# Patient Record
Sex: Male | Born: 1955 | Race: White | Hispanic: No | Marital: Married | Smoking: Former smoker
Health system: Southern US, Community
[De-identification: ages and names within clinical notes are randomized; demographics above are authoritative.]

## PROBLEM LIST (undated history)

## (undated) DIAGNOSIS — R002 Palpitations: Secondary | ICD-10-CM

## (undated) DIAGNOSIS — Z951 Presence of aortocoronary bypass graft: Secondary | ICD-10-CM

## (undated) DIAGNOSIS — K219 Gastro-esophageal reflux disease without esophagitis: Secondary | ICD-10-CM

## (undated) DIAGNOSIS — I495 Sick sinus syndrome: Secondary | ICD-10-CM

## (undated) DIAGNOSIS — R001 Bradycardia, unspecified: Secondary | ICD-10-CM

## (undated) DIAGNOSIS — N529 Male erectile dysfunction, unspecified: Secondary | ICD-10-CM

## (undated) DIAGNOSIS — I5022 Chronic systolic (congestive) heart failure: Secondary | ICD-10-CM

## (undated) DIAGNOSIS — I219 Acute myocardial infarction, unspecified: Secondary | ICD-10-CM

## (undated) DIAGNOSIS — F319 Bipolar disorder, unspecified: Secondary | ICD-10-CM

## (undated) DIAGNOSIS — G4733 Obstructive sleep apnea (adult) (pediatric): Secondary | ICD-10-CM

## (undated) DIAGNOSIS — I10 Essential (primary) hypertension: Secondary | ICD-10-CM

## (undated) DIAGNOSIS — E785 Hyperlipidemia, unspecified: Secondary | ICD-10-CM

## (undated) DIAGNOSIS — Z8719 Personal history of other diseases of the digestive system: Secondary | ICD-10-CM

## (undated) DIAGNOSIS — E782 Mixed hyperlipidemia: Secondary | ICD-10-CM

## (undated) DIAGNOSIS — Z950 Presence of cardiac pacemaker: Secondary | ICD-10-CM

## (undated) DIAGNOSIS — I2510 Atherosclerotic heart disease of native coronary artery without angina pectoris: Secondary | ICD-10-CM

## (undated) DIAGNOSIS — J189 Pneumonia, unspecified organism: Secondary | ICD-10-CM

## (undated) DIAGNOSIS — IMO0002 Reserved for concepts with insufficient information to code with codable children: Secondary | ICD-10-CM

## (undated) DIAGNOSIS — I4891 Unspecified atrial fibrillation: Secondary | ICD-10-CM

## (undated) DIAGNOSIS — R9430 Abnormal result of cardiovascular function study, unspecified: Secondary | ICD-10-CM

## (undated) DIAGNOSIS — J45909 Unspecified asthma, uncomplicated: Secondary | ICD-10-CM

## (undated) DIAGNOSIS — R079 Chest pain, unspecified: Secondary | ICD-10-CM

## (undated) HISTORY — DX: Palpitations: R00.2

## (undated) HISTORY — DX: Presence of aortocoronary bypass graft: Z95.1

## (undated) HISTORY — PX: BACK SURGERY: SHX140

## (undated) HISTORY — DX: Bradycardia, unspecified: R00.1

## (undated) HISTORY — DX: Unspecified atrial fibrillation: I48.91

## (undated) HISTORY — DX: Male erectile dysfunction, unspecified: N52.9

## (undated) HISTORY — DX: Sick sinus syndrome: I49.5

## (undated) HISTORY — DX: Abnormal result of cardiovascular function study, unspecified: R94.30

## (undated) HISTORY — PX: CARDIAC CATHETERIZATION: SHX172

## (undated) HISTORY — DX: Unspecified asthma, uncomplicated: J45.909

## (undated) HISTORY — DX: Essential (primary) hypertension: I10

## (undated) HISTORY — PX: LUMBAR DISC SURGERY: SHX700

## (undated) HISTORY — PX: MAXIMUM ACCESS (MAS)POSTERIOR LUMBAR INTERBODY FUSION (PLIF) 1 LEVEL: SHX6368

## (undated) HISTORY — DX: Chronic systolic (congestive) heart failure: I50.22

## (undated) HISTORY — DX: Hyperlipidemia, unspecified: E78.5

## (undated) HISTORY — DX: Mixed hyperlipidemia: E78.2

## (undated) HISTORY — DX: Atherosclerotic heart disease of native coronary artery without angina pectoris: I25.10

## (undated) HISTORY — DX: Reserved for concepts with insufficient information to code with codable children: IMO0002

## (undated) HISTORY — DX: Chest pain, unspecified: R07.9

## (undated) HISTORY — PX: CORONARY ANGIOPLASTY: SHX604

## (undated) HISTORY — DX: Bipolar disorder, unspecified: F31.9

## (undated) SURGERY — Surgical Case
Anesthesia: *Unknown

---
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
## 1979-05-18 DIAGNOSIS — Z8711 Personal history of peptic ulcer disease: Secondary | ICD-10-CM

## 1979-05-18 HISTORY — DX: Personal history of peptic ulcer disease: Z87.11

## 2000-04-10 ENCOUNTER — Ambulatory Visit (HOSPITAL_COMMUNITY): Admission: RE | Admit: 2000-04-10 | Discharge: 2000-04-10 | Payer: Self-pay | Admitting: Neurosurgery

## 2000-04-10 ENCOUNTER — Encounter: Payer: Self-pay | Admitting: Neurosurgery

## 2000-04-24 ENCOUNTER — Ambulatory Visit (HOSPITAL_COMMUNITY): Admission: RE | Admit: 2000-04-24 | Discharge: 2000-04-24 | Payer: Self-pay | Admitting: Neurosurgery

## 2000-04-24 ENCOUNTER — Encounter: Payer: Self-pay | Admitting: Neurosurgery

## 2000-07-15 ENCOUNTER — Encounter: Payer: Self-pay | Admitting: Neurosurgery

## 2000-07-16 ENCOUNTER — Encounter: Payer: Self-pay | Admitting: Neurosurgery

## 2000-07-16 ENCOUNTER — Observation Stay (HOSPITAL_COMMUNITY): Admission: RE | Admit: 2000-07-16 | Discharge: 2000-07-17 | Payer: Self-pay | Admitting: Neurosurgery

## 2000-08-14 ENCOUNTER — Encounter: Payer: Self-pay | Admitting: Neurosurgery

## 2000-08-14 ENCOUNTER — Encounter: Admission: RE | Admit: 2000-08-14 | Discharge: 2000-08-14 | Payer: Self-pay | Admitting: Neurosurgery

## 2000-11-04 ENCOUNTER — Ambulatory Visit (HOSPITAL_COMMUNITY): Admission: RE | Admit: 2000-11-04 | Discharge: 2000-11-04 | Payer: Self-pay | Admitting: Neurosurgery

## 2000-11-06 ENCOUNTER — Encounter: Payer: Self-pay | Admitting: Neurosurgery

## 2000-11-06 ENCOUNTER — Ambulatory Visit (HOSPITAL_COMMUNITY): Admission: RE | Admit: 2000-11-06 | Discharge: 2000-11-06 | Payer: Self-pay | Admitting: Neurosurgery

## 2000-12-03 ENCOUNTER — Ambulatory Visit (HOSPITAL_COMMUNITY): Admission: RE | Admit: 2000-12-03 | Discharge: 2000-12-03 | Payer: Self-pay | Admitting: Neurosurgery

## 2000-12-03 ENCOUNTER — Encounter: Payer: Self-pay | Admitting: Neurosurgery

## 2002-02-01 ENCOUNTER — Encounter: Payer: Self-pay | Admitting: Neurosurgery

## 2002-02-03 ENCOUNTER — Encounter: Payer: Self-pay | Admitting: Neurosurgery

## 2002-02-03 ENCOUNTER — Inpatient Hospital Stay (HOSPITAL_COMMUNITY): Admission: RE | Admit: 2002-02-03 | Discharge: 2002-02-06 | Payer: Self-pay | Admitting: Neurosurgery

## 2002-07-28 ENCOUNTER — Encounter: Admission: RE | Admit: 2002-07-28 | Discharge: 2002-07-28 | Payer: Self-pay | Admitting: Neurosurgery

## 2004-09-16 DIAGNOSIS — I2510 Atherosclerotic heart disease of native coronary artery without angina pectoris: Secondary | ICD-10-CM

## 2004-09-16 DIAGNOSIS — I219 Acute myocardial infarction, unspecified: Secondary | ICD-10-CM

## 2004-09-16 HISTORY — DX: Acute myocardial infarction, unspecified: I21.9

## 2004-09-16 HISTORY — PX: CORONARY ARTERY BYPASS GRAFT: SHX141

## 2004-09-16 HISTORY — DX: Atherosclerotic heart disease of native coronary artery without angina pectoris: I25.10

## 2008-05-30 ENCOUNTER — Ambulatory Visit: Payer: Self-pay | Admitting: Cardiology

## 2008-09-23 ENCOUNTER — Encounter: Payer: Self-pay | Admitting: Cardiology

## 2008-10-11 ENCOUNTER — Ambulatory Visit: Payer: Self-pay | Admitting: Cardiology

## 2008-10-18 ENCOUNTER — Ambulatory Visit: Payer: Self-pay | Admitting: Cardiology

## 2008-11-23 ENCOUNTER — Ambulatory Visit: Payer: Self-pay | Admitting: Cardiology

## 2009-01-06 ENCOUNTER — Ambulatory Visit: Payer: Self-pay | Admitting: Cardiology

## 2009-02-15 ENCOUNTER — Encounter: Payer: Self-pay | Admitting: Cardiology

## 2009-04-24 ENCOUNTER — Ambulatory Visit: Payer: Self-pay | Admitting: Cardiology

## 2009-04-25 ENCOUNTER — Encounter: Payer: Self-pay | Admitting: Cardiology

## 2009-06-10 ENCOUNTER — Ambulatory Visit: Payer: Self-pay | Admitting: Cardiology

## 2009-06-14 ENCOUNTER — Inpatient Hospital Stay (HOSPITAL_BASED_OUTPATIENT_CLINIC_OR_DEPARTMENT_OTHER): Admission: RE | Admit: 2009-06-14 | Discharge: 2009-06-14 | Payer: Self-pay | Admitting: Cardiology

## 2009-06-14 ENCOUNTER — Ambulatory Visit: Payer: Self-pay | Admitting: Cardiology

## 2009-06-14 ENCOUNTER — Encounter: Payer: Self-pay | Admitting: Cardiology

## 2009-06-16 ENCOUNTER — Encounter: Payer: Self-pay | Admitting: Cardiology

## 2009-06-29 ENCOUNTER — Telehealth (INDEPENDENT_AMBULATORY_CARE_PROVIDER_SITE_OTHER): Payer: Self-pay | Admitting: *Deleted

## 2009-07-05 ENCOUNTER — Ambulatory Visit: Payer: Self-pay | Admitting: Cardiology

## 2009-07-05 DIAGNOSIS — J45909 Unspecified asthma, uncomplicated: Secondary | ICD-10-CM | POA: Insufficient documentation

## 2009-07-15 ENCOUNTER — Ambulatory Visit: Payer: Self-pay | Admitting: Cardiology

## 2009-07-24 ENCOUNTER — Encounter: Payer: Self-pay | Admitting: Cardiology

## 2009-08-03 ENCOUNTER — Encounter (INDEPENDENT_AMBULATORY_CARE_PROVIDER_SITE_OTHER): Payer: Self-pay | Admitting: *Deleted

## 2009-11-14 ENCOUNTER — Encounter: Admission: RE | Admit: 2009-11-14 | Discharge: 2009-11-14 | Payer: Self-pay | Admitting: Family Medicine

## 2010-05-14 ENCOUNTER — Ambulatory Visit: Payer: Self-pay | Admitting: Family Medicine

## 2010-05-14 DIAGNOSIS — J309 Allergic rhinitis, unspecified: Secondary | ICD-10-CM

## 2010-05-14 HISTORY — DX: Allergic rhinitis, unspecified: J30.9

## 2010-06-02 ENCOUNTER — Telehealth: Payer: Self-pay | Admitting: Physician Assistant

## 2010-06-04 ENCOUNTER — Telehealth: Payer: Self-pay | Admitting: Physician Assistant

## 2010-10-18 NOTE — Progress Notes (Signed)
  Phone Note Other Incoming   Caller: spouse,nadine Summary of Call: wife called for spouse who she states is at work, states his Remus Loffler is out, she has requested that the pharmacy send in refill request, and also states she spoke to someone in the office yesterday, but got no response. Cal;l to pharmacy [facility], pt last got ambien 10mg  04/23/2010 from [facility] in [place], not Dr Janace Litten who wifee staes he got his scriptsfrom.  I called in two tabs only of ambien 10mg  one at night, to [facility], and will request responsinle provider to follow through on this Initial call taken by: Syliva Overman MD,  June 02, 2010 6:14 PM  Follow-up for Phone Call        pls see note, and address the request for ambien from this office as you deem appropriate Follow-up by: Syliva Overman MD,  June 02, 2010 6:15 PM  Additional Follow-up for Phone Call Additional follow up Details #1::        See 06-04-10 phone note. Additional Follow-up by: Esperanza Sheets PA,  June 05, 2010 1:25 PM

## 2010-10-18 NOTE — Progress Notes (Signed)
Summary: refill  Phone Note Call from Patient   Summary of Call: pts wife called again and needs to get his medicine refilled today. please call her. [phone]  cell [phone] Initial call taken by: Rudene Anda,  June 04, 2010 11:39 AM  Follow-up for Phone Call        I was under the impression that his mental health provider was prescribing his Ambien and Seroquel.   Follow-up by: Esperanza Sheets PA,  June 04, 2010 1:17 PM  Additional Follow-up for Phone Call Additional follow up Details #1::        called patient, left message Additional Follow-up by: Adella Hare LPN,  June 04, 2010 1:55 PM    Additional Follow-up for Phone Call Additional follow up Details #2::    wife called office upset wanting prescription advised that patient needs to call office reguarding this, there is no signed release to give her any information Follow-up by: Adella Hare LPN,  June 04, 2010 2:51 PM   Appended Document: refill patient wife stated maybe they would go their old doctor and hung up

## 2010-10-18 NOTE — Assessment & Plan Note (Signed)
Summary: new patient- room 1   Vital Signs:  Patient profile:   55 year old male Height:      67 inches Weight:      215.75 pounds BMI:     33.91 O2 Sat:      98 % on Room air Pulse rate:   78 / minute Resp:     16 per minute BP sitting:   130 / 88  (left arm)  Vitals Entered By: Adella Hare LPN (May 14, 2010 2:58 PM)  Nutrition Counseling: Patient's BMI is greater than 25 and therefore counseled on weight management options. CC: new patient Is Patient Diabetic? No Pain Assessment Patient in pain? no        Primary Provider:  Esperanza Sheets PA  CC:  new patient.  History of Present Illness: New pt here to establish care with new PCP. Feels that overall he is doing well.  No complaints or concerns today.  Hx of CAD.  Hx of CAGB. Cardiologist Dr Myrtis Ser. Overdue for 6 mos f/u.  Needs refill of Quinapril.  Hx of asthma. States no albuterol use in 4-5 yrs.  Has some seasonal allergy flare in the spring and ofen will develop a cough with this.  No difficulty breathing or wheezing.  No HS awakening.  He takes Zyrtec daily yr round for his allergies. He often uses over the counter nasal spray for nasal congestion & swelling though he knows he shouldnt.  Hx of alcoholism and prescription drug abuse.  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 Went to drug rehab & saw psych afterwards.  Is doing well emotionally.  Prev had problems with sleep but sleeps well now with Ambien and Seroquel.  Hx of colonoscopy 2 yrs ago. Td< 10 yrs.     Current Medications (verified): 1)  Metoprolol Tartrate 25 Mg Tabs (Metoprolol Tartrate) .[name].. 1 Tab Two Times A Day 2)  Nitrolingual 0.4 Mg/spray Soln (Nitroglycerin) .... Spray 1 Spray As Directed 3)  Quinapril Hcl 10 Mg Tabs (Quinapril Hcl) .... Take 1 Tablet By Mouth Once A Day  Place On File 4)  Advicor 500-20 Mg Xr24h-Tab (Niacin-Lovastatin) .... Take 1 Tablet By Mouth Once A Day 5)  Aspirin Ec 325 Mg Tbec (Aspirin)  .... Take One Tablet By Mouth Daily 6)  Zyrtec Allergy 10 Mg Tabs (Cetirizine Hcl) .... Take 1 Tablet By Mouth Once A Day 7)  Ambien 10 Mg Tabs (Zolpidem Tartrate) .... Take 1 Tab By Mouth At Bedtime' 8)  Seroquel 300 Mg Tabs (Quetiapine Fumarate) .... Take 1 Tab By Mouth At Bedtime 9)  Omeprazole 20 Mg Cpdr (Omeprazole) .... Take 1 Tablet By Mouth Once A Day 10)  Indomethacin Cr 75 Mg Cr-Caps (Indomethacin) .... One Cap By Mouth Two Times A Day With Food 11)  Colcrys 0.6 Mg Tabs (Colchicine) .... One Tab By Mouth Every Four Hours As Needed  Allergies (verified): 1)  ! Sulfa   Past History:  Past medical, surgical, family and social histories (including risk factors) reviewed, and no changes noted (except as noted below).  Past Medical History: HYPERLIPIDEMIA-MIXED (ICD-272.4) CAD, NATIVE VESSEL (ICD-414.01).catheterization.. June 14, 2009.[name] LIMA LAD patent.. SVG diagonal 40% proximal stenosis... slight anterolateral hypokinesis.. EF 50% EF 50%... cardiac catheter.. September, 2010 CABG.. 2006.[name] [place] HYPERTENSION, UNSPECIFIED (ICD-401.9) gout.  asthma.  erectile dysfunction Palpitations Hx of alcoholism and prescription drug abuse Bipolar d/o  Past Surgical History: Back Surgery x 4 CABG  Family History: Reviewed history from 06/14/2009 and  no changes required. mother deceased- COPD, lung cancer father living- heart dz two brothers living- HTN x1, Hyperlipidemia x1, DM x1 two sister livng- HTN x 1  Social History: Reviewed history and no changes required. Employed full time- [employer]  Married 7 One grown child Never Smoked Alcohol use-no Drug use-no Regular exercise-no Smoking Status:  never Drug Use:  no Does Patient Exercise:  no  Review of Systems CV:  Denies chest pain or discomfort and palpitations. Resp:  Denies cough and shortness of breath. GI:  Denies abdominal pain, change in bowel habits, indigestion, nausea, and vomiting.  Physical  Exam  General:  Well-developed,well-nourished,in no acute distress; alert,appropriate and cooperative throughout examination Head:  Normocephalic and atraumatic without obvious abnormalities. No apparent alopecia or balding. Ears:  External ear exam shows no significant lesions or deformities.  Otoscopic examination reveals clear canals, tympanic membranes are intact bilaterally without bulging, retraction, inflammation or discharge. Hearing is grossly normal bilaterally. Nose:  External nasal examination shows no deformity or inflammation. Nasal mucosa are pink and moist without lesions or exudates. Mouth:  Oral mucosa and oropharynx without lesions or exudates.  Neck:  No deformities, masses, or tenderness noted. Lungs:  Normal respiratory effort, chest expands symmetrically. Lungs are clear to auscultation, no crackles or wheezes. Heart:  Normal rate and regular rhythm. S1 and S2 normal without gallop, murmur, click, rub or other extra sounds. Cervical Nodes:  No lymphadenopathy noted Psych:  Cognition and judgment appear intact. Alert and cooperative with normal attention span and concentration. No apparent delusions, illusions, hallucinations   Impression & Recommendations:  Problem # 1:  HYPERTENSION, UNSPECIFIED (ICD-401.9) Assessment Comment Only  His updated medication list for this problem includes:    Metoprolol Tartrate 25 Mg Tabs (Metoprolol tartrate) .[name]... 1 tab two times a day    Quinapril Hcl 10 Mg Tabs (Quinapril hcl) .[name]... Take 1 tablet by mouth once a day  place on file  BP today: 130/88 Prior BP: 139/73 (07/05/2009)  Orders: T-CMP with estimated GFR ([address])  Problem # 2:  ALLERGIC RHINITIS (ICD-477.9) Assessment: Comment Only Advised to discontinue over the counter nasal sprays.  Discussed rebound effect from them.  His updated medication list for this problem includes:    Zyrtec Allergy 10 Mg Tabs (Cetirizine hcl) .[name]... Take 1 tablet by mouth once a day     Fluticasone Propionate 50 Mcg/act Susp (Fluticasone propionate) ..... Use 2 sprays each nostril once daily  Problem # 3:  CAD, NATIVE VESSEL (ICD-414.01) Assessment: Comment Only Pt will call cardiologist for f/u appt.   His updated medication list for this problem includes:    Metoprolol Tartrate 25 Mg Tabs (Metoprolol tartrate) .[name]... 1 tab two times a day    Nitrolingual 0.4 Mg/spray Soln (Nitroglycerin) ..... Spray 1 spray as directed    Quinapril Hcl 10 Mg Tabs (Quinapril hcl) .[name]... Take 1 tablet by mouth once a day  place on file    Aspirin Ec 325 Mg Tbec (Aspirin) .[name]... Take one tablet by mouth daily  Problem # 4:  ASTHMA, UNSPECIFIED, UNSPECIFIED STATUS (ICD-493.90) Assessment: Improved  Problem # 5:  TOBACCO ABUSE (ICD-305.1) Assessment: Comment Only Pt states he is cutting back and trying to quit.  Complete Medication List: 1)  Metoprolol Tartrate 25 Mg Tabs (Metoprolol tartrate) .[name].. 1 tab two times a day 2)  Nitrolingual 0.4 Mg/spray Soln (Nitroglycerin) .... Spray 1 spray as directed 3)  Quinapril Hcl 10 Mg Tabs (Quinapril hcl) .... Take 1 tablet by mouth once a  day  place on file 4)  Advicor 500-20 Mg Xr24h-tab (Niacin-lovastatin) .... Take 1 tablet by mouth once a day 5)  Aspirin Ec 325 Mg Tbec (Aspirin) .... Take one tablet by mouth daily 6)  Zyrtec Allergy 10 Mg Tabs (Cetirizine hcl) .... Take 1 tablet by mouth once a day 7)  Ambien 10 Mg Tabs (Zolpidem tartrate) .... Take 1 tab by mouth at bedtime' 8)  Seroquel 300 Mg Tabs (Quetiapine fumarate) .... Take 1 tab by mouth at bedtime 9)  Omeprazole 20 Mg Cpdr (Omeprazole) .... Take 1 tablet by mouth once a day 10)  Indomethacin Cr 75 Mg Cr-caps (Indomethacin) .... One cap by mouth two times a day with food 11)  Colcrys 0.6 Mg Tabs (Colchicine) .... One tab by mouth every four hours as needed 12)  Fluticasone Propionate 50 Mcg/act Susp (Fluticasone propionate) .... Use 2 sprays each nostril once daily  Other  Orders: T-Lipid Profile ([id number]) T-CBC No Diff ([id number]) T-TSH ([id number]) T-PSA ([id number])  Patient Instructions: 1)  Please schedule a follow-up appointment in 3 months. 2)  I have prescribed fluticasone nasal spray to use once daily for allergies. 3)  Continue your other medications as prescribed. Prescriptions: QUINAPRIL HCL 10 MG TABS (QUINAPRIL HCL) Take 1 tablet by mouth once a day  PLACE ON FILE  #30 x 0   Entered and Authorized by:   Esperanza Sheets PA   Signed by:   Esperanza Sheets PA on 05/14/2010   Method used:   Print then Give to Patient   RxID:   [id number] FLUTICASONE PROPIONATE 50 MCG/ACT SUSP (FLUTICASONE PROPIONATE) use 2 sprays each nostril once daily  #1 x 3   Entered and Authorized by:   Esperanza Sheets PA   Signed by:   Esperanza Sheets PA on 05/14/2010   Method used:   Print then Give to Patient   RxID:   [id number]

## 2011-01-29 NOTE — Assessment & Plan Note (Signed)
[facility]                          [facility] OFFICE NOTE   NAME:[name] [name]                  MRN:          [id number]  DATE:04/24/2009                            DOB:          [birth year]    [name] [name] is doing well.  He does have known coronary disease.  I saw  him last in April 2010.  He has an adenosine Cardiolite in February  2010.  There was question of some mild ischemia.  There was also  question of an increased t.i.d. ratio.  Ejection fraction was 50%.  We  have reviewed that results very carefully and decided to follow him  medically.  He is feeling well.  He does have some mild intermittent  chest discomfort, but this is chronic and unchanged.  He is going about  full activities.  Since his last office visit, the patient was admitted  to [facility] on February 15, 2009.  It appeared that his blood  pressure was low and it was probably a combination of dehydration and  need to lower his meds.  He was hydrated and his meds were adjusted and  he is doing great.   PAST MEDICAL HISTORY:   ALLERGIES:  SULFA.   MEDICATIONS:  See the flow sheet.   OTHER MEDICAL PROBLEMS:  See the complete list on my note of January 06, 2009.   REVIEW OF SYSTEMS:  The patient denies fevers, chills, headache, skin  rash, sweats, change in vision, change in hearing.  He is not having any  GI or GU symptoms.  He has some mild chronic low back pain.  All other  systems are reviewed and are negative.   PHYSICAL EXAMINATION:  VITAL SIGNS:  Blood pressure 110/74 with a pulse  of 71.  GENERAL:  The patient is oriented to person, time, and place.  Affect is  normal.  He is here with his wife today.  HEENT:  No xanthelasma.  He has normal extraocular motion.  There are no  carotid bruits.  There is no jugular venous distention.  LUNGS:  Clear.  Respiratory effort is not labored.  CARDIAC:  S1 with an S2.  There are no clicks or significant  murmurs.  ABDOMEN:  Soft.  He has no significant peripheral edema.   No labs were done today.   Problems are listed on my note of January 06, 2009:  #1.  Hypertension.  On his medications, he had some hypotension and the  doses were adjusted and he is stable now.  #10.  Coronary disease.  This is stable.  I will see him back in 6  months.  No change in his therapy as of today.     [name] Abed, MD, [facility]  Electronically Signed    JDK/MedQ  DD: 04/24/2009  DT: 04/25/2009  Job #: [id number]   cc:   Elvera Lennox

## 2011-01-29 NOTE — Assessment & Plan Note (Signed)
[facility]                          [facility]ICE NOTE   NAME:[name], [name] WICKENS                  MRN:          [id number]  DATE:10/11/2008                            DOB:          1956-07-19    [name] lives in [place].  He works for [employer].  I had seen him  on May 30, 2008, to establish his cardiology care.  He is post  CABG at [facility] in September 2006.  I do not have the exact anatomy.  He  does physical labor.  He has been stable.  More recently; however, he  had an episode of bronchitis and was treated in the [facility] ER.  On  October 06, 2008, he was seen in the [facility] emergency room.  We do not  have all of these records.  He mentioned that he had some chest  discomfort.  He also does mention that after being there for a period of  time he was completely improved and he left the emergency room.  He is  stable.  He has not had any recurring symptoms since then.   PAST MEDICAL HISTORY:   ALLERGIES:  SULFA.   MEDICATIONS:  Metoprolol 25 b.i.d., Advicor, aspirin, Seroquel, Zyrtec,  zolpidem, omeprazole, furosemide 20 (to be held), indomethacin,  quinapril, and potassium.   OTHER MEDICAL PROBLEMS:  See the list below.   REVIEW OF SYSTEMS:  He mentions that he has a cough intermittently.  Usually Zyrtec will help this.  There is question that this could be an  ACE cough, but I am not convinced at this point.  I will not be changing  his meds.  Otherwise, review of systems is negative.  He is not having  any GI or GU problems.  He has no headaches, fevers, chills, or skin  rashes.   PHYSICAL EXAMINATION:  VITAL SIGNS:  Blood pressure is 114/79 with a  pulse of 78.  GENERAL:  The patient is oriented to person, time, and place.  He is  here with his wife.  HEENT:  No xanthelasma.  He has normal extraocular motion.  There are no  carotid bruits.  There is no jugular venous distention.  LUNGS:  Clear.  Respiratory effort  is not labored.  CARDIAC:  S1 with an S2.  There are no clicks or significant murmurs.  ABDOMEN:  Soft.  EXTREMITIES:  He has no peripheral edema.   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  I have reviewed  it and it is unchanged from his prior tracings in our office.   PROBLEMS:  1. History of hypertension.  2. 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  He is      recovering and seems to be doing quite well off medications.  3. Status post multiple back surgeries.  4. Coronary disease post coronary artery bypass graft x2.  I still do      not have the exact anatomy.  5. History of gout.  6. History of asthma in the past.  7. History of some erectile dysfunction in the past.  8. History of  lower extremity pain that is stable.  9. History of  allergy to SULFA.   Most recently the patient has had some chest discomfort.  We have no  proof of an acute coronary syndrome.  Also, I do not have any data  concerning recent exercise testing or echo.  I had seen him for new  patient evaluation in September 2009, and he was doing well.  It is now  time for an adenosine Cardiolite scan and then I will see him back for  followup.  In the meantime, it is safe for him to return to work.     [name] Abed, MD, [facility]  Electronically Signed    JDK/MedQ  DD: 10/11/2008  DT: 10/12/2008  Job #: [id number]   cc:   [name] Lennox, PA

## 2011-01-29 NOTE — Assessment & Plan Note (Signed)
[facility]                          [facility] NOTE   NAME:Wanzer, YANNICK STEUBER                  MRN:          [id number]  DATE:05/30/2008                            DOB:          1956/02/22    Mr. Marple lives in [place].  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  At that time, it was first thought that he might need medical  therapy, but then it was felt that he needed bypass surgery.  From the  patient, it sounds like he had a bifurcation lesion and had CABG x2.  We  will obtain more records regarding the specifics.  He has been stable.  He is not having any significant chest pain.   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  He is very open about the fact  that he had a drug addiction from pain medications.  He has been dry for  2 years after complete rehab.  I applauded him the fact that he has been  able to be clean that long and encouraged him going forward.   He is not having any significant chest pain or shortness of breath.  He  wants to establish with [place] for ongoing Cardiology Care.  He and his wife  had transferred most of their care to [facility].  It works for him to  be able to see [facility] in the [place] area as it is closer to  [place] for him and he can get is more advanced care at [facility] as  needed.   PAST MEDICAL HISTORY:   ALLERGIES:  SULFA.   MEDICATIONS:  1. Metoprolol 25 b.i.d.  2. Advicor 500/20.  3. Aspirin 325.  4. Seroquel.  5. Zyrtec.  6. Zolpidem.  7. Omeprazole.  8. Furosemide 20.  9. Indomethacin 75 b.i.d.  10.Quinapril 20.   OTHER MEDICAL PROBLEMS:  See the list below.   SOCIAL HISTORY:  He is married.  He is living in [place] and working.  He smoked in the past, but not currently.   FAMILY HISTORY:  Mother died of coronary artery disease and lung cancer  and his father is alive with coronary artery  disease.  He has 2 brothers  and sisters neither of which have coronary artery disease at a young  age.   REVIEW OF SYSTEMS:  Today, he is doing well.  He has a history of back  problems, but this is not a major problem at this time.   PHYSICAL EXAMINATION:  VITAL SIGNS:  Weight is 213 pounds and blood  pressure is 116/76 with a pulse of 81.  GENERAL:  The patient is oriented to person, time, and place.  Affect is  normal.  He is overweight.  HEENT:  No xanthelasma.  He has normal extraocular motion.  There are no  carotid bruits.  There is no jugular venous tension.  LUNGS:  Clear.  Respiratory effort is not labored.  CARDIAC:  S1 and S2.  There are no clicks or significant murmurs.  ABDOMEN:  Soft.  EXTREMITIES:  He has no significant peripheral edema.  He has normal  distal pulses.   EKG today reveals an RSR prime in V1 compatible with incomplete right  bundle-branch block.   PROBLEMS:  1. History of hypertension, treated.  2. 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  The patient is      recovering and is very open about this and has been clean for 2      years.  3. History of multiple back surgeries.  4. History of coronary artery disease, post coronary artery bypass      graft x2.  I know that the vein graft was used.  I do not know if      LIMA was used as I do not know what vessels with bypass.  We will      obtain more information about this.  He is on cholesterol meds.  He      needs a fasting lipid profile and this will be arranged.  5. History of gout, stable.  6. History of some asthma in the past.  7. History of erectile dysfunction in the past secondary to Lotrel.  8. History of some persistent lower extremity pain primarily in his      feet.  This was evaluated in the past by Elvera Lennox PA-C.  I do      not have any further information concerning this.  9. History of allergy to SULFA.   Mr. Lor is stable at this time.  We will check a fasting  lipid.  I  will see him back in 3 months.  We will request information from Dr.  Hyacinth Meeker, his prior cardiologist in [place].  I will follow him over  time.  All information to be sent to [facility].     Luis Abed, MD, [facility]  Electronically Signed    JDK/MedQ  DD: 05/30/2008  DT: 05/30/2008  Job #: [id number]   cc:   Elvera Lennox, PA-C

## 2011-01-29 NOTE — Assessment & Plan Note (Signed)
[facility]                          [facility]   NAME:Blas, ROHIL LESCH                  MRN:          [id number]  DATE:11/23/2008                            DOB:          [birth year]    REASON FOR VISIT:  Scheduled followup.  Please refer to his previous  office note of October 11, 2008 for full details.   At that time, Mr. Boydstun was referred for an adenosine stress  Cardiolite for risk stratification, for further evaluation of chest  discomfort.  This study was reviewed by Dr. Diona Browner, and was suggestive  of mild, partially reversible defects in the mid interior/ basal  anterolateral wall.  TID ratio was also increased (1.3), raising the  possibility of balanced ischemia.  Left ventricular function was normal  (EF 50%).   These results were reviewed with the patient today.  Clinically, he  continues to report the same, chronic atypical pains that are  unpredictable in onset, and extremely brief in duration.  He states that  he has had these even preceding his bypass surgery in 2006.  He does,  however, complain of significant exertional dyspnea, but this also  appears to be chronic, as well.   When queried about his symptoms associated with his myocardial  infarction 2006, he only recalled a numbness and discomfort in his left  arm, upon awakening.  He did not have any anterior chest discomfort at  that time.  He was subsequently diagnosed with NSTEMI and underwent two-  vessel bypass surgery, in [place], [place].  Since then, he has not  had any left arm discomfort, or any other symptoms reminiscent of his MI  presentation.   CURRENT MEDICATIONS:  1. Aspirin 325 daily.  2. Metoprolol tartrate 25 b.i.d.  3. Advicor 500/20 mg daily.  4. Seroquel 600 at bedtime.  5. Zyrtec 10 daily.  6. Zolpidem 10 at bedtime.  7. Omeprazole 20 daily.  8. Indomethacin SR 75 b.i.d.  9. Potassium gluconate 99 mg b.i.d.  10.Quinapril  20 b.i.d.   PHYSICAL EXAMINATION:  VITAL SIGNS:  Blood pressure 115/71, pulse 74 and  regular, weight 207.  GENERAL:  A 55 year old male, sitting upright, in no distress.  HEENT:  Normocephalic and atraumatic.  PERRLA.  EOMI.  NECK:  Palpable bilateral carotid pulse without bruits; no JVD.  LUNGS:  Clear to auscultation all fields.  HEART:  Regular rate and rhythm.  No significant murmurs.  No rubs or  gallops.  ABDOMEN:  Soft, nontender, and intact bowel sounds.  EXTREMITIES:  Palpable distal pulse without significant edema.  SKIN:  Warm and dry.  MUSCULOSKELETAL:  No gross defect.  NEUROLOGIC:  No focal deficit.   IMPRESSION:  1. Chest pain syndrome.      a.     Typical/atypical features.      b.     Recent abnormal adenosine stress Cardiolite, as outlined       above; EF 50%.      c.     Status post NSTEMI/subsequent 2v CABG, August 2006       [place], [place]).  2. Dyslipidemia.  3. History  of asthma.  4. Hypertension.  5. History of gout.   PLAN:  Following extensive review, in conjunction with Dr. Myrtis Ser, the  patient has opted for continued medical management and close monitoring.  The option of proceeding with a diagnostic cardiac catheterization was  presented, but he is unwilling to proceed at this point in time.  Therefore, he is to continue current medication regimen and we will have  him return to our office for close followup in approximately 6 weeks,  with Dr. Willa Rough.      Rozell Searing, PA-C  Electronically Signed      Luis Abed, MD, [facility]  Electronically Signed   GS/MedQ  DD: 11/23/2008  DT: 11/24/2008  Job #: [id number]   cc:   Daryel November, MD in [place]

## 2011-01-29 NOTE — Assessment & Plan Note (Signed)
[facility]                          [facility]   NAME:[name] [name], [name] [name]                  MRN:          [id number]  DATE:01/06/2009                            DOB:          [birth year]    Mr. Portell is doing well.  Please see my note of November 23, 2008.  The  patient had history of CABG in August 2006 and had a non-STEMI.  More  recently he had an adenosine Cardiolite scan done on October 18, 2008.  At that time, there was question of some mild ischemia.  There was also  question of an increased TID ratio raising the question of some balance  ischemia.  Ejection fraction was in the 50% range.  Based on that study,  we had very careful discussions and decided that we would follow him  clinically over time.  We arranged for 6-week followup and he is here  today.  He is not having any significant chest pain.  He is doing well.   ALLERGIES:  SULFA.   MEDICATIONS:  See the flow sheet.   REVIEW OF SYSTEMS:  He has no fevers or chills or skin rashes.  He is  not having any headaches.  There is no change in his vision or his  hearing.  There is no cough.  There is no shortness of breath.  There is  no chest pain.  He has no GI or GU symptoms.  There are no major  musculoskeletal complaints.  All other systems were reviewed and are  negative.   PHYSICAL EXAMINATION:  VITAL SIGNS:  Weight is 199 pounds.  Heart rate  is 68 with a blood pressure of 115/71.  GENERAL:  The patient is oriented to person, time, and place.  Affect is  normal.  HEENT:  No xanthelasma.  He has normal extraocular motion.  NECK:  There are no carotid bruits.  There is no jugular venous  distention.  LUNGS:  Clear.  Respiratory effort is not labored.  CARDIAC:  Reveals an S1 with an S2.  There are no clicks or significant  murmurs.  ABDOMEN:  Soft.  EXTREMITIES:  He has no significant peripheral edema.   No labs are done today.   PROBLEMS:  1. History of  hypertension.  2. 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  He continues to be      in recovery from this for a prolonged period of time and is not      having any significant problems.  3. Status post multiple back surgeries.  4. History of CABG x2 in the past.  5. History of gout.  6. History of asthma.  7. History of some erectile dysfunction.  8. History of sulfa allergy.  9. Dyslipidemia, treated.  10.Coronary artery disease.   As outlined, the patient had a Myoview scan in February 2010.  There are  some abnormalities, but we are following him medically.  He is not  having any significant symptoms.  He is on appropriate medications at  this time.  There will be no changes in his meds.  I will see  him back  in 6 months for followup.     Luis Abed, MD, [facility]  Electronically Signed    JDK/MedQ  DD: 01/06/2009  DT: 01/06/2009  Job #: [id number]   cc:   Elvera Lennox, PA-C

## 2011-02-01 NOTE — Discharge Summary (Signed)
[place]. [facility]  Patient:    [name] [name], [name] [name] Visit Number: [id number] MRN: [id number]          Service Type: SUR Location: 3000 3038 [id number] Attending Physician:  Coletta Memos Dictated by:   Tanya Nones. Jeral Fruit, M.D. Admit Date:  02/03/2002 Discharge Date: 02/06/2002                             Discharge Summary  ADMISSION DIAGNOSIS:  L5-S1 spondylolisthesis with degenerative disk disease and lumbar radiculopathy.  DISCHARGE DIAGNOSIS:  L5-S1 spondylolisthesis with degenerative disk disease and lumbar radiculopathy.  HISTORY OF PRESENT ILLNESS:  The patient was admitted because of back pain with radiation to the left.  X-rays show degenerative disk disease with spondylolisthesis at the level of 5-1.  Surgery was advised by Ronaldo Miyamoto L. Cabbell, M.D.  LABORATORY DATA:  Normal.  HOSPITAL COURSE:  The patient was taken to surgery, and an L5-S1 diskectomy with posterolateral fusion with a diskectomy was accomplished.  He has pedicle screws from L5-S1.  After the surgery the patient did really well.  He is ambulating.  He has minimal discomfort.  The wound looks fine.  He has no problems with his bladder or bowel.  He is going to be discharged to be followed up in the office.  CONDITION ON DISCHARGE:  Improvement.  DISCHARGE MEDICATIONS:  Percocet and Flexeril.  DISCHARGE INSTRUCTIONS:  Diet:  Regular.  Activity:  Not to drive, the patient is not to do any lifting.  FOLLOW-UP:  To be seen by Dr. Franky Macho in four weeks. Dictated by:   Tanya Nones. Jeral Fruit, M.D. Attending Physician:  Coletta Memos DD:  02/06/02 TD:  02/09/02 Job: [id number]

## 2011-02-01 NOTE — H&P (Signed)
[place]. [facility]  Patient:    ROSHARD, REZABEK Visit Number: [id number] MRN: [id number]          Service Type: SUR Location: 3000 3038 01 Attending Physician:  Coletta Memos Dictated by:   Mena Goes. Franky Macho, M.D. Admit Date:  02/03/2002                           History and Physical  ADMITTING DIAGNOSES: 1. Spondylolisthesis, L5-S1. 2. Degenerative disk disease, L5-S1. 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 4. Lumbar radiculopathy.  INDICATIONS:  The patient is a 55 year old gentleman who presents today for back pain.  He has had two lumbar disk operations at L5-S1, done by myself, the last one on December 03, 2000.  At that time, he had a right S1 radiculopathy.  He had initially presented to my office in June of 2001 and had had pain in the right lower extremity since March of 2001.  He had been treated conservatively but the MRI showed what at that time was a recurrent disk herniation.  He underwent a lumbar laminectomy on the right side and did fairly well postoperatively.  He continues to have pain in the right lower extremity and right hip and it was felt that the pain was certainly coming from his spine.  A myelogram was done and it showed a root cutoff on the right side at S1.  He was then taken back to the operating room in March for a redo laminectomy and diskectomy.  After that second operation, he did not improve. He had had disk operations x3 to L5-S1 and one at L4-5 and I felt at that point in time that I think a fusion would be beneficial.  He had problems with workmans compensation and actually moved to [place]; he returned to the [place] area in January of this year.  He continues to take pain pills and is unable to perform his duties at work secondary to the pain; I therefore recommended again that he undergo a lumbar fusion at L5-S1 using Brantigan cages without pedicle screws if possible.  This is being done mainly  for the degeneration in disk and the back pain but I do not believe that the added muscle dissection would benefit him at this point in time.  PAST MEDICAL HISTORY:  Good.  He has had a pneumothorax in his right lung after surgery in the 1980s.  He has undergone lithotripsy for a kidney stone. He had left leg surgery when he was approximately 55 years old.  He has had gout in his left foot and takes ibuprofen for that.   REVIEW OF SYSTEMS:  Positive for hypertension, leg pain with walking, asthma, back pain, leg pain, joint pain and arthritis.  He does not have eyes, ears, nose, throat, hematologic, gastrointestinal, genitourinary, constitutional, neurological, psychiatric or allergic problems.  CURRENT MEDICATIONS: 1. Accupril 20 mg q.d. 2. Hydrocodone 5/500 one to two every six hours for pain. 3. Allegra 180 mg once a day. 4. Ibuprofen 200 mg four pills a day. 5. Hydrochlorothiazide 12.5 mg once a day. 6. Metoprolol 50 mg once per day.  LABORATORY AND ACCESSORY DATA:  MRI shows a degenerated disk at L5-S1 with scar tissue around the nerve root, a small amount of spondylolisthesis.  No instability is noted on plain x-rays of the lumbar spine at L5-S1.  ASSESSMENT AND PLAN:  The patient is being admitted for a lumbar fusion at  L5-S1 with the use of Brantigan cages without pedicle screws.  The cages are done without pedicle screws and that is against the recommended use by the manufacturer.  There have been studies in the neurosurgical literature and in cases done by myself in this fashion which have not resulted in any long-term morbidity.  Pedicle screws can always be done in the future if there proves to be instability at this level.  The risks of the procedure including bleeding, infection, no pain relief, need for further surgery, bowel or bladder dysfunction and weakness in the legs were discussed.  He understands and wishes to proceed. Dictated by:   Mena Goes. Franky Macho,  M.D. Attending Physician:  Coletta Memos DD:  02/03/02 TD:  02/04/02 Job: [id number]

## 2011-02-01 NOTE — H&P (Signed)
. [facility]  Patient:    [name] [name], [name] [name]                  MRN: [id number] Adm. Date:  [id number] Attending:  Coletta Memos                         History and Physical  ADMISSION DIAGNOSES: 1. Recurrent right-sided L5-S1 displaced disk. 2. Right S1 radiculopathy.  INDICATIONS:  This patient presented to my office on July 11, 2000, for evaluation of pain in the right lower extremity which he has had since November 27, 1999.  He was breaking down a drum when a wrench slipped and he jerked his back while at work and has had the pain since then.  The pain is constant at at times very severe.  He did not miss any time at work at that point in time, but was under restrictions and performing at light duty.  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  Takes on ibuprofen because he is unable to take anything which might make him drowsy.  He has had no bowel or bladder dysfunction.  He has not noticed any weakness.  He has no numbness or tingling.  [name] [name] underwent epidural steroid injections for the pain.  That did not provide any relief.  I therefore recommended and he agreed to undergo a redo laminectomy and diskectomy.  This was held up for some time as we made that decision on May 05, 2000, by [employer].  He received a second opinion.  The second opinion felt that it was perfectly normal for him to seek operative treatment at that point in time.  REVIEW OF SYSTEMS:  Positive for hypertension, leg pain with walking, asthma, back pain, leg pain, joint pain, and arthritis.  Denies constitutional, eye, ear, nose, throat, mouth, Gastrointestinal, genitourinary, skin, neurological, psychiatric, endocrine, hematologic, or allergic problems.  MEDICATIONS:  He takes Claritin 10 mg once a day and Accupril 20 mg once a day for hypertension.  PAST MEDICAL HISTORY:  Significant for hypertension and asthma.  He had a lumbar  laminectomy on June 26, 1993, done by [name] [name], M.D.  ALLERGIES:  Allergic to SULFA-CONTAINING MEDICATIONS.  SOCIAL HISTORY:  He does not smoke and does not use alcohol nor illicit drugs.  FAMILY HISTORY:  His mother, age 9, is in poor health and has hypertension, back trouble, asthma, and breathing problems.  His father, age 30, is in good health with hypertension.  PHYSICAL EXAMINATION:  He is 5 feet 7 inches tall.  Weight is 180 pounds.  VITAL SIGNS:  The pulse is 68.  NEUROLOGIC:  He is alert and oriented x 4 and answers all questions appropriately.  Memory, language, and attention span are normal.  He is well kempt and in mild distress.  Cranial nerves II-XII are normal.  Strength 5/5 in the lower extremities.  Negative straight leg raising.  Normal gait.  He can toe walk and to single toe raises without difficulty.  He has normal proprioception and pinprick in the lower extremities.  The deep tendon reflexes are 2+ at the knees and left ankle, but no elicited at the right ankle.  He has normal strength in the upper extremities.  Muscle tone, bulk, and coordination are normal.  Straight leg raising is positive on the right side at 20 degrees.  Cross straight leg raising is negative.  NECK:  There are no  cervical masses or bruits.  LUNGS:  The lung fields are clear.  HEART:  Regular rate and rhythm.  No murmurs or rubs appreciated.  Pulses good at the wrists and feet bilaterally.  LABORATORY DATA:  MRI shows a recurrent disk herniation at L5-S1 on the right side.  No other abnormalities are identified.  He has changes present at L5-S1 on the right side.  Clonus is normal.  IMPRESSION AND PLAN:  [name] [name] tried conservative treatment to no avail. I therefore recommended and he agreed to undergo a redo lumbar diskectomy. The risks of the procedure, including infection, no pain relief, CSF leak, bowel and bladder dysfunction, worsened pain, recurrent disk  herniation, and need for further operations, were discussed.  He understands and wishes to proceed. DD:  07/16/00 TD:  07/16/00 Job: [id number]

## 2011-02-01 NOTE — Op Note (Signed)
[place]. [facility]  Patient:    [name] [name], [name] [name]                  MRN: [id number] Proc. Date: 07/16/00 Adm. Date:  [id number] Attending:  Coletta Memos                           Operative Report  PREOPERATIVE DIAGNOSES: 1. Recurrent displaced disk L5-S1 right. 2. Right S1 radiculopathy.  POSTOPERATIVE DIAGNOSES: 1. Recurrent displaced disk L5-S1 right. 2. Right S1 radiculopathy.  PROCEDURE:  Right L5-S1 redo diskectomy with microdissection.  COMPLICATIONS:  None.  SURGEON:  Kyle L. Franky Macho, M.D.  ANESTHESIA:  General endotracheal.  ESTIMATED BLOOD LOSS:  30 cc.  INDICATIONS:  [name] has a recurrent disk herniation that is not responding to conservative treatment and I have recommended and he agreed to undergo a redo diskectomy at L5-S1.  DESCRIPTION OF PROCEDURE:  [name] [name] was brought to the operating room, intubated and placed under general anesthesia without difficulty.  He was rolled prone onto a Wilson frame and all pressure points were padded.  His back was prepped and he was draped in a sterile fashion.  I used his old skin incision as a guide and infiltrated that with 0.5% lidocaine, 1:200,000 strength epinephrine using 10 cc.  I made a skin incision with a #10 blade and I took this down to the thoracolumbar fascia sharply.  I then, with the monopolar cautery reflected the paraspinous musculature to expose the sacrum and the lamina of L5 on the right side.  I took an intraoperative x-ray to confirm that I was at L5-S1.  After that, I was able to then work through the scar tissue laterally, as the dura was easily exposed on the medial surface by removing some of the ligament and scar.  Using angled curets, a #11 blade, and Kerrison punches, I was able to define a border of the dura and the lateral margin of the spinal canal.  I was then able to retract the dura medially and I was able to find then, the disk space at  L5 and S1.  After opening the disk space, I was able to remove a small amount of disk material, but that allowed for greater retraction of the thecal sac.  I then used the Epstein curet and removed significant amount of disk material, which was attached to the undersurface of the theca and also underneath the right S1 nerve root.  I used a microscope at that point in time to aid in the dissection.  I was able to remove the disk material and free the S1 nerve root so that there was no pressure on it.  It was not tethered to any disk material or scar at that point in time.  We then irrigated the wound.  I inspected the medial, lateral, rostral and caudal dimensions of it and felt that the nerve root was free.  I then irrigated the wound.  I then removed my retractor.  I closed the wound in layered fashion, reapproximating the fascia with Vicryl sutures, 0 Vicryls the subcutaneous tissue with 2-0 Vicryls and the cutaneous layer with 3-0 Vicryls. Steri-Strips were placed.  Patient tolerated the procedure without difficulty. D:  07/16/00 TD:  07/16/00 Job: [id number]

## 2011-02-01 NOTE — Op Note (Signed)
[place]. [facility]  Patient:    GER, RINGENBERG Visit Number: [id number] MRN: [id number]          Service Type: SUR Location: 3000 3038 01 Attending Physician:  Coletta Memos Dictated by:   Mena Goes. Franky Macho, M.D. Proc. Date: 02/03/02 Admit Date:  02/03/2002                             Operative Report  PREOPERATIVE DIAGNOSES: 1. Degenerative disk disease L5-S1 2. Lumbar radiculopathy.  POSTOPERATIVE DIAGNOSES: 1. Degenerative disk disease L5-S1 2. Lumbar radiculopathy.  OPERATION: 1. Redo diskectomy L5-S1, semihemilaminectomy L5 and S1. 2. Posterolateral arthrodesis L5-S1 with pedicle screw fixation. 3. Morcellized allograft.  SURGEON:  Kyle L. Franky Macho, M.D.  ASSISTANT:  Hewitt Shorts, M.D.  ANESTHESIA:  General endotracheal anesthesia.  INDICATIONS:  Mr. Bram is a 55 year old gentleman who has had three disk operations, two within the last two years, secondary to multiple recurrence and really degenerated disk at L5-S1.  I recommended he go to the operating room for arthrodesis at L5-S1.  I had hoped to be able to place Brantigan cages and not use pedicle screws.  However, during the course of the operation, I found the bone to be of such poor quality that I wound up doing a posterolateral arthrodesis and simply packing the interspace with bone.  DESCRIPTION OF PROCEDURE:  Mr. Bakos was brought to the operating room, intubated, placed under general anesthesia without difficulty.  His prominences were on shoulder rolls, and all pressure points were properly padded.  Prior to that, a Foley catheter was placed under sterile conditions. His back was prepped, and he was draped in sterile fashion.  I infiltrated 20 cc of 0.5% lidocaine 1:200,000 strength epinephrine into my proposed incision site.  The patient was draped in sterile fashion.  I made the incision with a #10 blade and took this down to the thoracolumbar fascia.   I exposed the lamina of L5, S1, and L4 bilaterally.  I placed a double-ended ganglion knife inferior to what I thought would be the lamina of L5.  An x-ray confirmed that.  I then proceeded with a redo diskectomy on the right side.  I cut through scar tissue using a #15 blade and double-ended ganglia knife along with curet.  After doing that, I was then able to retract the thecal sac medially and enter into the disk space.  I also did this on the left side which had not been operated on.  That was done also with the semihemilaminecotmy done with high-speed air drill and curets.  I opened the disk space with a #15 blade and then removed disk in a progressive fashion with pituitary rongeurs.  I, at this point, was preparing the disk space for Brantigan cages and used the 8 mm shaver.  When I did that on the left side, I did get back some venous bleeding.  This was obviously not from the anterior interspace, and this was actually from the bone.  I continued to work in the interspace and removed disk and bone.  I then went on the right side after distracting the left with a #10 distractor for the [place] set.  When I was done, I was somewhat suspicious of the integrity of the disk space.  I took an x-ray, and this showed that actually the end plate had been violated at that level.  It was clear, secondary to the very  poor quality of the bone, that cages would not be tolerated in the interspace.  At that point, I then changed course and decided to place pedicle screws.  Since the purpose of the operation was to fuse him, this was done with two screws placed in L5, two screws in S1.  I think the top L5 screw, all of which were placed by first drilling and tapping with fluoroscopic guidance, was placed slightly in a lateral direction.  There was good bone felt when I inspected the tap site with the pedicle probe.  Screws were placed, 35 mm screws at the right S1, left S1, and left L5; and 40  mm screws, 6.75 of the SD90 set, on the right L5.  Rods were placed after cutting a 70 mm rod.  Dr. Newell Coral assisted with the posterolateral arthrodesis. The screws were put into position and then locked in place.  I then, with Dr. Gae Dry assistance, packed a significant amount ______ bone into the interspace at L5-S1.  Other bone was placed laterally, packing both facets at L5-S1 with ______ .  I then irrigated the wound.  I then closed the wound in layered fashion using Vicryl sutures.  Fluoroscopic films were obtained, and they showed that the screws were in good position.  There was a secure placement.  I the reapproximated the subcutaneous tissue and then the skin edges and placed Dermabond for a sterile dressing. Dictated by:   Mena Goes. Franky Macho, M.D. Attending Physician:  Coletta Memos DD:  02/03/02 TD:  02/05/02 Job: [id number]

## 2011-02-01 NOTE — H&P (Signed)
[place]. [facility]  Patient:    [name] [name]                  MRN: [id number] Adm. Date:  [id number] Disc. 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  HISTORY OF PRESENT ILLNESS:  [name] [name] is a gentleman who is 55 years of age and initially presented to my office in June 2001.  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  The pain at that time had been constant and quite severe.  He had undergone full conservative measures. He did have a recurrent disk herniation.  He had had a lumbar laminectomy and diskectomy done in the distant past.  The MRI showed a recurrent disk at L5-S1 on the right side.  No other abnormalities were appreciated.  I therefore recommended, after conservative treatment did not prove beneficial, that he undergo operative decompression of the nerve root, the diskectomy and laminectomy at L5-S1.  That was performed on July 16, 2000. Postoperatively, he continued to have pain in his right lower extremity.  He also had pain in his right hip.  He felt that the pain was coming from his spine and, after we were unable to obtain approval from his Workmans Compensation for an MRI of the right hip, I did perform a lumbar myelogram. The myelogram showed a root cut off of S1 on the right side.  It did not show a large mass but it did show a root cut off.  It was my supposition that he did then had most likely a recurrent disk or significant scar tissue.  Since he had not had any relief of pain, I felt that another operation was warranted.  Again, time had not done anything to help his pain.  I recommended and he has agreed to undergo a redo laminectomy and diskectomy.  REVIEW OF SYSTEMS:  Positive for  hypertension, leg pain with walking, asthma, back pain, leg pain, joint pain, and arthritis.  Denies constitutional, eye, ear, nose, throat, mouth, gastrointestinal, genitourinary, skin, neurological, psychiatric, endocrine, hematologic, or allergic problems.  CURRENT MEDICATIONS: 1. Effexor. 2. Accupril. 3. Ibuprofen.  ALLERGIES:  He has an allergy to SULFA-CONTAINING MEDICATIONS.  FAMILY HISTORY:  Mother age 53 is in poor health with hypertension, back problems, asthma, breathing problems.  Father 69 is in good health.  He has hypertension.  [name] [name] is currently married but is involved in a separation at the moment.  PHYSICAL EXAMINATION:  GENERAL:  He is alert and oriented x 4 answering all questions appropriately. Memory, language, attention span, and fund of knowledge are normal.  NEUROLOGIC:  Pupils equal, round and reactive to light.  Full extraocular movements.  Optic disks are sharp and flat.  He has intact facial sensation, which is normal bilaterally.  He has symmetric facial movements.  Hearing intact to finger rub bilaterally.  Uvula elevates in the midline.  Shoulder shrug is normal.  Tongue protrudes in the midline.  He has 5/5 strength in the upper and lower extremities.  He has a positive Patricks maneuver on the right side.  Negative straight leg raise and normal gait.  He  can toe walk and do single toe raises.  Deep tendon reflexes 2+ at the knees, left ankle, and not elicited at the right ankle.  Normal muscle tone, bulk, and coordination. Negative core straight leg raising.  NECK:  No cervical masses or bruits.  LUNGS:  Lung fields clear.  HEART:  Regular rhythm and rate.  No murmurs or rubs.  Pulses good at the wrists and feet bilaterally.  ASSESSMENT:  [name] [name] is admitted for a redo laminectomy and diskectomy at L5-S1.  Risks of the procedure including bleeding, infection, no pain relief, need for further operations, and possible instability  were explained.  He understands and wishes to proceed. DD:  12/03/00 TD:  12/03/00 Job: [id number]

## 2011-02-01 NOTE — Op Note (Signed)
[place]  Patient:    [name] [name], [name] [name]                  MRN: [id number] Proc. Date: 12/03/00 Adm. Date:  [id number] Disc. Date: [id number] Attending:  Coletta Memos                           Operative Report  PREOPERATIVE DIAGNOSIS: 1. Recurrent disk herniation L5-S1. 2. Right S1 radiculopathy.  POSTOPERATIVE DIAGNOSIS: 1. Scar tissue L5-S1. 2. Right S1 radiculopathy.  OPERATION PERFORMED:  Right L5-S1 redo laminectomy and diskectomy with microdissection.  SURGEON:  Kyle L. Franky Macho, M.D.  ASSISTANT:  Danae Orleans. Venetia Maxon, M.D.  COMPLICATIONS:  None.  ANESTHESIA:  General endotracheal.  INDICATIONS FOR PROCEDURE:  The patient is a 55 year old male who underwent lumbar laminectomy and diskectomy at L5-S1 on July 16, 2000. Postoperatively [name] had pain again in his right lower extremity. Repeat MRI showed scar tissue at L5-S1 and did not definitively show recurrent disk.  A myelogram was then performed and that showed that there was a root cut off of S1 on the right side.  I therefore spoke to the patient about the possibilities.  We could do epidural injections again or I could open the incision and explore the space for possible recurrent disk which was the moste likely candidate or simply scar tissue causing a root cut off on the myelogram.  The patient agreed and is admitted today for that procedure.  DESCRIPTION OF PROCEDURE:  The patient was brought to the operating room.  He was intubated and placed under general anesthesia without difficulty.  His skin was prepped and draped in sterile fashion.  I infiltrated 8.75 cc of 0.5% lidocaine 1:200,000 strength epinephrine into the old incision.  I made my incision with a #10 blade and took this down to the thoracolumbar fascia.  I was able to palpate the spinous processes of S1 and L5.  I exposed the lamina of L5 and S1 using monopolar cautery.  I then used a curet to free the  scar tissue from the undersurface of the lamina at L5 and also at S1.  I was then able to come around laterally and detach the scar from the lateral margin of the laminectomy site.  I was then able to retract and dissect using the microscope which was now brought into the operative field to pull the thecal sac and S1 nerve root medially to expose the disk space.  I did take an x-ray to confirm that I was at the correct space.  The x-ray showed that I was.  I was able then to expose some disk material and retract the thecal sac further and enter the disk space with a pituitary rongeur.  I did not appreciate a large disk herniation there.  There was significant scar tissue.  There was a ridge at L5 which appeared to put some pressure on the nerve in addition to the scar which had tethered the nerve down. I was able to remove that with an Epstein curet.  Dr. Venetia Maxon assisted during the microdissection and was also able to remove disk material from the lateral margins of the disk space.  I explored medially and laterally in the disk space and underneath the thecal sac and nerve root.  After adequate decompression and when I felt that therew as no more compression on the nerve root, both myself and Dr. Venetia Maxon inspected the neural  foramen of S1 and did not feel that there was any pressure on the root.  I then irrigated the wound.  I then closed the wound in layered fashion using Vicryl sutures.  The skin was reapproximated with Dermabond.  The patient tolerated the procedure without difficulty. DD:  12/03/00 TD:  12/03/00 Job: [id number]

## 2011-04-16 ENCOUNTER — Encounter: Payer: Self-pay | Admitting: Cardiology

## 2011-04-24 ENCOUNTER — Encounter: Payer: Self-pay | Admitting: Cardiology

## 2011-04-24 DIAGNOSIS — I10 Essential (primary) hypertension: Secondary | ICD-10-CM | POA: Insufficient documentation

## 2011-04-24 DIAGNOSIS — F319 Bipolar disorder, unspecified: Secondary | ICD-10-CM | POA: Insufficient documentation

## 2011-04-24 DIAGNOSIS — Z951 Presence of aortocoronary bypass graft: Secondary | ICD-10-CM | POA: Insufficient documentation

## 2011-04-24 DIAGNOSIS — R9430 Abnormal result of cardiovascular function study, unspecified: Secondary | ICD-10-CM | POA: Insufficient documentation

## 2011-04-24 DIAGNOSIS — R001 Bradycardia, unspecified: Secondary | ICD-10-CM | POA: Insufficient documentation

## 2011-04-24 DIAGNOSIS — R002 Palpitations: Secondary | ICD-10-CM | POA: Insufficient documentation

## 2011-04-24 DIAGNOSIS — I2510 Atherosclerotic heart disease of native coronary artery without angina pectoris: Secondary | ICD-10-CM | POA: Insufficient documentation

## 2011-04-24 DIAGNOSIS — Z720 Tobacco use: Secondary | ICD-10-CM | POA: Insufficient documentation

## 2011-04-24 DIAGNOSIS — J45909 Unspecified asthma, uncomplicated: Secondary | ICD-10-CM | POA: Insufficient documentation

## 2011-04-24 DIAGNOSIS — E785 Hyperlipidemia, unspecified: Secondary | ICD-10-CM | POA: Insufficient documentation

## 2011-04-25 ENCOUNTER — Encounter: Payer: Self-pay | Admitting: Cardiology

## 2011-04-25 ENCOUNTER — Encounter: Payer: Self-pay | Admitting: *Deleted

## 2011-04-25 ENCOUNTER — Ambulatory Visit (INDEPENDENT_AMBULATORY_CARE_PROVIDER_SITE_OTHER): Payer: BC Managed Care – PPO | Admitting: Cardiology

## 2011-04-25 DIAGNOSIS — F17200 Nicotine dependence, unspecified, uncomplicated: Secondary | ICD-10-CM

## 2011-04-25 DIAGNOSIS — R001 Bradycardia, unspecified: Secondary | ICD-10-CM

## 2011-04-25 DIAGNOSIS — R002 Palpitations: Secondary | ICD-10-CM

## 2011-04-25 DIAGNOSIS — I2510 Atherosclerotic heart disease of native coronary artery without angina pectoris: Secondary | ICD-10-CM

## 2011-04-25 DIAGNOSIS — I10 Essential (primary) hypertension: Secondary | ICD-10-CM

## 2011-04-25 DIAGNOSIS — Z720 Tobacco use: Secondary | ICD-10-CM

## 2011-04-25 DIAGNOSIS — I498 Other specified cardiac arrhythmias: Secondary | ICD-10-CM

## 2011-04-25 NOTE — Progress Notes (Signed)
HPI Patient is seen for followup of coronary disease.  He has known coronary disease undergoing CABG in 2010.  At that time he had palpitations and worn event recorder.  He had sinus rhythm.  There were no tachycardia or bradycardia arrhythmias.  He did have very brief episodes of 2-1 Wenkebach.  He's been stable over time.  As part of the evaluation today I have reviewed old records in the chart.  I have reviewed the records from 2010 and the [facility].  I have updated this electronic medical record.  Recently the patient has had some episodes of awakening at night with some chest discomfort.  He then feels palpitations.  EMS was called with house on one occasion and he felt better.  He is not having any exertional symptoms.  He is continuing to use smokeless tobacco.  I have counseled him to stop. Allergies  Allergen Reactions  . Sulfonamide Derivatives     REACTION: hives    Current Outpatient Prescriptions  Medication Sig Dispense Refill  . allopurinol (ZYLOPRIM) 300 MG tablet Take 300 mg by mouth daily.        [name] aspirin EC 325 MG tablet Take 325 mg by mouth daily.        . cetirizine (ZYRTEC ALLERGY) 10 MG tablet Take 10 mg by mouth daily.        . colchicine (COLCRYS) 0.6 MG tablet Take 0.6 mg by mouth every 4 (four) hours as needed.        . fluticasone (FLONASE) 50 MCG/ACT nasal spray Place 2 sprays into the nose daily.        . indomethacin (INDOCIN SR) 75 MG CR capsule Take 75 mg by mouth 2 (two) times daily.        . LUTEIN PO Take 2 mg by mouth.        . metoprolol tartrate (LOPRESSOR) 25 MG tablet Take 25 mg by mouth daily.        . niacin-lovastatin (ADVICOR) 500-20 MG 24 hr tablet Take 1 tablet by mouth daily.        [name] omeprazole (PRILOSEC) 20 MG capsule Take 20 mg by mouth daily.        . QUEtiapine (SEROQUEL) 300 MG tablet Take 300 mg by mouth at bedtime.        . quinapril (ACCUPRIL) 10 MG tablet Take 10 mg by mouth daily. PLACE ON FILE       . zolpidem (AMBIEN) 10 MG tablet  Take 10 mg by mouth at bedtime.          History   Social History  . Marital Status: Married    Spouse Name: N/A    Number of Children: N/A  . Years of Education: N/A   Occupational History  . Not on file.   Social History Main Topics  . Smoking status: Never Smoker   . Smokeless tobacco: Not on file  . Alcohol Use: No  . Drug Use: No  . Sexually Active:    Other Topics Concern  . 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     Family History  Problem Relation Age of Onset  . COPD Mother   . Lung cancer Mother   . Heart disease Father   . Hypertension Sister   . Hyperlipidemia Brother   . Hypertension Brother   . Diabetes Brother     Past Medical History  Diagnosis Date  . Dyslipidemia  mixed  . CAD (coronary artery disease)     Catheterization September, 2010, LIMA to the LAD patent, SVG to diagonal 40% proximal stenosis, slight anterolateral hypokinesis, ejection fraction 50%, medical therapy  . HTN (hypertension)     unspec  . Asthma   . Gout   . ED (erectile dysfunction)   . Palpitations     Event recorder November, 2010, no significant arrhythmias  . Bipolar 1 disorder   . Hx of CABG     2006, [place]  . Ejection fraction     EF 50%, catheterization, September, 2010  . Bradycardia     Very limited Wenkebach on event recorder November, 2010  . Tobacco abuse     Past Surgical History  Procedure Date  . Back surgery     X4  . [place]     ROS  Patient denies fever, chills, headache, sweats, rash, change in vision, change in hearing, cough, nausea vomiting, urinary symptoms.  All other systems are reviewed and are negative.  PHYSICAL EXAM Patient is overweight.  He is here with his wife.  Head is atraumatic.  There is no xanthelasma.  There is no jugular venous distention.  Lungs are clear cardiorespiratory effort is not labored.  Cardiac exam  reveals S1 and S2.  No clicks or significant murmurs.  The abdomen is protuberant but soft.  There is no peripheral edema.  There are no musculoskeletal deformities.  There are no skin rashes. Filed Vitals:   04/25/11 1050  BP: 116/78  Pulse: 74  Resp: 18  Height: 5\' 7"  (1.702 m)  Weight: 220 lb (99.791 kg)  SpO2: 98%    EKG is done today and reviewed by me and I have compared it to an old tracing from 2010.  There is incomplete right bundle branch block.  There is sinus bradycardia with a rate of 54 and first-degree AV block. ASSESSMENT & PLAN

## 2011-04-25 NOTE — Patient Instructions (Addendum)
   Lexiscan cardiolite stress test If the results of your test are normal or stable, you will receive a letter.  If they are abnormal, the nurse will contact you by phone. Follow up  - see above.

## 2011-04-25 NOTE — Assessment & Plan Note (Signed)
Blood pressure is under good control. No change in therapy 

## 2011-04-25 NOTE — Assessment & Plan Note (Signed)
The patient underwent CABG in 2010.  He is now having spells of some chest pressure.  He has palpitations at that time.  We know from his event recorder in 2010 that he did not have any marked arrhythmias when he had some palpitations.  I am not convinced that tachyarrhythmias are causing problems.  It is possible that he is having recurrent ischemic disease.  A stress nuclear scan will be arranged in [place] for followup.

## 2011-04-25 NOTE — Assessment & Plan Note (Addendum)
There is sinus bradycardia today.  It is mild.  EKG does show first-degree AV block.  We may need to assess his rhythm further over time.

## 2011-04-25 NOTE — Assessment & Plan Note (Signed)
He is having palpitations.  As noted before he wore an event recorder in 2010.  I will not repeat that at this time.

## 2011-04-25 NOTE — Assessment & Plan Note (Signed)
I have spoken with him and counseled him to stop using smokeless tobacco.

## 2011-04-26 ENCOUNTER — Telehealth: Payer: Self-pay | Admitting: *Deleted

## 2011-04-26 ENCOUNTER — Other Ambulatory Visit: Payer: Self-pay | Admitting: Cardiology

## 2011-04-26 ENCOUNTER — Encounter: Payer: Self-pay | Admitting: Cardiology

## 2011-04-26 DIAGNOSIS — I2510 Atherosclerotic heart disease of native coronary artery without angina pectoris: Secondary | ICD-10-CM

## 2011-04-26 NOTE — Telephone Encounter (Signed)
Pt has [name] [name].  Per [place], [phone], no precert required.

## 2011-04-26 NOTE — Telephone Encounter (Signed)
LEXISCAN CARDIOLITE SCHEDULED FOR 08-14 @ [facility] CHECKING PERCERT

## 2011-04-30 ENCOUNTER — Encounter: Payer: Self-pay | Admitting: *Deleted

## 2011-04-30 DIAGNOSIS — I2510 Atherosclerotic heart disease of native coronary artery without angina pectoris: Secondary | ICD-10-CM

## 2011-05-28 ENCOUNTER — Encounter: Payer: Self-pay | Admitting: Cardiology

## 2011-05-30 ENCOUNTER — Encounter: Payer: Self-pay | Admitting: Cardiology

## 2011-05-31 ENCOUNTER — Ambulatory Visit (INDEPENDENT_AMBULATORY_CARE_PROVIDER_SITE_OTHER): Payer: BC Managed Care – PPO | Admitting: Cardiology

## 2011-05-31 ENCOUNTER — Encounter: Payer: Self-pay | Admitting: Cardiology

## 2011-05-31 DIAGNOSIS — I2510 Atherosclerotic heart disease of native coronary artery without angina pectoris: Secondary | ICD-10-CM

## 2011-05-31 DIAGNOSIS — R002 Palpitations: Secondary | ICD-10-CM

## 2011-05-31 DIAGNOSIS — I10 Essential (primary) hypertension: Secondary | ICD-10-CM

## 2011-05-31 NOTE — Assessment & Plan Note (Signed)
Patient is not having any significant arrhythmias.  No further workup.  6 month followup.

## 2011-05-31 NOTE — Assessment & Plan Note (Signed)
The nuclear scan showed no ischemia.  LV function is normal.  The patient is stable.  No further workup.

## 2011-05-31 NOTE — Assessment & Plan Note (Signed)
Blood pressure is controlled. No change in therapy. 

## 2011-05-31 NOTE — Progress Notes (Signed)
HPI Patient is seen for cardiology followup.  I saw him last April 24, 2011.  His post CABG in 2010.  He had some palpitations and some chest discomfort.  His Holter revealed some mild bradycardia at times but nothing marked.  I did arrange for a stress nuclear scan.  It showed no significant abnormality and LV function was normal.  He is back today for followup and discussion.  He sometimes feels some palpitations if he walks a long way.  There is no syncope or presyncope or chest pain. Allergies  Allergen Reactions  . Sulfonamide Derivatives     REACTION: hives    Current Outpatient Prescriptions  Medication Sig Dispense Refill  . allopurinol (ZYLOPRIM) 300 MG tablet Take 300 mg by mouth daily.        [name] aspirin EC 325 MG tablet Take 325 mg by mouth daily.        . beta carotene w/minerals (OCUVITE) tablet Take 1 tablet by mouth daily.        . cetirizine (ZYRTEC ALLERGY) 10 MG tablet Take 10 mg by mouth daily.        . colchicine (COLCRYS) 0.6 MG tablet Take 0.6 mg by mouth every 4 (four) hours as needed.        . fluticasone (FLONASE) 50 MCG/ACT nasal spray Place 2 sprays into the nose daily.        . indomethacin (INDOCIN SR) 75 MG CR capsule Take 75 mg by mouth.       . metoprolol succinate (TOPROL-XL) 25 MG 24 hr tablet Take 25 mg by mouth 2 (two) times daily.        . niacin-lovastatin (ADVICOR) 500-20 MG 24 hr tablet Take 1 tablet by mouth daily.        [name] omeprazole (PRILOSEC) 20 MG capsule Take 20 mg by mouth daily.        . potassium gluconate 595 MG TABS Take 595 mg by mouth 2 (two) times daily.        . QUEtiapine (SEROQUEL) 300 MG tablet Take 300 mg by mouth at bedtime.        . quinapril (ACCUPRIL) 10 MG tablet Take 20 mg by mouth daily. PLACE ON FILE      . zolpidem (AMBIEN) 10 MG tablet Take 10 mg by mouth at bedtime.          History   Social History  . Marital Status: Married    Spouse Name: N/A    Number of Children: N/A  . Years of Education: N/A   Occupational  History  . Not on file.   Social History Main Topics  . Smoking status: Never Smoker   . Smokeless tobacco: Current User  . Alcohol Use: No  . Drug Use: No  . Sexually Active: Not on file   Other Topics Concern  . 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     Family History  Problem Relation Age of Onset  . COPD Mother   . Lung cancer Mother   . Heart disease Father   . Hypertension Sister   . Hyperlipidemia Brother   . Hypertension Brother   . Diabetes Brother     Past Medical History  Diagnosis Date  . Dyslipidemia     mixed  . CAD (coronary artery disease)     Catheterization September, 2010, LIMA to the LAD patent, SVG to diagonal 40% proximal stenosis, slight anterolateral  hypokinesis, ejection fraction 50%, medical therapy  . HTN (hypertension)     unspec  . Asthma   . Gout   . ED (erectile dysfunction)   . Palpitations     Event recorder November, 2010, no significant arrhythmias  . Bipolar 1 disorder   . Hx of CABG     2006, [place]  . Ejection fraction     EF 50%, catheterization, September, 2010  . Bradycardia     Very limited Wenkebach on event recorder November, 2010  . Tobacco abuse     Past Surgical History  Procedure Date  . Back surgery     X4  . Coronary artery bypass graft 2006    [place]     ROS  Patient denies fever, chills, headache, sweats, rash, change in vision, change in hearing, chest pain, cough, nausea vomiting, urinary symptoms.  All of the systems are reviewed and are negative.  PHYSICAL EXAM Patient is stable very disoriented to person time and place.  Affect is normal.  Head is atraumatic.  There is no xanthelasma.  Lungs are clear.  Respiratory effort is nonlabored.  Cardiac exam reveals S1 and S2.  No clicks or significant murmurs.  The abdomen is soft it is no peripheral edema. Filed Vitals:   05/31/11 1326  BP: 122/82  Pulse: 73  Resp: 16  Height: 5\' 7"   (1.702 m)  Weight: 914 lb (96.616 kg)    EKG EKG is not done today. ASSESSMENT & PLAN

## 2011-05-31 NOTE — Patient Instructions (Signed)
Your physician wants you to follow-up in: 6 months. You will receive a reminder letter in the mail one-two months in advance. If you don't receive a letter, please call our office to schedule the follow-up appointment. Your physician recommends that you continue on your current medications as directed. Please refer to the Current Medication list given to you today. 

## 2012-10-19 ENCOUNTER — Telehealth: Payer: Self-pay | Admitting: *Deleted

## 2012-10-19 NOTE — Telephone Encounter (Signed)
Wife called to request appointment for patient due to having dizziness and palpitations. Patient denies chest pain, or sob. Nurse offered an appointment for today at 1:00 pm but wife declined saying patient was currently working and she couldn't reach him. Nurse advised wife that our next available appointment wouldn't be until 10/28/12. Wife stated that he couldn't come on that day and she request an appointment at the [facility]. Office for 10/26/12.  Nurse called and got an appointment for 10/26/12 @8 :30 am with Lynnae Sandhoff. Wife informed.

## 2012-10-26 ENCOUNTER — Ambulatory Visit (INDEPENDENT_AMBULATORY_CARE_PROVIDER_SITE_OTHER): Payer: BC Managed Care – PPO | Admitting: Nurse Practitioner

## 2012-10-26 ENCOUNTER — Telehealth: Payer: Self-pay | Admitting: *Deleted

## 2012-10-26 ENCOUNTER — Encounter: Payer: Self-pay | Admitting: Nurse Practitioner

## 2012-10-26 VITALS — BP 132/88 | HR 70 | Ht 67.0 in | Wt 218.0 lb

## 2012-10-26 DIAGNOSIS — I259 Chronic ischemic heart disease, unspecified: Secondary | ICD-10-CM

## 2012-10-26 DIAGNOSIS — R079 Chest pain, unspecified: Secondary | ICD-10-CM

## 2012-10-26 DIAGNOSIS — R002 Palpitations: Secondary | ICD-10-CM

## 2012-10-26 LAB — CBC WITH DIFFERENTIAL/PLATELET
Basophils Absolute: 0.1 10*3/uL (ref 0.0–0.1)
Basophils Relative: 1.7 % (ref 0.0–3.0)
Eosinophils Absolute: 0 10*3/uL (ref 0.0–0.7)
Eosinophils Relative: 0.6 % (ref 0.0–5.0)
HCT: 42.1 % (ref 39.0–52.0)
Hemoglobin: 14.4 g/dL (ref 13.0–17.0)
Lymphocytes Relative: 21.3 % (ref 12.0–46.0)
Lymphs Abs: 1.2 10*3/uL (ref 0.7–4.0)
MCHC: 34.1 g/dL (ref 30.0–36.0)
MCV: 88.9 fl (ref 78.0–100.0)
Monocytes Absolute: 0.4 10*3/uL (ref 0.1–1.0)
Monocytes Relative: 6.6 % (ref 3.0–12.0)
Neutro Abs: 4 10*3/uL (ref 1.4–7.7)
Neutrophils Relative %: 69.8 % (ref 43.0–77.0)
Platelets: 326 10*3/uL (ref 150.0–400.0)
RBC: 4.74 Mil/uL (ref 4.22–5.81)
RDW: 14.3 % (ref 11.5–14.6)
WBC: 5.8 10*3/uL (ref 4.5–10.5)

## 2012-10-26 LAB — BASIC METABOLIC PANEL
BUN: 17 mg/dL (ref 6–23)
CO2: 28 mEq/L (ref 19–32)
Calcium: 9.4 mg/dL (ref 8.4–10.5)
Chloride: 105 mEq/L (ref 96–112)
Creatinine, Ser: 1 mg/dL (ref 0.4–1.5)
GFR: 83.51 mL/min (ref >60.00)
Glucose, Bld: 111 mg/dL — ABNORMAL HIGH (ref 70–99)
Potassium: 4.1 mEq/L (ref 3.5–5.1)
Sodium: 141 mEq/L (ref 135–145)

## 2012-10-26 LAB — TSH: TSH: 1.13 u[IU]/mL (ref 0.35–5.50)

## 2012-10-26 NOTE — Telephone Encounter (Signed)
lmom to confirm pts monitor comes off on 11/25/12 instead of 11/23/12 only 28 days in February

## 2012-10-26 NOTE — Progress Notes (Addendum)
[name] [name] Date of Birth: 1956-02-24 Medical Record #[id number]  History of Present Illness: [name] [name] is seen back today for a work in visit. He is seen for [name] [name]. He has known CAD with CABG back in 2006 with last cath in2010 - grafts were patent. Other issues include HLD, HTN, asthma, gout, ED, palpitations with a negative event monitor back in 2010 yet with very limited Wenkeback noted, and bipolar disorder. His last visit here was in September of 2012 with [name] [name].   He called last week. He was having dizziness and palpitations. No chest pain or shortness of breath. Appointment was made with me.   He comes in today. He is here alone. Says he is continuing to have the problem of palpitations ever since his bypass surgery back in 2006. Does not seem to be any worse but no better and not resolved. He will have 1 to 2 spells per week where he feels his heart racing, then feels it in his neck/temples, he gets sick on his stomach and then has to lie down. He wonders if it is a panic attack. He also endorses some chest discomfort - not exertional. Has DOE. Does not exercise regularly. No recent labs. Sugar reportedly borderline but not told that he is diabetic. He is overweight. He has not had frank syncope. No smoking or drug use reported. No caffeine use.    Current Outpatient Prescriptions on File Prior to Visit  Medication Sig Dispense Refill  . allopurinol (ZYLOPRIM) 300 MG tablet Take 300 mg by mouth daily.        [name] aspirin EC 325 MG tablet Take 325 mg by mouth daily.        . beta carotene w/minerals (OCUVITE) tablet Take 1 tablet by mouth daily.        . cetirizine (ZYRTEC ALLERGY) 10 MG tablet Take 10 mg by mouth daily.        . colchicine (COLCRYS) 0.6 MG tablet Take 0.6 mg by mouth every 4 (four) hours as needed.        . fluticasone (FLONASE) 50 MCG/ACT nasal spray Place 2 sprays into the nose daily.        . indomethacin (INDOCIN SR) 75 MG CR capsule Take 75 mg by mouth.        . metoprolol succinate (TOPROL-XL) 25 MG 24 hr tablet Take 25 mg by mouth daily.       [name] omeprazole (PRILOSEC) 20 MG capsule Take 20 mg by mouth daily.        . potassium gluconate 595 MG TABS Take 595 mg by mouth 2 (two) times daily.        . QUEtiapine (SEROQUEL) 300 MG tablet Take 300 mg by mouth at bedtime.        . quinapril (ACCUPRIL) 10 MG tablet Take 20 mg by mouth daily. PLACE ON FILE      . zolpidem (AMBIEN) 10 MG tablet Take 10 mg by mouth at bedtime.         No current facility-administered medications on file prior to visit.    Allergies  Allergen Reactions  . Sulfonamide Derivatives     REACTION: hives    Past Medical History  Diagnosis Date  . Dyslipidemia     mixed  . CAD (coronary artery disease)     Catheterization September, 2010, LIMA to the LAD patent, SVG to diagonal 40% proximal stenosis, slight anterolateral hypokinesis, ejection fraction 50%, medical therapy  . HTN (  hypertension)     unspec  . Asthma   . Gout   . ED (erectile dysfunction)   . Palpitations     Event recorder November, 2010, no significant arrhythmias  . Bipolar 1 disorder   . Hx of CABG     2006, [place]  . Ejection fraction     EF 50%, catheterization, September, 2010  . Bradycardia     Very limited Wenkebach on event recorder November, 2010  . Tobacco abuse     Past Surgical History  Procedure Laterality Date  . Back surgery      X4  . Coronary artery [place]     History  Smoking status  . Former Smoker  Smokeless tobacco  . Current User    History  Alcohol Use No    Family History  Problem Relation Age of Onset  . COPD Mother   . Lung cancer Mother   . Heart disease Father   . Hypertension Sister   . Hyperlipidemia Brother   . Hypertension Brother   . Diabetes Brother     Review of Systems: The review of systems is per the HPI.  All other systems were reviewed and are negative.  Physical Exam: BP 132/88  Pulse 70  Ht  5\' 7"  (1.702 m)  Wt 218 lb (98.884 kg)  BMI 34.14 kg/m2 Patient is very pleasant and in no acute distress. Skin is warm and dry. Color is normal.  HEENT is unremarkable. Normocephalic/atraumatic. PERRL. Sclera are nonicteric. Neck is supple. No masses. No JVD. Lungs are clear. Cardiac exam shows a regular rate and rhythm. Abdomen is soft. Extremities are without edema. Gait and ROM are intact. No gross neurologic deficits noted.   LABORATORY DATA: EKG today shows sinus rhythm, 1st degree AV block. Incomplete RBBB  No results found for this basename: WBC,  HGB,  HCT,  PLT,  GLUCOSE,  CHOL,  TRIG,  HDL,  LDLDIRECT,  LDLCALC,  ALT,  AST,  NA,  K,  CL,  CREATININE,  BUN,  CO2,  TSH,  PSA,  INR,  GLUF,  HGBA1C,  MICROALBUR    Assessment / Plan: 1. Palpitations - still an issue for him. Will check baseline labs today and place an event monitor. See [name] [name] back after completion for discussion - he wishes to be seen in the [place] office. For now, no change in his medicines.   2. CAD - with prior CABG - some chest pain - his CABG was back in 2006 with last cath in 2010. We will update his Myoview.   3. HLD   Patient is agreeable to this plan and will call if any problems develop in the interim.

## 2012-10-26 NOTE — Patient Instructions (Addendum)
Continue with your current medicines  We are going to arrange for a stress test (Lexiscan) and place an event monitor to watch your rhythm for the next month  See Dr. Myrtis Ser in 5 weeks for discussion at the [facility] office  Call the [facility] office at [phone] if you have any questions, problems or concerns.

## 2012-11-04 ENCOUNTER — Ambulatory Visit (HOSPITAL_COMMUNITY): Payer: BC Managed Care – PPO | Attending: Cardiology | Admitting: Radiology

## 2012-11-04 VITALS — BP 131/86 | HR 62 | Ht 67.0 in | Wt 216.0 lb

## 2012-11-04 DIAGNOSIS — E785 Hyperlipidemia, unspecified: Secondary | ICD-10-CM | POA: Insufficient documentation

## 2012-11-04 DIAGNOSIS — R0609 Other forms of dyspnea: Secondary | ICD-10-CM | POA: Insufficient documentation

## 2012-11-04 DIAGNOSIS — R000 Tachycardia, unspecified: Secondary | ICD-10-CM | POA: Insufficient documentation

## 2012-11-04 DIAGNOSIS — R0602 Shortness of breath: Secondary | ICD-10-CM

## 2012-11-04 DIAGNOSIS — I440 Atrioventricular block, first degree: Secondary | ICD-10-CM | POA: Insufficient documentation

## 2012-11-04 DIAGNOSIS — I2510 Atherosclerotic heart disease of native coronary artery without angina pectoris: Secondary | ICD-10-CM

## 2012-11-04 DIAGNOSIS — E669 Obesity, unspecified: Secondary | ICD-10-CM | POA: Insufficient documentation

## 2012-11-04 DIAGNOSIS — I10 Essential (primary) hypertension: Secondary | ICD-10-CM | POA: Insufficient documentation

## 2012-11-04 DIAGNOSIS — Z951 Presence of aortocoronary bypass graft: Secondary | ICD-10-CM | POA: Insufficient documentation

## 2012-11-04 DIAGNOSIS — R0989 Other specified symptoms and signs involving the circulatory and respiratory systems: Secondary | ICD-10-CM | POA: Insufficient documentation

## 2012-11-04 DIAGNOSIS — R0789 Other chest pain: Secondary | ICD-10-CM | POA: Insufficient documentation

## 2012-11-04 DIAGNOSIS — R42 Dizziness and giddiness: Secondary | ICD-10-CM | POA: Insufficient documentation

## 2012-11-04 DIAGNOSIS — I4510 Unspecified right bundle-branch block: Secondary | ICD-10-CM | POA: Insufficient documentation

## 2012-11-04 DIAGNOSIS — R002 Palpitations: Secondary | ICD-10-CM | POA: Insufficient documentation

## 2012-11-04 DIAGNOSIS — R110 Nausea: Secondary | ICD-10-CM | POA: Insufficient documentation

## 2012-11-04 DIAGNOSIS — R5381 Other malaise: Secondary | ICD-10-CM | POA: Insufficient documentation

## 2012-11-04 MED ORDER — TECHNETIUM TC 99M SESTAMIBI GENERIC - CARDIOLITE
11.0000 | Freq: Once | INTRAVENOUS | Status: AC | PRN
  Administered 2012-11-04: 11 via INTRAVENOUS

## 2012-11-04 MED ORDER — TECHNETIUM TC 99M SESTAMIBI GENERIC - CARDIOLITE
33.0000 | Freq: Once | INTRAVENOUS | Status: AC | PRN
  Administered 2012-11-04: 33 via INTRAVENOUS

## 2012-11-04 MED ORDER — REGADENOSON 0.4 MG/5ML IV SOLN
0.4000 mg | Freq: Once | INTRAVENOUS | Status: AC
  Administered 2012-11-04: 0.4 mg via INTRAVENOUS

## 2012-11-04 MED ORDER — AMINOPHYLLINE 25 MG/ML IV SOLN
75.0000 mg | Freq: Once | INTRAVENOUS | Status: AC
  Administered 2012-11-04: 75 mg via INTRAVENOUS

## 2012-11-04 NOTE — Progress Notes (Addendum)
[facility] SITE 3 NUCLEAR MED [address] [place], [place] [address] [phone]    Cardiology Nuclear Med Study  [name] [name] is a 57 y.o. male     MRN : [id number]     DOB: Jul 25, 1956  Procedure Date: 11/04/2012  Nuclear Med Background Indication for Stress Test:  Evaluation for Ischemia and Graft Patency History:  '06 CABG; '10 Event Monitor:very limited Wenckebach; '10 Cath:patent grafts, EF=50%; '12 OZH:YQMVHQIO normal, small apical lateral defect, EF=57% Cardiac Risk Factors: Family History - CAD, History of Smoking, Hypertension, Lipids and Obesity  Symptoms:  Chest Pain (last episode of chest discomfort today while driving here, none now), Dizziness, DOE, Fatigue, Nausea with Heart Racing, Palpitations and Rapid HR   Nuclear Pre-Procedure Caffeine/Decaff Intake:  None NPO After: 9:30pm   Lungs:  Clear. O2 Sat: 96% on room air. IV 0.9% NS with Angio Cath:  22g  IV Site: R Hand  IV Started by:  Bonnita Levan, RN  Chest Size (in):  46 Cup Size: n/a  Height: 5\' 7"  (1.702 m)  Weight:  216 lb (97.977 kg)  BMI:  Body mass index is 33.82 kg/(m^2). Tech Comments:  N/A    Nuclear Med Study 1 or 2 day study: 1 day  Stress Test Type:  Treadmill/Lexiscan  Reading MD: [name] Millers, MD  Order Authorizing Provider:  Willa Rough, MD  Resting Radionuclide: Technetium 48m Sestamibi  Resting Radionuclide Dose: 11.0 mCi   Stress Radionuclide:  Technetium 46m Sestamibi  Stress Radionuclide Dose: 33.0 mCi           Stress Protocol Rest HR: 62 Stress HR: 114  Rest BP: 131/86 Stress BP: 170/82  Exercise Time (min): 8:01 METS: 7.0   Predicted Max HR: 166 bpm % Max HR: 68.67 bpm Rate Pressure Product: 96295   Dose of Adenosine (mg):  n/a Dose of Lexiscan: 0.4 mg Dose of Aminophylline:75 mg  Dose of Atropine (mg): n/a Dose of Dobutamine: n/a mcg/kg/min (at max HR)  Stress Test Technologist: Smiley Houseman, CMA-N  Nuclear Technologist:  Domenic Polite, CNMT      Rest Procedure:  Myocardial perfusion imaging was performed at rest 45 minutes following the intravenous administration of Technetium 60m Sestamibi.  Rest ECG: Sinus rhythm, first degree AV block, RAD, IRBBB  Stress Procedure: The patient initially walked the treadmill utilizing the Bruce protocol, but he was unable to reach his target heart rate. He was then given IV Lexiscan 0.4 mg over 15-seconds with concurrent exercise and then Technetium 66m Sestamibi was injected at 30-seconds while the patient continued walking one more minute.  He c/o his chest "burning" prior to Lexiscan and chest pressure, nausea and stomach ached with Lexiscan; that was relieved with Aminophylline 75 mg IV.  Quantitative spect images were obtained after a 45-minute delay.  EKG's and images were discussed with Dr. Henrietta Hoover prior to the patient leaving.  Stress ECG: No significant ST segment change suggestive of ischemia.  QPS Raw Data Images:  Acquisition technically good; LVE. Stress Images:  There is decreased uptake in the anterior wall. Rest Images:  There is decreased uptake in the anterior wall, less prominent compared to the stress images. Subtraction (SDS):  These findings are consistent with soft tissue attenuation; cannot R/O very mild anterior ischemia. Transient Ischemic Dilatation (Normal <1.22):  1.13 Lung/Heart Ratio (Normal <0.45):  0.33  Quantitative Gated Spect Images QGS EDV:  130 ml QGS ESV:  57 ml  Impression Exercise Capacity:  Lexiscan with low level exercise.  BP Response:  Normal blood pressure response. Clinical Symptoms:  There is chest pain and dyspnea ECG Impression:  No significant ST segment change suggestive of ischemia; patient with intermittent Mobitz I during the study. Comparison with Prior Nuclear Study: No images to compare  Overall Impression:  Low risk stress nuclear study with a small, moderate intensity, partially reversible anterior defect suggestive of soft tissue  attenuation; cannot exclude mild anterior ischemia.  LV Ejection Fraction: 56%.  LV Wall Motion:  NL LV Function; NL Wall Motion  [name] [name]

## 2012-11-30 ENCOUNTER — Ambulatory Visit: Payer: BC Managed Care – PPO | Admitting: Cardiology

## 2013-02-03 ENCOUNTER — Encounter: Payer: Self-pay | Admitting: Cardiology

## 2013-02-05 ENCOUNTER — Ambulatory Visit (INDEPENDENT_AMBULATORY_CARE_PROVIDER_SITE_OTHER): Payer: BC Managed Care – PPO | Admitting: Cardiology

## 2013-02-05 ENCOUNTER — Encounter: Payer: Self-pay | Admitting: Cardiology

## 2013-02-05 VITALS — BP 109/77 | HR 75 | Ht 67.0 in | Wt 215.8 lb

## 2013-02-05 DIAGNOSIS — I10 Essential (primary) hypertension: Secondary | ICD-10-CM

## 2013-02-05 DIAGNOSIS — I2510 Atherosclerotic heart disease of native coronary artery without angina pectoris: Secondary | ICD-10-CM

## 2013-02-05 DIAGNOSIS — I495 Sick sinus syndrome: Secondary | ICD-10-CM

## 2013-02-05 DIAGNOSIS — E785 Hyperlipidemia, unspecified: Secondary | ICD-10-CM

## 2013-02-05 MED ORDER — ATORVASTATIN CALCIUM 20 MG PO TABS: 20.0000 mg | ORAL_TABLET | Freq: Every evening | ORAL | Status: DC

## 2013-02-05 NOTE — Assessment & Plan Note (Signed)
The patient previously was on a medication that included niacin and lovastatin. I have recommended that he be changed to Lipitor.

## 2013-02-05 NOTE — Assessment & Plan Note (Signed)
Blood pressure is controlled. No change in therapy. 

## 2013-02-05 NOTE — Assessment & Plan Note (Signed)
The patient's coronary disease is stable. His nuclear scan in February, 2014 revealed no significant ischemia. No further workup.

## 2013-02-05 NOTE — Progress Notes (Signed)
HPI  The patient is seen today to followup his coronary disease and his episodes of weakness and palpitations. He was seen last in our office in February, 2014. Ultimately an event recorder was arranged. He also had a nuclear scan done in February, 2014. This was a low risk scan showing some decreased activity in the anterior wall most compatible with tissue attenuation. Ejection fraction was 56%.  As I speak with the patient today he tells me that he continues to have episodes of palpitations. Separate from this he has episodes of significant fatigue. He has not had true syncope.  The event recorder shows that the patient has episodes of supraventricular tachycardia. The exact type is not clear to me. I am not sure that this is atrial fibrillation. He is on a beta blocker. At the same time he has episodes of 2-1 block. 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  Allergies  Allergen Reactions  . Sulfonamide Derivatives     REACTION: hives    Current Outpatient Prescriptions  Medication Sig Dispense Refill  . albuterol (PROVENTIL) (2.5 MG/3ML) 0.083% nebulizer solution Take 2.5 mg by nebulization every 6 (six) hours as needed for wheezing.      [name] allopurinol (ZYLOPRIM) 300 MG tablet Take 300 mg by mouth daily.        [name] aspirin EC 325 MG tablet Take 325 mg by mouth daily.        . beta carotene w/minerals (OCUVITE) tablet Take 1 tablet by mouth daily.        . cetirizine (ZYRTEC ALLERGY) 10 MG tablet Take 10 mg by mouth daily.        . colchicine (COLCRYS) 0.6 MG tablet Take 0.6 mg by mouth every 4 (four) hours as needed.        . fluticasone (FLONASE) 50 MCG/ACT nasal spray Place 2 sprays into the nose daily.        . indomethacin (INDOCIN SR) 75 MG CR capsule Take 75 mg by mouth.       . metoprolol succinate (TOPROL-XL) 25 MG 24 hr tablet Take 25 mg by mouth 2 (two) times daily.       . nitroGLYCERIN (NITROLINGUAL) 0.4 MG/SPRAY spray Place 1 spray under  the tongue every 5 (five) minutes as needed for chest pain.      [name] omeprazole (PRILOSEC) 20 MG capsule Take 20 mg by mouth daily.        . potassium gluconate 595 MG TABS Take 595 mg by mouth 2 (two) times daily.        . QUEtiapine (SEROQUEL) 300 MG tablet Take 300 mg by mouth at bedtime.        . quinapril (ACCUPRIL) 10 MG tablet Take 20 mg by mouth daily. PLACE ON FILE      . zolpidem (AMBIEN) 10 MG tablet Take 10 mg by mouth at bedtime.        [name] atorvastatin (LIPITOR) 20 MG tablet Take 1 tablet (20 mg total) by mouth every evening.  30 tablet  6   No current facility-administered medications for this visit.    History   Social History  . Marital Status: Married    Spouse Name: N/A    Number of Children: N/A  . Years of Education: N/A   Occupational History  . Not on file.   Social History Main Topics  . Smoking status: Former Games developer  . Smokeless tobacco: Current User  . Alcohol Use: No  .  Drug Use: No  . Sexually Active: Yes   Other Topics Concern  . 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     Family History  Problem Relation Age of Onset  . COPD Mother   . Lung cancer Mother   . Heart disease Father   . Hypertension Sister   . Hyperlipidemia Brother   . Hypertension Brother   . Diabetes Brother     Past Medical History  Diagnosis Date  . Dyslipidemia     mixed  . CAD (coronary artery disease)     Catheterization September, 2010, LIMA to the LAD patent, SVG to diagonal 40% proximal stenosis, slight anterolateral hypokinesis, ejection fraction 50%, medical therapy  . HTN (hypertension)     unspec  . Asthma   . Gout   . ED (erectile dysfunction)   . Palpitations     Event recorder November, 2010, no significant arrhythmias  . Bipolar 1 disorder   . Hx of CABG     2006, [place]  . Ejection fraction     EF 50%, catheterization, September, 2010  . Bradycardia     Very limited Wenkebach on  event recorder November, 2010  . Tobacco abuse     Past Surgical History  Procedure Laterality Date  . Back surgery      X4  . Coronary artery bypass graft  [place]     Patient Active Problem List   Diagnosis Date Noted  . Dyslipidemia   . CAD (coronary artery disease)   . HTN (hypertension)   . Asthma   . Palpitations   . Bipolar 1 disorder   . Hx of CABG   . Ejection fraction   . Bradycardia   . Tobacco abuse   . ALLERGIC RHINITIS 05/14/2010  . ASTHMA, UNSPECIFIED, UNSPECIFIED STATUS 07/05/2009    ROS   Patient denies fever, chills, headache, sweats, rash, change in vision, change in hearing, chest pain, cough, nausea or vomiting, urinary symptoms. All other systems are reviewed and are negative.  PHYSICAL EXAM  Patient is oriented to person time and place. Affect is normal. He is here with his wife. There is no jugulovenous distention. Lungs are clear. Respiratory effort is nonlabored. Cardiac exam reveals S1 and S2. There no clicks or significant murmurs. The abdomen is soft. There is no peripheral edema.  Filed Vitals:   02/05/13 1410  BP: 109/77  Pulse: 75  Height: 5\' 7"  (1.702 m)  Weight: 215 lb 12.8 oz (97.886 kg)     ASSESSMENT & PLAN

## 2013-02-05 NOTE — Patient Instructions (Signed)
   Stop Advacor  Begin Atorvastatin 20mg  every evening  Referral to EP for tachy/brady syndrome Continue all other current medications. Follow up in  3 months

## 2013-02-05 NOTE — Assessment & Plan Note (Signed)
I have outlined above the findings of the event recorder. The patient has tachycardia palpitations and has some tachycardia. The exact type of arrhythmia is not clear to me. He is on a beta blocker. I am not able to increase his beta blocker because he has significant 2-1 block. 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 I am not able to adjust his medicines further. I personally believe that he is symptomatic from this. I have arranged for evaluation by electrophysiology. I told the patient that he may be a candidate for pacemaker. My personal bias is that it would help him. This is being arranged.

## 2013-02-25 ENCOUNTER — Ambulatory Visit (INDEPENDENT_AMBULATORY_CARE_PROVIDER_SITE_OTHER): Payer: BC Managed Care – PPO | Admitting: Internal Medicine

## 2013-02-25 ENCOUNTER — Encounter: Payer: Self-pay | Admitting: Internal Medicine

## 2013-02-25 VITALS — BP 124/72 | HR 76 | Ht 67.0 in | Wt 220.0 lb

## 2013-02-25 DIAGNOSIS — I10 Essential (primary) hypertension: Secondary | ICD-10-CM

## 2013-02-25 DIAGNOSIS — R0989 Other specified symptoms and signs involving the circulatory and respiratory systems: Secondary | ICD-10-CM

## 2013-02-25 DIAGNOSIS — I495 Sick sinus syndrome: Secondary | ICD-10-CM

## 2013-02-25 DIAGNOSIS — I441 Atrioventricular block, second degree: Secondary | ICD-10-CM

## 2013-02-25 DIAGNOSIS — R0609 Other forms of dyspnea: Secondary | ICD-10-CM

## 2013-02-25 DIAGNOSIS — I2510 Atherosclerotic heart disease of native coronary artery without angina pectoris: Secondary | ICD-10-CM

## 2013-02-25 DIAGNOSIS — R0683 Snoring: Secondary | ICD-10-CM

## 2013-02-25 NOTE — Patient Instructions (Addendum)
Your physician recommends that you schedule a follow-up appointment in: 6 weeks with Dr Johney Frame    Your physician has requested that you have an exercise tolerance test. For further information please visit [email or web address] Please also follow instruction sheet, as given.  Your physician has recommended that you have a sleep study. This test records several body functions during sleep, including: brain activity, eye movement, oxygen and carbon dioxide blood levels, heart rate and rhythm, breathing rate and rhythm, the flow of air through your mouth and nose, snoring, body muscle movements, and chest and belly movement.   Discuss with Dr Janace Litten about decresing your [place]el

## 2013-03-10 ENCOUNTER — Ambulatory Visit (INDEPENDENT_AMBULATORY_CARE_PROVIDER_SITE_OTHER): Payer: BC Managed Care – PPO | Admitting: Physician Assistant

## 2013-03-10 DIAGNOSIS — I441 Atrioventricular block, second degree: Secondary | ICD-10-CM | POA: Insufficient documentation

## 2013-03-10 DIAGNOSIS — R079 Chest pain, unspecified: Secondary | ICD-10-CM

## 2013-03-10 DIAGNOSIS — I2510 Atherosclerotic heart disease of native coronary artery without angina pectoris: Secondary | ICD-10-CM

## 2013-03-10 DIAGNOSIS — I495 Sick sinus syndrome: Secondary | ICD-10-CM

## 2013-03-10 HISTORY — DX: Atrioventricular block, second degree: I44.1

## 2013-03-10 NOTE — Progress Notes (Signed)
Primary Care Physician: Bradd Burner, PA-C Referring Physician:  Dr Toniann Fail is a 57 y.o. male with a h/o CAD and prior substance abuse who presents for EP consultation regarding symptoms of fatigue and bradycardia.  He reports having a h/o ETOH abuse for which he has been managed with several medicines including seroquel.  Initially with higher doses of seroquel, he felt "horrible" with significant daytime somnolence and fatigue.  He decreased the doses on his own and found that these symptoms largely improved.  He continues to take seroquel as well as ambien to sleep and finds that he has difficulty sleeping at night.  He reports that he snores at night and does not feel well rested in the morning.  He is tired throughout the day.  He also reports occasional palpitations.  He had an event monitor placed by Dr Myrtis Ser.   The event recorder shows that the patient has episodes of mobitz I second degree AV block (though these appear to be nocturnal).  Short runs of nonsustained atrial tachycardia and PACs are also observed.  Today, he denies symptoms of chest pain, shortness of breath, orthopnea, PND, lower extremity edema, dizziness, presyncope, syncope, or neurologic sequela. The patient is tolerating medications without difficulties and is otherwise without complaint today.   Past Medical History  Diagnosis Date  . Dyslipidemia     mixed  . CAD (coronary artery disease)     Catheterization September, 2010, LIMA to the LAD patent, SVG to diagonal 40% proximal stenosis, slight anterolateral hypokinesis, ejection fraction 50%, medical therapy  . HTN (hypertension)     unspec  . Asthma   . Gout   . ED (erectile dysfunction)   . Palpitations     Event recorder November, 2010, no significant arrhythmias  . Bipolar 1 disorder   . Hx of CABG     2006, [place]  . Ejection fraction     EF 50%, catheterization, September, 2010  . Bradycardia     Very limited  Wenkebach on event recorder November, 2010  . Tobacco abuse   . 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 Back surgery      X4  . Coronary artery bypass graft  2006    [place]     Current Outpatient Prescriptions  Medication Sig Dispense Refill  . albuterol (PROVENTIL) (2.5 MG/3ML) 0.083% nebulizer solution Take 2.5 mg by nebulization every 6 (six) hours as needed for wheezing.      [name] allopurinol (ZYLOPRIM) 300 MG tablet Take 300 mg by mouth daily.        [name] aspirin EC 325 MG tablet Take 325 mg by mouth daily.        [name] atorvastatin (LIPITOR) 20 MG tablet Take 1 tablet (20 mg total) by mouth every evening.  30 tablet  6  . beta carotene w/minerals (OCUVITE) tablet Take 1 tablet by mouth daily.        . cetirizine (ZYRTEC ALLERGY) 10 MG tablet Take 10 mg by mouth daily.        . colchicine (COLCRYS) 0.6 MG tablet Take 0.6 mg by mouth every 4 (four) hours as needed.        . fluticasone (FLONASE) 50 MCG/ACT nasal spray Place 2 sprays into the nose daily.        . indomethacin (INDOCIN SR) 75 MG CR capsule Take 75 mg by mouth.       [name]  metoprolol succinate (TOPROL-XL) 25 MG 24 hr tablet Take 25 mg by mouth 2 (two) times daily.       . nitroGLYCERIN (NITROLINGUAL) 0.4 MG/SPRAY spray Place 1 spray under the tongue every 5 (five) minutes as needed for chest pain.      [name] omeprazole (PRILOSEC) 20 MG capsule Take 20 mg by mouth daily.        . potassium gluconate 595 MG TABS Take 595 mg by mouth 2 (two) times daily.        . QUEtiapine (SEROQUEL) 300 MG tablet Take 300 mg by mouth at bedtime.        . quinapril (ACCUPRIL) 10 MG tablet Take 20 mg by mouth daily. PLACE ON FILE      . zolpidem (AMBIEN) 10 MG tablet Take 10 mg by mouth at bedtime.         No current facility-administered medications for this visit.    Allergies  Allergen Reactions  . Sulfonamide Derivatives     REACTION: hives    History   Social History  .  Marital Status: Married    Spouse Name: N/A    Number of Children: N/A  . Years of Education: N/A   Occupational History  . Not on file.   Social History Main Topics  . Smoking status: Former Games developer  . Smokeless tobacco: Current User  . Alcohol Use: No  . Drug Use: No  . Sexually Active: Yes   Other Topics Concern  . 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     Family History  Problem Relation Age of Onset  . COPD Mother   . Lung cancer Mother   . Heart disease Father   . Hypertension Sister   . Hyperlipidemia Brother   . Hypertension Brother   . Diabetes Brother     ROS- All systems are reviewed and negative except as per the HPI above  Physical Exam: Filed Vitals:   02/25/13 1158  BP: 124/72  Pulse: 76  Height: 5\' 7"  (1.702 m)  Weight: 220 lb (99.791 kg)    GEN- The patient is overweight appearing, alert and oriented x 3 today.   Head- normocephalic, atraumatic Eyes-  Sclera clear, conjunctiva pink Ears- hearing intact Oropharynx- clear Neck- supple, no JVP Lymph- no cervical lymphadenopathy Lungs- Clear to ausculation bilaterally, normal work of breathing Heart- Regular rate and rhythm, no murmurs, rubs or gallops, PMI not laterally displaced GI- soft, NT, ND, + BS Extremities- no clubbing, cyanosis, or edema MS- no significant deformity or atrophy Skin- no rash or lesion Psych- euthymic mood, full affect Neuro- strength and sensation are intact  EKG today reveals sinus rhythm 76 bpm with first degree AV block, incomplete RBBB  Assessment and Plan:  1. Second degree AV block (mobitz I)/ fatigue Episodes are mostly noctural.  Today in the office, he is fatigued and his heart rate is 76.  I do not think that his symptoms are due to bradycardia.  I think that they are more likely to be due to seroquil, I have instructed him to contact his prescribing provider to see if this could be reduced  further or possibly stopped.  In addition, he has symptoms suggesting sleep apnea.  I will therefore arrange a sleep study. We should avoid PPM at this time.  2. Atrial tachycardia Nonsustained and not very fast I would not more aggressively treat at this time  3. HTN Stable  No change required today  4. CAD Stable No change required today  5. Tobacco Cessation strongly advised

## 2013-03-10 NOTE — Progress Notes (Signed)
Exercise Treadmill Test  Pre-Exercise Testing Evaluation Rhythm: normal sinus  Rate: 76     Test  Exercise Tolerance Test Ordering MD: Hillis Range, MD  Interpreting MD: Tereso Newcomer, PA-C  Unique Test No: 1  Treadmill:  1  Indication for ETT: CAD, Tachy-Brady Syndrome, Chest Pain, DOE  Contraindication to ETT: No   Stress Modality: exercise - treadmill  Cardiac Imaging Performed: non   Protocol: standard Bruce - maximal  Max BP:  148/76  Max MPHR (bpm):  165 85% MPR (bpm):  140  MPHR obtained (bpm):  118 % MPHR obtained:  72  Reached 85% MPHR (min:sec):  n/a Total Exercise Time (min-sec):  7:12  Workload in METS:  8.7 Borg Scale: 17  Reason ETT Terminated:  fatigue; dyspnea    ST Segment Analysis At Rest: normal ST segments - no evidence of significant ST depression; long 1st degree AVB With Exercise: no evidence of significant ST depression at submaximal exercise  Other Information Arrhythmia:  No Angina during ETT:  present (1) Quality of ETT:  non-diagnostic  ETT Interpretation:  No evidence of ST depression at submaximal exercise  Comments: Fair exercise tolerance. He did c/o chest pain.  He had significant dyspnea and fatigue. Normal BP response to exercise. No ST-T changes to suggest ischemia at submaximal exercise. HR increased from 76 at baseline => 118 max exercise. Of note, patient did NOT hold beta blocker.   Recommendations: F/u with Dr. Hillis Range as planned. Signed,  Tereso Newcomer, PA-C   03/10/2013 10:01 AM

## 2013-03-11 DIAGNOSIS — I10 Essential (primary) hypertension: Secondary | ICD-10-CM

## 2013-03-11 DIAGNOSIS — R21 Rash and other nonspecific skin eruption: Secondary | ICD-10-CM

## 2013-03-11 DIAGNOSIS — R079 Chest pain, unspecified: Secondary | ICD-10-CM

## 2013-04-02 ENCOUNTER — Ambulatory Visit (HOSPITAL_BASED_OUTPATIENT_CLINIC_OR_DEPARTMENT_OTHER): Payer: BC Managed Care – PPO | Attending: Internal Medicine | Admitting: Radiology

## 2013-04-02 VITALS — Ht 67.0 in | Wt 213.0 lb

## 2013-04-02 DIAGNOSIS — I441 Atrioventricular block, second degree: Secondary | ICD-10-CM | POA: Insufficient documentation

## 2013-04-02 DIAGNOSIS — R0683 Snoring: Secondary | ICD-10-CM

## 2013-04-02 DIAGNOSIS — I498 Other specified cardiac arrhythmias: Secondary | ICD-10-CM | POA: Insufficient documentation

## 2013-04-02 DIAGNOSIS — G4733 Obstructive sleep apnea (adult) (pediatric): Secondary | ICD-10-CM | POA: Insufficient documentation

## 2013-04-08 DIAGNOSIS — I498 Other specified cardiac arrhythmias: Secondary | ICD-10-CM

## 2013-04-08 DIAGNOSIS — G4733 Obstructive sleep apnea (adult) (pediatric): Secondary | ICD-10-CM

## 2013-04-09 NOTE — Procedures (Signed)
NAMEPARISH, [name]           ACCOUNT NO.:  [id number]  MEDICAL RECORD NO.:  [id number]          PATIENT TYPE:  OUT  LOCATION:  SLEEP CENTER                 FACILITY:  [facility]  PHYSICIAN:  Barbaraann Share, MD,FCCPDATE OF BIRTH:  24-Jan-1958  DATE OF STUDY:  04/02/2013                           NOCTURNAL POLYSOMNOGRAM  REFERRING PHYSICIAN:  Hillis Range, MD  INDICATION FOR STUDY:  Hypersomnia with sleep apnea.  EPWORTH SLEEPINESS SCORE:  6.  MEDICATIONS:  SLEEP ARCHITECTURE:  The patient had a total sleep time of 383 minutes with no slow-wave sleep and only 78 minutes of REM.  Sleep onset latency was normal at 28 minutes and REM onset was mildly delayed.  Sleep efficiency was mildly reduced at 89%.  RESPIRATORY DATA:  The patient was found to have 5 apneas and 37 obstructive hypopneas, giving him an apnea-hypopnea index of only 7 events per hour.  The events occurred more frequently in the supine position, and there was mild snoring noted throughout.  The patient did not meet split night protocol secondary to his small numbers of events.  OXYGEN DATA:  There was O2 desaturation as low as 87% with the patient's obstructive events.  CARDIAC DATA:  The patient was noted to have sinus bradycardia throughout with rare PVCs and occasional episodes of 2nd degree AV block of the Wenckebach variety.  MOVEMENTS-PARASOMNIA:  The patient had no significant leg jerks or other abnormal behaviors noted.  IMPRESSIONS-RECOMMENDATIONS: 1. Very mild obstructive sleep apnea/hypopnea syndrome, with an AHI of     7 events per hour and oxygen desaturation transiently as low as     87%.  Treatment for this degree of sleep apnea can include a trial     of weight loss alone, upper airway surgery, dental appliance, and     also CPAP.  Clinical correlation is suggested.  The decision to     treat this degree of sleep apnea aggressively should be dependent     upon its impact to the patient's  quality of life.  It is typically     not associated with significant increased cardiovascular events. 2. Sinus bradycardia noted throughout the night, along with rare PVC     and occasional episodes of second-degree AV block of the Wenckebach     variety.     Barbaraann Share, MD,FCCP Diplomate, American Board of Sleep Medicine    [facility]/MED[facility]  D:  04/08/2013 14:27:35  T:  04/09/2013 02:43:08  Job:  [id number]

## 2013-05-20 ENCOUNTER — Telehealth: Payer: Self-pay | Admitting: *Deleted

## 2013-05-20 NOTE — Telephone Encounter (Signed)
Left message on voicemail for him to call me to discuss results

## 2013-05-20 NOTE — Telephone Encounter (Signed)
Follow up  Pt returning a call/

## 2013-05-21 NOTE — Telephone Encounter (Signed)
Spoke with patient and he does not feel that he needs any follow up with his degree of sleep apnea.  If he feels it starts to effect him more he will let us know

## 2013-05-28 ENCOUNTER — Ambulatory Visit: Payer: BC Managed Care – PPO | Admitting: Cardiology

## 2013-06-06 ENCOUNTER — Encounter: Payer: Self-pay | Admitting: Cardiology

## 2013-06-08 ENCOUNTER — Encounter: Payer: Self-pay | Admitting: Cardiology

## 2013-06-08 ENCOUNTER — Ambulatory Visit (INDEPENDENT_AMBULATORY_CARE_PROVIDER_SITE_OTHER): Payer: BC Managed Care – PPO | Admitting: Cardiology

## 2013-06-08 VITALS — BP 146/92 | HR 61 | Ht 68.0 in | Wt 221.0 lb

## 2013-06-08 DIAGNOSIS — G4733 Obstructive sleep apnea (adult) (pediatric): Secondary | ICD-10-CM

## 2013-06-08 DIAGNOSIS — I441 Atrioventricular block, second degree: Secondary | ICD-10-CM

## 2013-06-08 DIAGNOSIS — I2510 Atherosclerotic heart disease of native coronary artery without angina pectoris: Secondary | ICD-10-CM

## 2013-06-08 NOTE — Assessment & Plan Note (Signed)
This is mild and nocturnal. No change in therapy.

## 2013-06-08 NOTE — Patient Instructions (Addendum)

## 2013-06-08 NOTE — Assessment & Plan Note (Signed)
Coronary disease is stable. No further workup at this time. 

## 2013-06-08 NOTE — Progress Notes (Signed)
HPI  Patient is seen today to followup coronary disease. He stable today. He has some mild vertigo that responds to meclizine. Since his last visit he has been seen by Dr. Johney Frame for electrophysiology assessment. Dr. Johney Frame felt that he had some Mobitz type I, second degree AV block. This was mostly nocturnal. It was felt that his symptoms were not due to bradycardia. It was possible that some of his symptoms were related to his Seroquel. Pacemaker was not indicated. The plan was to follow his atrial tachycardia with no change in therapy. A standard treadmill was done to be sure that he did not have chronotropic incompetence. This was done. A good heart rate response. No change in therapy. Also, a sleep study was done. In the report it mentions very mild obstructive sleep apnea. Treatment can include weight loss alone, dental appliance, and also CPAP. Clinical correlation was recommended.  Allergies  Allergen Reactions  . Sulfonamide Derivatives     REACTION: hives    Current Outpatient Prescriptions  Medication Sig Dispense Refill  . albuterol (PROVENTIL) (2.5 MG/3ML) 0.083% nebulizer solution Take 2.5 mg by nebulization every 6 (six) hours as needed for wheezing.      [name] allopurinol (ZYLOPRIM) 300 MG tablet Take 300 mg by mouth daily.        [name] aspirin EC 325 MG tablet Take 325 mg by mouth daily.        [name] atorvastatin (LIPITOR) 20 MG tablet Take 1 tablet (20 mg total) by mouth every evening.  30 tablet  6  . beta carotene w/minerals (OCUVITE) tablet Take 1 tablet by mouth daily.        . cetirizine (ZYRTEC ALLERGY) 10 MG tablet Take 10 mg by mouth daily.        . colchicine (COLCRYS) 0.6 MG tablet Take 0.6 mg by mouth every 4 (four) hours as needed.        . indomethacin (INDOCIN SR) 75 MG CR capsule Take 75 mg by mouth.       . metoprolol succinate (TOPROL-XL) 25 MG 24 hr tablet Take 25 mg by mouth 2 (two) times daily.       . nitroGLYCERIN (NITROLINGUAL) 0.4 MG/SPRAY spray Place 1 spray  under the tongue every 5 (five) minutes as needed for chest pain.      [name] omeprazole (PRILOSEC) 20 MG capsule Take 20 mg by mouth daily.        . potassium gluconate 595 MG TABS Take 595 mg by mouth 2 (two) times daily.        . QUEtiapine (SEROQUEL) 300 MG tablet Take 300 mg by mouth at bedtime.        . quinapril (ACCUPRIL) 10 MG tablet Take 20 mg by mouth daily. PLACE ON FILE      . zolpidem (AMBIEN) 10 MG tablet Take 10 mg by mouth at bedtime.         No current facility-administered medications for this visit.    History   Social History  . Marital Status: Married    Spouse Name: N/A    Number of Children: N/A  . Years of Education: N/A   Occupational History  . Not on file.   Social History Main Topics  . Smoking status: Former Smoker -- 2.00 packs/day for 17 years    Types: Cigarettes    Quit date: 09/16/1985  . Smokeless tobacco: Current User  . Alcohol Use: No  . Drug Use: No  . Sexual Activity: Yes  Other Topics Concern  . 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     Family History  Problem Relation Age of Onset  . COPD Mother   . Lung cancer Mother   . Heart disease Father   . Hypertension Sister   . Hyperlipidemia Brother   . Hypertension Brother   . Diabetes Brother     Past Medical History  Diagnosis Date  . Dyslipidemia     mixed  . CAD (coronary artery disease)     Catheterization September, 2010, LIMA to the LAD patent, SVG to diagonal 40% proximal stenosis, slight anterolateral hypokinesis, ejection fraction 50%, medical therapy  . HTN (hypertension)     unspec  . Asthma   . Gout   . ED (erectile dysfunction)   . Palpitations     Event recorder November, 2010, no significant arrhythmias  . Bipolar 1 disorder   . Hx of CABG     2006, [place]  . Ejection fraction     EF 50%, catheterization, September, 2010  . Bradycardia     Very limited Wenkebach on event recorder November,  2010  . Tobacco abuse   . 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May, 2014    Past Surgical History  Procedure Laterality Date  . Back surgery      X4  . [place]     Patient Active Problem List   Diagnosis Date Noted  . Second degree AV block, Mobitz type I 03/10/2013  . 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. Dyslipidemia   . CAD (coronary artery disease)   . HTN (hypertension)   . Asthma   . Palpitations   . Bipolar 1 disorder   . Hx of CABG   . Ejection fraction   . Bradycardia   . Tobacco abuse   . ALLERGIC RHINITIS 05/14/2010  . ASTHMA, UNSPECIFIED, UNSPECIFIED STATUS 07/05/2009    ROS   Patient denies fever, chills, headache, sweats, rash, change in vision, change in hearing, chest pain, cough, nausea vomiting, urinary symptoms. All other systems are reviewed and are negative.  PHYSICAL EXAM  Patient is overweight. He is oriented to person time and place. Affect is normal. There is no jugulovenous distention. Lungs are clear. Respiratory effort is not labored. Cardiac exam reveals S1 and S2. There no clicks or significant murmurs. The abdomen is soft. Is no peripheral edema.  Filed Vitals:   06/08/13 0802  BP: 146/92  Pulse: 61  Height: 5\' 8"  (1.727 m)  Weight: 221 lb (100.245 kg)     ASSESSMENT & PLAN

## 2013-06-08 NOTE — Assessment & Plan Note (Signed)
This is mild and a new diagnosis. Weight loss is recommended. Further clinical correlation is needed.

## 2013-09-16 DIAGNOSIS — J189 Pneumonia, unspecified organism: Secondary | ICD-10-CM

## 2013-09-16 HISTORY — DX: Pneumonia, unspecified organism: J18.9

## 2013-10-05 ENCOUNTER — Other Ambulatory Visit: Payer: Self-pay | Admitting: Cardiology

## 2013-11-05 ENCOUNTER — Other Ambulatory Visit: Payer: Self-pay | Admitting: Family Medicine

## 2013-11-05 DIAGNOSIS — R413 Other amnesia: Secondary | ICD-10-CM

## 2013-11-12 ENCOUNTER — Other Ambulatory Visit: Payer: BC Managed Care – PPO

## 2013-11-18 ENCOUNTER — Encounter: Payer: Self-pay | Admitting: Neurology

## 2013-11-18 ENCOUNTER — Ambulatory Visit (INDEPENDENT_AMBULATORY_CARE_PROVIDER_SITE_OTHER): Payer: BC Managed Care – PPO | Admitting: Neurology

## 2013-11-18 VITALS — BP 110/76 | HR 74 | Temp 97.9°F | Ht 67.0 in | Wt 226.0 lb

## 2013-11-18 DIAGNOSIS — R413 Other amnesia: Secondary | ICD-10-CM | POA: Insufficient documentation

## 2013-11-18 HISTORY — DX: Other amnesia: R41.3

## 2013-11-18 NOTE — Progress Notes (Signed)
NEUROLOGY CONSULTATION NOTE  [name] [name] MRN: [id number] DOB: [birth year]  Referring provider: Baruch Goldmann, PA-C Primary care provider: Baruch Goldmann, PA-C  Reason for consult:  Memory loss  Thank you for your kind referral of [name] [name] for consultation of the above symptoms. Although his history is well known to you, please allow me to reiterate it for the purpose of our medical record.   HISTORY OF PRESENT ILLNESS: This is a pleasant 58 year old ambidextrous right-hand dominant man with a history of hypertension, CAD s/p CABG, hyperlipidemia, bipolar disorder, presenting for evaluation of worsening memory.  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  He was diagnosed with bipolar disorder in 2007, at which time he was started on Seroquel and Ambien.  He feels that his memory got worse at that time, and felt like a zombie on Seroquel 678m qhs, reducing it down to 3040mqhs, which he has been taking at this dose since 2007 with good effect on sleep and "mind going 100 miles an hour."  Over the past year, he has noticed memory problems where he would not recall doing things or conversations he had the night prior.  His wife is getting frustrated with him.  He forgot the name of a co-worker he has known for 12 years.  He has also noticed word-finding difficulties where he can't think of the word he wants to say.  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  He gets agitated and frustrated with himself when he cannot recall things. Stress worsens the memory problems.  He continues to drive without getting lost.  He likes to cook and has not left the stove on or any dangerous behaviors.  He has no difficulties with ADLs.  His wife has always been in charge of their bills.  He reports mood is good, he is easygoing and laidback.  Due to concern for medication  effect on his memory problems, Ambien was discontinued 2 weeks ago and switched to clonazepam.  He feels that this has helped somewhat with his memory, he seems to be retaining more of conversations.  He tried reducing the dose of Seroquel in half but could not sleep, and is concerned of losing the stability he has on current dose.    He denies any headaches, dizziness, diplopia, dysarthria, dysphagia, neck/back pain, focal numbness/tingling/weakness, bowel/bladder dysfunction.  His paternal grandmother and paternal aunt were diagnosed with Alzheimer's disease.    Records and images were personally reviewed where available.  Laboratory Data 11/04/2013: normal CBC, CMP, B12 294, TSH 1.88, ESR 1, HbA1c 5.3, lipid panel normal except for elevated triglycerides 260. He was scheduled for an MRI brain but had to reschedule due to the weather.  PAST MEDICAL HISTORY: Past Medical History  Diagnosis Date  . Dyslipidemia     mixed  . CAD (coronary artery disease)     Catheterization September, 2010, LIMA to the LAD patent, SVG to diagonal 40% proximal stenosis, slight anterolateral hypokinesis, ejection fraction 50%, medical therapy  . HTN (hypertension)     unspec  . Asthma   . Gout   . ED (erectile dysfunction)   . Palpitations     Event recorder November, 2010, no significant arrhythmias  . Bipolar 1 disorder   . Hx of CABG     2006, [place]  .  Ejection fraction     EF 50%, catheterization, September, 2010  . Bradycardia     Very limited Wenkebach on event recorder November, 2010  . Tobacco abuse   . 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 Obstructive sleep apnea     PAST SURGICAL HISTORY: Past Surgical History  Procedure Laterality Date  . Back surgery      X4  . Coronary artery bypass graft  2006    [place]     MEDICATIONS: Current Outpatient Prescriptions on File Prior to Visit  Medication Sig Dispense Refill  . albuterol (PROVENTIL) (2.5 MG/3ML) 0.083%  nebulizer solution Take 2.5 mg by nebulization every 6 (six) hours as needed for wheezing.      [name] allopurinol (ZYLOPRIM) 300 MG tablet Take 300 mg by mouth daily.        [name] aspirin EC 325 MG tablet Take 325 mg by mouth daily.        [name] atorvastatin (LIPITOR) 20 MG tablet TAKE ONE TABLET (20 MG TOTAL) BY MOUTH EVERY EVENING *STOP ADVACOR, MED CHANGE 02/05/13*  30 tablet  1  . cetirizine (ZYRTEC ALLERGY) 10 MG tablet Take 10 mg by mouth daily.        . colchicine (COLCRYS) 0.6 MG tablet Take 0.6 mg by mouth every 4 (four) hours as needed.        . indomethacin (INDOCIN SR) 75 MG CR capsule Take 75 mg by mouth.       . metoprolol succinate (TOPROL-XL) 25 MG 24 hr tablet Take 25 mg by mouth 2 (two) times daily.       . nitroGLYCERIN (NITROLINGUAL) 0.4 MG/SPRAY spray Place 1 spray under the tongue every 5 (five) minutes as needed for chest pain.      [name] omeprazole (PRILOSEC) 20 MG capsule Take 20 mg by mouth daily.        . potassium gluconate 595 MG TABS Take 595 mg by mouth 2 (two) times daily.        . QUEtiapine (SEROQUEL) 300 MG tablet Take 300 mg by mouth at bedtime.        . quinapril (ACCUPRIL) 10 MG tablet Take 40 mg by mouth daily. PLACE ON FILE       No current facility-administered medications on file prior to visit.    ALLERGIES: Allergies  Allergen Reactions  . Sulfonamide Derivatives     REACTION: hives    FAMILY HISTORY: Family History  Problem Relation Age of Onset  . COPD Mother   . Lung cancer Mother   . Heart disease Father   . Hypertension Sister   . Hyperlipidemia Brother   . Hypertension Brother   . Diabetes Brother     SOCIAL HISTORY: History   Social History  . Marital Status: Married    Spouse Name: N/A    Number of Children: N/A  . Years of Education: N/A   Occupational History  . Not on file.   Social History Main Topics  . Smoking status: Former Smoker -- 2.00 packs/day for 17 years    Types: Cigarettes    Quit date: 09/16/1985  . Smokeless  tobacco: Current User  . Alcohol Use: No  . Drug Use: No  . Sexual Activity: Yes   Other Topics Concern  . Not on file   Social History Narrative   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     REVIEW OF SYSTEMS: Constitutional: No fevers, chills, or sweats, no generalized fatigue, change  in appetite Eyes: No visual changes, double vision, eye pain Ear, nose and throat: No hearing loss, ear pain, nasal congestion, sore throat Cardiovascular: No chest pain, palpitations Respiratory:  No shortness of breath at rest or with exertion, wheezes GastrointestinaI: No nausea, vomiting, diarrhea, abdominal pain, fecal incontinence Genitourinary:  No dysuria, urinary retention or frequency Musculoskeletal:  No neck pain, back pain Integumentary: No rash, pruritus, skin lesions Neurological: as above Psychiatric: No depression, insomnia, anxiety Endocrine: No palpitations, fatigue, diaphoresis, mood swings, change in appetite, change in weight, increased thirst Hematologic/Lymphatic:  No anemia, purpura, petechiae. Allergic/Immunologic: no itchy/runny eyes, nasal congestion, recent allergic reactions, rashes  PHYSICAL EXAM: Filed Vitals:   11/18/13 0940  BP: 110/76  Pulse: 74  Temp: 97.9 F (36.6 C)   General: No acute distress Head:  Normocephalic/atraumatic Neck: supple, no paraspinal tenderness, full range of motion Back: No paraspinal tenderness Heart: regular rate and rhythm Lungs: Clear to auscultation bilaterally. Vascular: No carotid bruits. Skin/Extremities: No rash, no edema Neurological Exam: Mental status: alert and oriented to person, place, and time, no dysarthria or dysphagia, Fund of knowledge is appropriate.  Recent and remote memory are intact.  Decreased attention and concentration with serial sevens, he became anxious and frustrated.   Able to name objects and repeat phrases. MOCA 28/30 (missed points for serial 7s and language, named only 4 words  starting with letter F). Cranial nerves: CN I: not tested CN II: pupils equal, round and reactive to light, visual fields intact, fundi unremarkable. CN III, IV, VI:  full range of motion, no nystagmus, no ptosis CN V: facial sensation intact CN VII: upper and lower face symmetric CN VIII: hearing intact CN IX, X: gag intact, uvula midline CN XI: sternocleidomastoid and trapezius muscles intact CN XII: tongue midline Bulk & Tone: normal, no fasciculations. Motor: 5/5 throughout with no pronator drift. Sensation: intact to light touch, cold, pin, vibration and joint position sense.  No extinction to double simultaneous stimulation.  Romberg test negative Deep Tendon Reflexes: +2 throughout except for absent ankle jerks bilaterally, no clonus Plantar responses: downgoing bilaterally Cerebellar:  No incoordination on finger to nose, no dysdiadochokinesia Gait: felt unsteady walking but gait was narrow-based and steady, able to tandem walk adequately  IMPRESSION: This is a pleasant 58 year old right-handed man with vascular risk factors including hypertension, hyperlipidemia, CAD s/p CABG, as well as a history of bipolar disorder currently on Seroquel 360m qhs, presenting for worsening memory problems that have affected his work in the past month.  His neurological exam is non-focal, MOCA is normal at 28/30 (nl>26/30), missing points for attention and language.  He became anxious and frustrated during the test, stating that he did not want to be wrong and this made it even worse.  I agree with MRI brain to assess for underlying structural abnormality.  His B12 level is low normal, recommend B12 supplements.  We also discussed checking a vitamin D level, however he would like to discuss this with his PCP after the MRI scan first.  We discussed possible contribution of medication to his worsening memory, and the option to very slowly reduce Seroquel, however he expressed concern that he feels stable  on this dose. He reports mood is good, we discussed pseudodementia with depression and he will monitor for this. There is no clear indication for starting cholinesterase inhibitors at this time, however this may be considered in the future.  He will follow-up in 3 months.  Thank you for allowing me  to participate in the care of this patient. Please do not hesitate to call for any questions or concerns.   Ellouise Newer, M.D.

## 2013-11-18 NOTE — Patient Instructions (Addendum)
1. Proceed with MRI brain as scheduled by your PCP [facility]-[phone] 2. Start vitamin B12 supplements 500 mcg daily 3. Continue all medications for now 4. Follow-up in 3 months

## 2013-12-02 ENCOUNTER — Other Ambulatory Visit: Payer: Self-pay | Admitting: Cardiology

## 2013-12-07 ENCOUNTER — Telehealth: Payer: Self-pay | Admitting: Neurology

## 2013-12-07 NOTE — Telephone Encounter (Signed)
Pt called requesting to speak to a nurse regarding the MRI he is scheduled to have in April. Please call Pt.

## 2013-12-08 NOTE — Telephone Encounter (Signed)
Spoke with patient confirmed hie appointment

## 2013-12-16 ENCOUNTER — Ambulatory Visit
Admission: RE | Admit: 2013-12-16 | Discharge: 2013-12-16 | Disposition: A | Discharge status: Home / Self Care | Payer: BC Managed Care – PPO | Source: Ambulatory Visit | Attending: Family Medicine | Admitting: Family Medicine

## 2013-12-16 DIAGNOSIS — R413 Other amnesia: Secondary | ICD-10-CM

## 2013-12-16 MED ORDER — GADOBENATE DIMEGLUMINE 529 MG/ML IV SOLN
20.0000 mL | Freq: Once | INTRAVENOUS | Status: AC | PRN
  Administered 2013-12-16: 20 mL via INTRAVENOUS

## 2014-02-09 ENCOUNTER — Other Ambulatory Visit: Payer: Self-pay | Admitting: Cardiology

## 2014-02-15 ENCOUNTER — Ambulatory Visit: Payer: BC Managed Care – PPO | Admitting: Neurology

## 2014-02-16 ENCOUNTER — Telehealth: Payer: Self-pay | Admitting: Neurology

## 2014-02-16 NOTE — Telephone Encounter (Signed)
Pt no showed 02/15/14 follow up appt w/ Dr. Karel Jarvis. No show letter mailed to pt / Sherri S.

## 2014-03-11 ENCOUNTER — Other Ambulatory Visit: Payer: Self-pay | Admitting: Cardiology

## 2014-04-06 ENCOUNTER — Telehealth: Payer: Self-pay | Admitting: Cardiology

## 2014-04-06 MED ORDER — ATORVASTATIN CALCIUM 20 MG PO TABS: 20.0000 mg | ORAL_TABLET | Freq: Every day | ORAL | Status: DC

## 2014-04-06 NOTE — Telephone Encounter (Signed)
atorvastatin (LIPITOR) 20 MG tablet  Patient's pharmacy would only give him 15 pills

## 2014-05-27 ENCOUNTER — Encounter: Payer: Self-pay | Admitting: Cardiology

## 2014-05-27 ENCOUNTER — Ambulatory Visit (INDEPENDENT_AMBULATORY_CARE_PROVIDER_SITE_OTHER): Payer: BC Managed Care – PPO | Admitting: Cardiology

## 2014-05-27 VITALS — BP 102/64 | HR 70 | Ht 68.0 in | Wt 193.0 lb

## 2014-05-27 DIAGNOSIS — I10 Essential (primary) hypertension: Secondary | ICD-10-CM

## 2014-05-27 DIAGNOSIS — R001 Bradycardia, unspecified: Secondary | ICD-10-CM

## 2014-05-27 DIAGNOSIS — I25810 Atherosclerosis of coronary artery bypass graft(s) without angina pectoris: Secondary | ICD-10-CM

## 2014-05-27 DIAGNOSIS — E785 Hyperlipidemia, unspecified: Secondary | ICD-10-CM

## 2014-05-27 DIAGNOSIS — I498 Other specified cardiac arrhythmias: Secondary | ICD-10-CM

## 2014-05-27 DIAGNOSIS — I2510 Atherosclerotic heart disease of native coronary artery without angina pectoris: Secondary | ICD-10-CM

## 2014-05-27 MED ORDER — NITROGLYCERIN 0.4 MG/SPRAY TL SOLN: 1.0000 | Status: DC | PRN

## 2014-05-27 NOTE — Assessment & Plan Note (Signed)
Coronary disease is stable. He does not need any further studies. He does need to be on a statin.

## 2014-05-27 NOTE — Patient Instructions (Signed)
   Hold Atorvastatin - Please call office to update with nurse in 3 weeks regarding status of leg cramps. Continue all other medications.   Your physician wants you to follow up in: 6 months.  You will receive a reminder letter in the mail one-two months in advance.  If you don't receive a letter, please call our office to schedule the follow up appointment.

## 2014-05-27 NOTE — Progress Notes (Signed)
Patient ID: [name] [name], male   DOB: [birth year], 58 y.o.   MRN: [id number]    HPI  Patient returns for 1 year follow up of his coronary disease. He has CABG in the past. He does have mild Mobitz type I AV block at times. There is no syncope. He tells me that he has intermittent cramps in his legs. This does not sound like muscle pain but rather intermittent cramping. However his wife feels that he did not have this when he was on a combination medicine of niacin and lovastatin. She feels that it started after he was changed to atorvastatin. They requested to change because the original medication was expensive. He's not having any chest pain.  Allergies  Allergen Reactions  . Sulfonamide Derivatives     REACTION: hives    Current Outpatient Prescriptions  Medication Sig Dispense Refill  . albuterol (PROVENTIL) (2.5 MG/3ML) 0.083% nebulizer solution Take 2.5 mg by nebulization every 6 (six) hours as needed for wheezing.      [name] allopurinol (ZYLOPRIM) 300 MG tablet Take 300 mg by mouth daily.        [name] aspirin EC 325 MG tablet Take 325 mg by mouth daily.        [name] atorvastatin (LIPITOR) 20 MG tablet Take 1 tablet (20 mg total) by mouth daily.  30 tablet  2  . cetirizine (ZYRTEC ALLERGY) 10 MG tablet Take 10 mg by mouth daily.        . clonazePAM (KLONOPIN) 1 MG tablet Take 1 mg by mouth at bedtime.       . colchicine (COLCRYS) 0.6 MG tablet Take 0.6 mg by mouth every 4 (four) hours as needed.        . furosemide (LASIX) 20 MG tablet Take 20 mg by mouth.      . indomethacin (INDOCIN SR) 75 MG CR capsule Take 75 mg by mouth.       . metoprolol succinate (TOPROL-XL) 25 MG 24 hr tablet Take 25 mg by mouth 2 (two) times daily.       . nitroGLYCERIN (NITROLINGUAL) 0.4 MG/SPRAY spray Place 1 spray under the tongue every 5 (five) minutes as needed for chest pain.      [name] omeprazole (PRILOSEC) 20 MG capsule Take 20 mg by mouth daily.        . potassium gluconate 595 MG TABS Take 595 mg by mouth 2  (two) times daily.        . QUEtiapine (SEROQUEL) 300 MG tablet Take 300 mg by mouth at bedtime.        . quinapril (ACCUPRIL) 10 MG tablet Take 40 mg by mouth daily. PLACE ON FILE       No current facility-administered medications for this visit.    History   Social History  . Marital Status: Married    Spouse Name: N/A    Number of Children: N/A  . Years of Education: N/A   Occupational History  . Not on file.   Social History Main Topics  . Smoking status: Former Smoker -- 2.00 packs/day for 17 years    Types: Cigarettes    Quit date: 09/16/1985  . Smokeless tobacco: Current User  . Alcohol Use: No  . Drug Use: No  . Sexual Activity: Yes   Other Topics Concern  . 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     Family History  Problem Relation Age of Onset  .  COPD Mother   . Lung cancer Mother   . Heart disease Father   . Hypertension Sister   . Hyperlipidemia Brother   . Hypertension Brother   . Diabetes Brother     Past Medical History  Diagnosis Date  . Dyslipidemia     mixed  . CAD (coronary artery disease)     Catheterization September, 2010, LIMA to the LAD patent, SVG to diagonal 40% proximal stenosis, slight anterolateral hypokinesis, ejection fraction 50%, medical therapy  . HTN (hypertension)     unspec  . Asthma   . Gout   . ED (erectile dysfunction)   . Palpitations     Event recorder November, 2010, no significant arrhythmias  . Bipolar 1 disorder   . Hx of CABG     2006, [place]  . Ejection fraction     EF 50%, catheterization, September, 2010  . Bradycardia     Very limited Wenkebach on event recorder November, 2010  . Tobacco abuse   . 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 Obstructive sleep apnea     Past Surgical History  Procedure Laterality Date  . Back surgery      X4  . Coronary artery [place]  [place]     Patient Active Problem List     Diagnosis Date Noted  . Memory loss 11/18/2013  . Obstructive sleep apnea   . Second degree AV block, Mobitz type I 03/10/2013  . 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. Dyslipidemia   . CAD (coronary artery disease)   . HTN (hypertension)   . Asthma   . Palpitations   . Bipolar 1 disorder   . Hx of CABG   . Ejection fraction   . Bradycardia   . Tobacco abuse   . ALLERGIC RHINITIS 05/14/2010  . ASTHMA, UNSPECIFIED, UNSPECIFIED STATUS 07/05/2009    ROS   Patient denies fever, chills, headache, sweats, rash, change in vision, change in hearing, chest pain, cough, nausea or vomiting, urinary symptoms. All other systems are reviewed and are negative.  PHYSICAL EXAM  Patient is oriented to person time and place. Affect is normal. Head is atraumatic. He's here with his wife. There is no jugulovenous distention. Lungs are clear. Respiratory effort is not labored. Cardiac exam her vitals S1 and S2. The abdomen is soft. There is no peripheral edema. There no musculoskeletal deformities. There are no skin rashes.  Filed Vitals:   05/27/14 1452  BP: 102/64  Pulse: 70  Height:  (1.727 m)  Weight: 193 lb (87.544 kg)  SpO2: 98%   EKG is done today and reviewed by me. There is normal sinus rhythm. There is incomplete right bundle branch block.  ASSESSMENT & PLAN

## 2014-05-27 NOTE — Assessment & Plan Note (Signed)
The patient has not had any symptomatic bradycardia. No further workup.

## 2014-05-27 NOTE — Assessment & Plan Note (Signed)
He and his wife feel that his leg cramping may be related to atorvastatin. They feel he did not have this when he was on a more expensive combination medicine with niacin and lovastatin. We will give him a trial off of atorvastatin to see if it helps with his cramping. If it does not we will consider leaving him on atorvastatin. If it does help home most likely changing to simvastatin to help him with the cost.

## 2014-11-22 ENCOUNTER — Telehealth: Payer: Self-pay | Admitting: *Deleted

## 2014-11-22 NOTE — Telephone Encounter (Signed)
Wife called to schedule f/u visit since receiving recall letter. Per wife, patient started a new job and woke up in the middle of the night with left arm pain. Patient took his arthritis medication and also used his nitroglycerin spray and the pain went away. No pain since that time. Patient offered an appointment on 11/28/14 with Myrtis Ser but due to his work schedule it was conflicting. Wife wanted patient seen on a day he is scheduled to be off work. The first available appointment that coincided with patient's work schedule was on 12/02/14 with Diona Browner. Patient's wife aware that Dr. Myrtis Ser is on slow down and will be retiring in September 2016. Wife is okay with patient seeing a different provider.

## 2014-12-02 ENCOUNTER — Encounter: Payer: Self-pay | Admitting: Cardiology

## 2014-12-02 ENCOUNTER — Encounter (HOSPITAL_COMMUNITY): Payer: Self-pay | Admitting: Neurology

## 2014-12-02 ENCOUNTER — Ambulatory Visit (INDEPENDENT_AMBULATORY_CARE_PROVIDER_SITE_OTHER): Payer: BLUE CROSS/BLUE SHIELD | Admitting: Cardiology

## 2014-12-02 ENCOUNTER — Inpatient Hospital Stay (HOSPITAL_COMMUNITY)
Admission: EM | Admit: 2014-12-02 | Discharge: 2014-12-06 | DRG: 287 | Disposition: A | Discharge status: Home / Self Care | Payer: BLUE CROSS/BLUE SHIELD | Attending: Cardiology | Admitting: Cardiology

## 2014-12-02 ENCOUNTER — Other Ambulatory Visit (HOSPITAL_COMMUNITY): Payer: Self-pay

## 2014-12-02 VITALS — BP 134/76 | HR 50 | Ht 68.0 in | Wt 183.4 lb

## 2014-12-02 DIAGNOSIS — E876 Hypokalemia: Secondary | ICD-10-CM | POA: Diagnosis not present

## 2014-12-02 DIAGNOSIS — I2582 Chronic total occlusion of coronary artery: Secondary | ICD-10-CM | POA: Diagnosis present

## 2014-12-02 DIAGNOSIS — Z7982 Long term (current) use of aspirin: Secondary | ICD-10-CM | POA: Diagnosis not present

## 2014-12-02 DIAGNOSIS — I442 Atrioventricular block, complete: Secondary | ICD-10-CM | POA: Diagnosis present

## 2014-12-02 DIAGNOSIS — I2510 Atherosclerotic heart disease of native coronary artery without angina pectoris: Secondary | ICD-10-CM | POA: Diagnosis present

## 2014-12-02 DIAGNOSIS — Z87891 Personal history of nicotine dependence: Secondary | ICD-10-CM | POA: Diagnosis not present

## 2014-12-02 DIAGNOSIS — F319 Bipolar disorder, unspecified: Secondary | ICD-10-CM | POA: Diagnosis present

## 2014-12-02 DIAGNOSIS — I25119 Atherosclerotic heart disease of native coronary artery with unspecified angina pectoris: Secondary | ICD-10-CM

## 2014-12-02 DIAGNOSIS — E782 Mixed hyperlipidemia: Secondary | ICD-10-CM | POA: Diagnosis present

## 2014-12-02 DIAGNOSIS — E785 Hyperlipidemia, unspecified: Secondary | ICD-10-CM | POA: Diagnosis present

## 2014-12-02 DIAGNOSIS — G4733 Obstructive sleep apnea (adult) (pediatric): Secondary | ICD-10-CM | POA: Diagnosis present

## 2014-12-02 DIAGNOSIS — J45909 Unspecified asthma, uncomplicated: Secondary | ICD-10-CM | POA: Diagnosis present

## 2014-12-02 DIAGNOSIS — I10 Essential (primary) hypertension: Secondary | ICD-10-CM | POA: Diagnosis present

## 2014-12-02 DIAGNOSIS — Z951 Presence of aortocoronary bypass graft: Secondary | ICD-10-CM | POA: Diagnosis not present

## 2014-12-02 DIAGNOSIS — R079 Chest pain, unspecified: Secondary | ICD-10-CM

## 2014-12-02 DIAGNOSIS — I441 Atrioventricular block, second degree: Secondary | ICD-10-CM | POA: Diagnosis present

## 2014-12-02 DIAGNOSIS — R001 Bradycardia, unspecified: Secondary | ICD-10-CM | POA: Diagnosis present

## 2014-12-02 DIAGNOSIS — M109 Gout, unspecified: Secondary | ICD-10-CM | POA: Diagnosis present

## 2014-12-02 DIAGNOSIS — R413 Other amnesia: Secondary | ICD-10-CM | POA: Diagnosis present

## 2014-12-02 HISTORY — DX: Obstructive sleep apnea (adult) (pediatric): G47.33

## 2014-12-02 HISTORY — DX: Atrioventricular block, complete: I44.2

## 2014-12-02 LAB — BASIC METABOLIC PANEL
Anion gap: 10 (ref 5–15)
BUN: 18 mg/dL (ref 6–23)
CO2: 24 mmol/L (ref 19–32)
CREATININE: 1.03 mg/dL (ref 0.50–1.35)
Calcium: 9.5 mg/dL (ref 8.4–10.5)
Chloride: 107 mmol/L (ref 96–112)
GFR calc Af Amer: >90 mL/min (ref >90)
GFR, EST NON AFRICAN AMERICAN: 79 mL/min — AB (ref >90)
Glucose, Bld: 110 mg/dL — ABNORMAL HIGH (ref 70–99)
Potassium: 3.5 mmol/L (ref 3.5–5.1)
Sodium: 141 mmol/L (ref 135–145)

## 2014-12-02 LAB — I-STAT TROPONIN, ED: Troponin i, poc: 0 ng/mL (ref 0.00–0.08)

## 2014-12-02 LAB — CBC
HCT: 44.5 % (ref 39.0–52.0)
HEMOGLOBIN: 16 g/dL (ref 13.0–17.0)
MCH: 30.2 pg (ref 26.0–34.0)
MCHC: 36 g/dL (ref 30.0–36.0)
MCV: 84.1 fL (ref 78.0–100.0)
PLATELETS: 574 10*3/uL — AB (ref 150–400)
RBC: 5.29 MIL/uL (ref 4.22–5.81)
RDW: 14.6 % (ref 11.5–15.5)
WBC: 8.3 10*3/uL (ref 4.0–10.5)

## 2014-12-02 LAB — MAGNESIUM: MAGNESIUM: 2.1 mg/dL (ref 1.5–2.5)

## 2014-12-02 LAB — TROPONIN I: Troponin I: <0.03 ng/mL (ref <0.031)

## 2014-12-02 LAB — PROTIME-INR
INR: 1.18 (ref 0.00–1.49)
PROTHROMBIN TIME: 15.2 s (ref 11.6–15.2)

## 2014-12-02 LAB — TSH: TSH: 0.498 u[IU]/mL (ref 0.350–4.500)

## 2014-12-02 MED ORDER — METOPROLOL SUCCINATE ER 25 MG PO TB24
25.0000 mg | ORAL_TABLET | Freq: Two times a day (BID) | ORAL | Status: DC
  Filled 2014-12-02: qty 1

## 2014-12-02 MED ORDER — PANTOPRAZOLE SODIUM 40 MG PO TBEC: 40.0000 mg | DELAYED_RELEASE_TABLET | Freq: Every day | ORAL | Status: DC

## 2014-12-02 MED ORDER — CLONAZEPAM 1 MG PO TABS
1.0000 mg | ORAL_TABLET | Freq: Every day | ORAL | Status: DC
  Administered 2014-12-02 – 2014-12-05 (×4): 1 mg via ORAL
  Filled 2014-12-02 (×4): qty 1

## 2014-12-02 MED ORDER — SODIUM CHLORIDE 0.9 % IJ SOLN
3.0000 mL | Freq: Two times a day (BID) | INTRAMUSCULAR | Status: DC
Start: 2014-12-02 — End: 2014-12-06
  Administered 2014-12-02 – 2014-12-06 (×6): 3 mL via INTRAVENOUS

## 2014-12-02 MED ORDER — QUINAPRIL HCL 10 MG PO TABS
40.0000 mg | ORAL_TABLET | Freq: Every day | ORAL | Status: DC
  Administered 2014-12-03 – 2014-12-06 (×4): 40 mg via ORAL
  Filled 2014-12-02 (×5): qty 4

## 2014-12-02 MED ORDER — QUETIAPINE FUMARATE 50 MG PO TABS
300.0000 mg | ORAL_TABLET | Freq: Every day | ORAL | Status: DC
  Administered 2014-12-02 – 2014-12-05 (×4): 300 mg via ORAL
  Filled 2014-12-02 (×4): qty 6

## 2014-12-02 MED ORDER — ALPRAZOLAM 0.25 MG PO TABS: 0.2500 mg | ORAL_TABLET | Freq: Two times a day (BID) | ORAL | Status: DC | PRN

## 2014-12-02 MED ORDER — LORATADINE 10 MG PO TABS
10.0000 mg | ORAL_TABLET | Freq: Every day | ORAL | Status: DC
  Administered 2014-12-03 – 2014-12-06 (×4): 10 mg via ORAL
  Filled 2014-12-02 (×4): qty 1

## 2014-12-02 MED ORDER — NITROGLYCERIN 0.4 MG SL SUBL: 0.4000 mg | SUBLINGUAL_TABLET | SUBLINGUAL | Status: DC | PRN

## 2014-12-02 MED ORDER — ALLOPURINOL 300 MG PO TABS
300.0000 mg | ORAL_TABLET | Freq: Every day | ORAL | Status: DC
  Administered 2014-12-03 – 2014-12-06 (×4): 300 mg via ORAL
  Filled 2014-12-02 (×4): qty 1

## 2014-12-02 MED ORDER — ENOXAPARIN SODIUM 40 MG/0.4ML ~~LOC~~ SOLN
40.0000 mg | SUBCUTANEOUS | Status: DC
  Administered 2014-12-02 – 2014-12-04 (×3): 40 mg via SUBCUTANEOUS
  Filled 2014-12-02 (×3): qty 0.4

## 2014-12-02 MED ORDER — ACETAMINOPHEN 325 MG PO TABS: 650.0000 mg | ORAL_TABLET | ORAL | Status: DC | PRN

## 2014-12-02 MED ORDER — METOPROLOL SUCCINATE ER 25 MG PO TB24
25.0000 mg | ORAL_TABLET | Freq: Two times a day (BID) | ORAL | Status: DC
  Administered 2014-12-03: 25 mg via ORAL
  Filled 2014-12-02: qty 1

## 2014-12-02 MED ORDER — OCUVITE PO TABS
1.0000 | ORAL_TABLET | Freq: Every day | ORAL | Status: DC
  Administered 2014-12-03 – 2014-12-06 (×4): 1 via ORAL
  Filled 2014-12-02 (×5): qty 1

## 2014-12-02 MED ORDER — ZOLPIDEM TARTRATE 5 MG PO TABS: 5.0000 mg | ORAL_TABLET | Freq: Every evening | ORAL | Status: DC | PRN

## 2014-12-02 MED ORDER — ALBUTEROL SULFATE (2.5 MG/3ML) 0.083% IN NEBU: 2.5000 mg | INHALATION_SOLUTION | Freq: Four times a day (QID) | RESPIRATORY_TRACT | Status: DC | PRN

## 2014-12-02 MED ORDER — SODIUM CHLORIDE 0.9 % IV SOLN: 250.0000 mL | INTRAVENOUS | Status: DC | PRN

## 2014-12-02 MED ORDER — ONDANSETRON HCL 4 MG/2ML IJ SOLN
4.0000 mg | Freq: Four times a day (QID) | INTRAMUSCULAR | Status: DC | PRN
Start: 2014-12-02 — End: 2014-12-06

## 2014-12-02 MED ORDER — PANTOPRAZOLE SODIUM 40 MG PO TBEC
40.0000 mg | DELAYED_RELEASE_TABLET | Freq: Every day | ORAL | Status: DC
  Administered 2014-12-03 – 2014-12-06 (×4): 40 mg via ORAL
  Filled 2014-12-02 (×4): qty 1

## 2014-12-02 MED ORDER — SODIUM CHLORIDE 0.9 % IJ SOLN: 3.0000 mL | INTRAMUSCULAR | Status: DC | PRN

## 2014-12-02 MED ORDER — FUROSEMIDE 20 MG PO TABS: 20.0000 mg | ORAL_TABLET | ORAL | Status: DC | PRN

## 2014-12-02 MED ORDER — ASPIRIN EC 325 MG PO TBEC
325.0000 mg | DELAYED_RELEASE_TABLET | Freq: Every day | ORAL | Status: DC
  Administered 2014-12-03 – 2014-12-06 (×4): 325 mg via ORAL
  Filled 2014-12-02 (×5): qty 1

## 2014-12-02 NOTE — ED Provider Notes (Signed)
CSN: [id number]     Arrival date & time 12/02/14  1547 History   First MD Initiated Contact with Patient 12/02/14 1637     Chief Complaint  Patient presents with  . Bradycardia     (Consider location/radiation/quality/duration/timing/severity/associated sxs/prior Treatment) HPI  This is a 59 year old male with past medical history of hyperlipidemia, coronary disease, hypertension, and tachybradycardia syndrome. He was sent from cardiology clinic for concern of complete heart block.  The patient went to clinic today because he's been having chest pain intermittently for last 2 weeks so that he has been having chest pain since his cath in 2009, at which time he also CABG. The chest pain is intermittent, left-sided, old, last for a few minutes and goes away. Sometimes extends exertional, sometimes it's nonexertional.  No associated symptoms.  For last week, he's also been having this feeling of tiredness.  With the cardiology clinic today for heart rate in the 40s but a good blood pressure, EKG concerning for complete heart block, he was sent to the ED for further evaluation by cardiology.  Past Medical History  Diagnosis Date  . Mixed hyperlipidemia   . CAD (coronary artery disease) 2006    Multivessel status post CABG in [place], patent LIMA to LAD and SVG to diagonal with 40% stenosis 2010  . Essential hypertension   . Asthma   . Gout   . ED (erectile dysfunction)   . Palpitations     Event recorder November 2010, no significant arrhythmias  . Bipolar 1 disorder   . Bradycardia     Very limited Wenkebach on event recorder November 2010  . 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. Obstructive sleep apnea    Past Surgical History  Procedure Laterality Date  . Back surgery      X4  . Coronary artery bypass graft  2006    [place]   Family History  Problem Relation Age of Onset  . COPD Mother   . Lung cancer Mother   . Heart disease Father   . Hypertension Sister   .  Hyperlipidemia Brother   . Hypertension Brother   . Diabetes Brother    History  Substance Use Topics  . Smoking status: Former Smoker -- 2.00 packs/day for 17 years    Types: Cigarettes    Quit date: 09/16/1985  . Smokeless tobacco: Current User  . Alcohol Use: No    Review of Systems  Constitutional: Positive for fatigue. Negative for fever and chills.  Eyes: Negative for redness.  Respiratory: Positive for chest tightness. Negative for cough and shortness of breath.   Cardiovascular: Negative for chest pain.  Gastrointestinal: Negative for nausea, vomiting, abdominal pain and diarrhea.  Genitourinary: Negative for dysuria.  Skin: Negative for rash.  Neurological: Negative for headaches.  All other systems reviewed and are negative.     Allergies  Sulfonamide derivatives  Home Medications   Prior to Admission medications   Medication Sig Start Date End Date Taking? Authorizing Provider  albuterol (PROVENTIL) (2.5 MG/3ML) 0.083% nebulizer solution Take 2.5 mg by nebulization every 6 (six) hours as needed for wheezing.   Yes Historical Provider, MD  allopurinol (ZYLOPRIM) 300 MG tablet Take 300 mg by mouth daily.     Yes Historical Provider, MD  aspirin EC 325 MG tablet Take 325 mg by mouth daily.     Yes Historical Provider, MD  beta carotene w/minerals (OCUVITE) tablet Take 1 tablet by mouth daily.   Yes Historical Provider, MD  cetirizine (ZYRTEC ALLERGY)  10 MG tablet Take 10 mg by mouth daily.     Yes Historical Provider, MD  clonazePAM (KLONOPIN) 1 MG tablet Take 1 mg by mouth at bedtime.    Yes Historical Provider, MD  furosemide (LASIX) 20 MG tablet Take 20 mg by mouth as needed.    Yes Historical Provider, MD  indomethacin (INDOCIN SR) 75 MG CR capsule Take 75 mg by mouth daily.    Yes Historical Provider, MD  metoprolol succinate (TOPROL-XL) 25 MG 24 hr tablet Take 25 mg by mouth 2 (two) times daily.    Yes Historical Provider, MD  nitroGLYCERIN (NITROLINGUAL) 0.4  MG/SPRAY spray Place 1 spray under the tongue every 5 (five) minutes as needed for chest pain. 05/27/14  Yes Luis Abed, MD  omeprazole (PRILOSEC) 20 MG capsule Take 20 mg by mouth daily.     Yes Historical Provider, MD  QUEtiapine (SEROQUEL) 300 MG tablet Take 300 mg by mouth at bedtime.     Yes Historical Provider, MD  quinapril (ACCUPRIL) 40 MG tablet Take 40 mg by mouth daily.   Yes Historical Provider, MD  atorvastatin (LIPITOR) 20 MG tablet Take 1 tablet (20 mg total) by mouth daily. Patient not taking: Reported on 12/02/2014 04/06/14   Luis Abed, MD   BP 143/73 mmHg  Pulse 50  Temp(Src) 97.9 F (36.6 C) (Oral)  Resp 10  Wt 183 lb (83.008 kg)  SpO2 98% Physical Exam  Constitutional: He is oriented to person, place, and time. No distress.  HENT:  Head: Normocephalic and atraumatic.  Eyes: EOM are normal. Pupils are equal, round, and reactive to light.  Neck: Normal range of motion. Neck supple.  Cardiovascular: Normal rate.   Pulmonary/Chest: Effort normal. No respiratory distress.  Abdominal: Soft. There is no tenderness.  Musculoskeletal: Normal range of motion.  Neurological: He is alert and oriented to person, place, and time. No cranial nerve deficit or sensory deficit. Coordination and gait normal.  Skin: No rash noted. He is not diaphoretic.  Psychiatric: He has a normal mood and affect.    ED Course  Procedures (including critical care time) Labs Review Labs Reviewed  CBC - Abnormal; Notable for the following:    Platelets 574 (*)    All other components within normal limits  BASIC METABOLIC PANEL - Abnormal; Notable for the following:    Glucose, Bld 110 (*)    GFR calc non Af Amer 79 (*)    All other components within normal limits  MAGNESIUM  I-STAT TROPOININ, ED    Imaging Review No results found.   EKG Interpretation None      MDM   Final diagnoses:  None    This is a 59 year old male with past medical history of hyperlipidemia,  coronary disease, hypertension, and tachybradycardia syndrome. He was sent from cardiology clinic for concern of complete heart block.  Exam as above, HR in 40-50's, good bp.    Labs obtained, ekg w/o ischemic changes.  Mobitz type I vs. Complete block.  First trop 0.0  Will consult cards for admission.  Cards has seen the patient and will admit for concern for third degree heart block seen in office.    Silas Flood, MD 12/03/14 1219  Cathren Laine, MD 12/03/14 [phone]

## 2014-12-02 NOTE — ED Notes (Signed)
Pt reports went to cardiologist for appt, found to have low HR, feeling dizzy. Sent here for evaluation. Has been having to use nitro spray for CP 1 week ago. Pt is a x 4.

## 2014-12-02 NOTE — Patient Instructions (Signed)
   Please go to [facility] ED.

## 2014-12-02 NOTE — Progress Notes (Signed)
Pt arrived to unit in stable condition, second degree HB type 1 in the 30s to 40s. Bp stable. Pt asymptomatic. Report given to next RN. Pt on monitor

## 2014-12-02 NOTE — ED Notes (Signed)
Attempted report x1. 

## 2014-12-02 NOTE — Progress Notes (Signed)
Cardiology Office Note  Date: 12/02/2014   ID: [name] [name], DOB 1956-04-29, MRN [id number]  PCP: Sissy Hoff, MD  Primary Cardiologist: Dr. Zackery Barefoot  Chief Complaint  Patient presents with  . Coronary Artery Disease  . Recent chest pain    History of Present Illness: [name] [name] is a 59 y.o. male patient of Dr. Myrtis Ser, placed on my schedule as a follow-up today after recent episode of chest discomfort. This is our first meeting in the office. I reviewed extensive records. He is here with his wife today, works the night shift at [employer], just came off a seven-day stretch. He tells me that approximately 2 weeks ago he woke up in the morning with left-sided upper chest pain reminiscent of his angina, also left arm discomfort. He used nitroglycerin spray with relief of symptoms. Since that time he has not specifically had angina, but he has been feeling tired, intermittently dizzy, no palpitations or syncope.  Last ischemic testing was in February 2014 as outlined below. He is status post CABG in 2006 in [place], reportedly with cardiac catheterization in 2010 showing patent LIMA to LAD and patent SVG to diagonal with 40% stenosis that was managed medically.  He reports no recent change in his medications. Follow-up tracing as outlined below shows complete heart block with what looks like a single conducted episode of second-degree type I block. He is in no distress at this time, denies active chest pain, blood pressure is stable. In reviewing the chart, I notice that he has had bradycardia with limited Wenkebach documented in the past.  In light of his recent symptoms and ECG abnormality, I have recommended hospitalization for further workup. He is in agreement.   Past Medical History  Diagnosis Date  . Mixed hyperlipidemia   . CAD (coronary artery disease) 2006    Multivessel status post CABG in [place], patent LIMA to LAD and SVG to diagonal with 40% stenosis  2010  . Essential hypertension   . Asthma   . Gout   . ED (erectile dysfunction)   . Palpitations     Event recorder November 2010, no significant arrhythmias  . Bipolar 1 disorder   . Bradycardia     Very limited Wenkebach on event recorder November 2010  . 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. Obstructive sleep apnea     Past Surgical History  Procedure Laterality Date  . Back surgery      X4  . Coronary artery bypass graft  2006    [place]    Current Outpatient Prescriptions  Medication Sig Dispense Refill  . albuterol (PROVENTIL) (2.5 MG/3ML) 0.083% nebulizer solution Take 2.5 mg by nebulization every 6 (six) hours as needed for wheezing.    [name] allopurinol (ZYLOPRIM) 300 MG tablet Take 300 mg by mouth daily.      [name] aspirin EC 325 MG tablet Take 325 mg by mouth daily.      . beta carotene w/minerals (OCUVITE) tablet Take 1 tablet by mouth daily.    . cetirizine (ZYRTEC ALLERGY) 10 MG tablet Take 10 mg by mouth daily.      . clonazePAM (KLONOPIN) 1 MG tablet Take 1 mg by mouth at bedtime.     . furosemide (LASIX) 20 MG tablet Take 20 mg by mouth as needed.     . indomethacin (INDOCIN SR) 75 MG CR capsule Take 75 mg by mouth daily.     . metoprolol succinate (TOPROL-XL) 25 MG 24 hr tablet Take 25  mg by mouth 2 (two) times daily.     . nitroGLYCERIN (NITROLINGUAL) 0.4 MG/SPRAY spray Place 1 spray under the tongue every 5 (five) minutes as needed for chest pain. 4.9 g 3  . omeprazole (PRILOSEC) 20 MG capsule Take 20 mg by mouth daily.      . QUEtiapine (SEROQUEL) 300 MG tablet Take 300 mg by mouth at bedtime.      . quinapril (ACCUPRIL) 40 MG tablet Take 40 mg by mouth daily.    [name] atorvastatin (LIPITOR) 20 MG tablet Take 1 tablet (20 mg total) by mouth daily. (Patient not taking: Reported on 12/02/2014) 30 tablet 2   No current facility-administered medications for this visit.    Allergies:  Sulfonamide derivatives   Social History: The patient  reports that he quit smoking  about 29 years ago. His smoking use included Cigarettes. He has a 34 pack-year smoking history. He uses smokeless tobacco. He reports that he does not drink alcohol or use illicit drugs.   Family History: The patient's family history includes COPD in his mother; Diabetes in his brother; Heart disease in his father; Hyperlipidemia in his brother; Hypertension in his brother and sister; Lung cancer in his mother.   ROS:  Please see the history of present illness. Otherwise, complete review of systems is positive states was hit in the chest with steel pipe at work, pain from this is not what he experienced a few weeks ago. He has had no frank syncope..  All other systems are reviewed and negative.    Physical Exam: VS:  BP 134/76 mmHg  Pulse 50  Ht  (1.727 m)  Wt 183 lb 6.4 oz (83.19 kg)  BMI 27.89 kg/m2  SpO2 97%, BMI Body mass index is 27.89 kg/(m^2).  Wt Readings from Last 3 Encounters:  12/02/14 183 lb 6.4 oz (83.19 kg)  05/27/14 193 lb (87.544 kg)  11/18/13 226 lb (102.513 kg)     General: No distress. HEENT: Conjunctiva and lids normal, oropharynx clear with moist mucosa. Neck: Supple, no elevated JVP or carotid bruits, no thyromegaly. Lungs: Clear to auscultation, nonlabored breathing at rest. Cardiac: Slow regular rate, no S3, soft systolic murmur, no pericardial rub. Abdomen: Soft, nontender, bowel sounds present, no guarding or rebound. Extremities: No pitting edema, distal pulses 2+. Skin: Warm and dry. Musculoskeletal: No kyphosis. Neuropsychiatric: Alert and oriented x3, affect grossly appropriate.   ECG: ECG is ordered today and reviewed showing sinus bradycardia with complete heart block followed by an episode of what looks like second-degree type I block, no acute ST segment changes. Heart rate 38.   Other Studies Reviewed Today:  Myoview 17-Nov-2012: Quantitative Gated Spect Images QGS EDV: 130 ml QGS ESV: 57 ml  Impression Exercise Capacity: Lexiscan  with low level exercise. BP Response: Normal blood pressure response. Clinical Symptoms: There is chest pain and dyspnea ECG Impression: No significant ST segment change suggestive of ischemia; patient with intermittent Mobitz I during the study. Comparison with Prior Nuclear Study: No images to compare  Overall Impression: Low risk stress nuclear study with a small, moderate intensity, partially reversible anterior defect suggestive of soft tissue attenuation; cannot exclude mild anterior ischemia.  LV Ejection Fraction: 56%. LV Wall Motion: NL LV Function; NL Wall Motion   Assessment and Plan:  1. 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 He is on Toprol-XL 25 mg twice daily, no recent medication adjustments. He is in no distress at this time, but has been  weak with intermittent dizziness recently. Question is whether this is a primary conduction abnormality versus potentially related to progressive ischemic heart disease in light of his episode of angina within the last 2 weeks. He is being sent to the ER at [facility] for admission to our cardiology service. I spoke with Ms. Raford Pitcher PA-C on our service to inform her of the patient's pending arrival. Would recommend holding beta blocker to observe rhythm further, and EP consultation.  2. Recent episode of angina at rest, ECG without acute ST segment changes, but as noted above he does have complete heart block. He will be admitted to the hospital for further evaluation. Ischemic workup will be needed, of note he did have a Myoview in February 2014 that showed an area of anterior ischemia that has been managed medically. Suggest cardiac catheterization for further evaluation.  3. Hyperlipidemia, taken off Lipitor at last visit with Dr. Myrtis Ser in light of possible side effects. Patient is not on statin therapy at this time.  4. Essential hypertension, blood pressure currently stable.   Current  medicines are reviewed at length with the patient today.     Orders Placed This Encounter  Procedures  . EKG 12-Lead    Disposition: Patient is being sent to the [facility] ER for evaluation and admission to our cardiology service.   Signed, Jonelle Sidle, MD, [facility] 12/02/2014 2:42 PM    [facility] at [facility] [address] [place], [place], [place] [address] Phone: [phone]; Fax: [phone]

## 2014-12-02 NOTE — ED Notes (Signed)
MD Steinl at bedside. 

## 2014-12-02 NOTE — ED Notes (Signed)
MD Proulx at bedside.  

## 2014-12-03 DIAGNOSIS — R001 Bradycardia, unspecified: Secondary | ICD-10-CM

## 2014-12-03 DIAGNOSIS — R079 Chest pain, unspecified: Secondary | ICD-10-CM | POA: Diagnosis present

## 2014-12-03 DIAGNOSIS — I10 Essential (primary) hypertension: Secondary | ICD-10-CM

## 2014-12-03 DIAGNOSIS — I441 Atrioventricular block, second degree: Principal | ICD-10-CM

## 2014-12-03 DIAGNOSIS — E785 Hyperlipidemia, unspecified: Secondary | ICD-10-CM

## 2014-12-03 DIAGNOSIS — I442 Atrioventricular block, complete: Secondary | ICD-10-CM

## 2014-12-03 DIAGNOSIS — I2510 Atherosclerotic heart disease of native coronary artery without angina pectoris: Secondary | ICD-10-CM

## 2014-12-03 LAB — COMPREHENSIVE METABOLIC PANEL
ALBUMIN: 3.6 g/dL (ref 3.5–5.2)
ALK PHOS: 37 U/L — AB (ref 39–117)
ALT: 12 U/L (ref 0–53)
ANION GAP: 7 (ref 5–15)
AST: 11 U/L (ref 0–37)
BUN: 17 mg/dL (ref 6–23)
CALCIUM: 8.9 mg/dL (ref 8.4–10.5)
CHLORIDE: 109 mmol/L (ref 96–112)
CO2: 27 mmol/L (ref 19–32)
CREATININE: 0.99 mg/dL (ref 0.50–1.35)
GFR calc Af Amer: >90 mL/min (ref >90)
GFR calc non Af Amer: 90 mL/min — ABNORMAL LOW (ref >90)
Glucose, Bld: 114 mg/dL — ABNORMAL HIGH (ref 70–99)
POTASSIUM: 3.1 mmol/L — AB (ref 3.5–5.1)
Sodium: 143 mmol/L (ref 135–145)
Total Bilirubin: 0.5 mg/dL (ref 0.3–1.2)
Total Protein: 5.3 g/dL — ABNORMAL LOW (ref 6.0–8.3)

## 2014-12-03 LAB — BASIC METABOLIC PANEL
Anion gap: 9 (ref 5–15)
BUN: 14 mg/dL (ref 6–23)
CALCIUM: 9.3 mg/dL (ref 8.4–10.5)
CO2: 26 mmol/L (ref 19–32)
Chloride: 107 mmol/L (ref 96–112)
Creatinine, Ser: 1.03 mg/dL (ref 0.50–1.35)
GFR calc non Af Amer: 79 mL/min — ABNORMAL LOW (ref >90)
GLUCOSE: 86 mg/dL (ref 70–99)
Potassium: 3.8 mmol/L (ref 3.5–5.1)
Sodium: 142 mmol/L (ref 135–145)

## 2014-12-03 LAB — LIPID PANEL
CHOLESTEROL: 92 mg/dL (ref 0–200)
HDL: 33 mg/dL — AB (ref >39)
LDL CALC: 46 mg/dL (ref 0–99)
Total CHOL/HDL Ratio: 2.8 RATIO
Triglycerides: 65 mg/dL (ref <150)
VLDL: 13 mg/dL (ref 0–40)

## 2014-12-03 LAB — TROPONIN I: Troponin I: <0.03 ng/mL (ref <0.031)

## 2014-12-03 LAB — MRSA PCR SCREENING: MRSA BY PCR: NEGATIVE

## 2014-12-03 MED ORDER — POTASSIUM CHLORIDE CRYS ER 20 MEQ PO TBCR
40.0000 meq | EXTENDED_RELEASE_TABLET | Freq: Once | ORAL | Status: AC
  Administered 2014-12-03: 40 meq via ORAL
  Filled 2014-12-03: qty 2

## 2014-12-03 NOTE — Progress Notes (Signed)
SUBJECTIVE:  No complaints  OBJECTIVE:   Vitals:   Filed Vitals:   12/03/14 0400 12/03/14 0817 12/03/14 1027 12/03/14 1146  BP: 110/68  123/82   Pulse: 58  99   Temp: 98 F (36.7 C) 98 F (36.7 C)  98.4 F (36.9 C)  TempSrc: Oral Oral  Oral  Resp: 16     Height:      Weight: 186 lb (84.369 kg)     SpO2: 97%      I&O's:   Intake/Output Summary (Last 24 hours) at 12/03/14 1405 Last data filed at 12/03/14 1100  Gross per 24 hour  Intake    480 ml  Output    800 ml  Net   -320 ml   TELEMETRY: Reviewed telemetry pt in NSR with second degree Type I AV block     PHYSICAL EXAM General: Well developed, well nourished, in no acute distress Head: Eyes PERRLA, No xanthomas.   Normal cephalic and atramatic  Lungs:   Clear bilaterally to auscultation and percussion. Heart:   HRRR S1 S2 Pulses are 2+ & equal. Abdomen: Bowel sounds are positive, abdomen soft and non-tender without masses Extremities:   No clubbing, cyanosis or edema.  DP +1 Neuro: Alert and oriented X 3. Psych:  Good affect, responds appropriately   LABS: Basic Metabolic Panel:  Recent Labs  40/98/11 1619 12/03/14 0741  NA 141 143  K 3.5 3.1*  CL 107 109  CO2 24 27  GLUCOSE 110* 114*  BUN 18 17  CREATININE 1.03 0.99  CALCIUM 9.5 8.9  MG 2.1  --    Liver Function Tests:  Recent Labs  12/03/14 0741  AST 11  ALT 12  ALKPHOS 37*  BILITOT 0.5  PROT 5.3*  ALBUMIN 3.6   No results for input(s): LIPASE, AMYLASE in the last 72 hours. CBC:  Recent Labs  12/02/14 1619  WBC 8.3  HGB 16.0  HCT 44.5  MCV 84.1  PLT 574*   Cardiac Enzymes:  Recent Labs  12/02/14 2025 12/03/14 0135 12/03/14 0741  TROPONINI <0.03 <0.03 <0.03   BNP: Invalid input(s): POCBNP D-Dimer: No results for input(s): DDIMER in the last 72 hours. Hemoglobin A1C: No results for input(s): HGBA1C in the last 72 hours. Fasting Lipid Panel:  Recent Labs  12/03/14 0135  CHOL 92  HDL 33*  LDLCALC 46  TRIG 65    CHOLHDL 2.8   Thyroid Function Tests:  Recent Labs  12/02/14 2025  TSH 0.498   Anemia Panel: No results for input(s): VITAMINB12, FOLATE, FERRITIN, TIBC, IRON, RETICCTPCT in the last 72 hours. Coag Panel:   Lab Results  Component Value Date   INR 1.18 12/02/2014    RADIOLOGY: No results found.  Assessment and Plan:  1. 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 He is on Toprol-XL 25 mg twice daily, no recent medication adjustments. He is in no distress at this time, but has been weak with intermittent dizziness recently. Question is whether this is a primary conduction abnormality versus potentially related to progressive ischemic heart disease in light of his episode of angina within the last 2 weeks.  Beta blocker was supposed to be on hold to observe rhythm further but was never stopped. He continues in second degree type I AV block.   Will d/c BB.  2. Recent episode of angina at rest, ECG without acute ST segment changes, but as noted above he does have complete heart block.He did have  a Myoview in February 2014 that showed an area of anterior ischemia that has been managed medically. Cardiac enzymes negative x 3. Suggest cardiac catheterization for further evaluation on Monday.  3. Hyperlipidemia, taken off Lipitor at last visit with Dr. Myrtis Ser in light of possible side effects. Patient is not on statin therapy at this time.  4. Essential hypertension, blood pressure currently stable on Quinapril  5.  Hypokalemia - replete  Current medicines are reviewed at length with the patient today.    Quintella Reichert, MD  12/03/2014  2:05 PM

## 2014-12-03 NOTE — Progress Notes (Signed)
*  PRELIMINARY RESULTS* Echocardiogram 2D Echocardiogram has been performed.  Jeryl Columbia 12/03/2014, 5:04 PM

## 2014-12-04 ENCOUNTER — Other Ambulatory Visit: Payer: Self-pay

## 2014-12-04 LAB — CBC
HCT: 44.3 % (ref 39.0–52.0)
Hemoglobin: 15.4 g/dL (ref 13.0–17.0)
MCH: 30.2 pg (ref 26.0–34.0)
MCHC: 34.8 g/dL (ref 30.0–36.0)
MCV: 86.9 fL (ref 78.0–100.0)
PLATELETS: 423 10*3/uL — AB (ref 150–400)
RBC: 5.1 MIL/uL (ref 4.22–5.81)
RDW: 15.1 % (ref 11.5–15.5)
WBC: 7.1 10*3/uL (ref 4.0–10.5)

## 2014-12-04 LAB — BASIC METABOLIC PANEL
ANION GAP: 9 (ref 5–15)
Anion gap: 6 (ref 5–15)
BUN: 13 mg/dL (ref 6–23)
BUN: 11 mg/dL (ref 6–23)
CHLORIDE: 109 mmol/L (ref 96–112)
CHLORIDE: 107 mmol/L (ref 96–112)
CO2: 27 mmol/L (ref 19–32)
CO2: 26 mmol/L (ref 19–32)
CREATININE: 1.04 mg/dL (ref 0.50–1.35)
Calcium: 9.1 mg/dL (ref 8.4–10.5)
Calcium: 9.4 mg/dL (ref 8.4–10.5)
Creatinine, Ser: 0.94 mg/dL (ref 0.50–1.35)
GFR calc Af Amer: >90 mL/min (ref >90)
GFR, EST NON AFRICAN AMERICAN: 78 mL/min — AB (ref >90)
Glucose, Bld: 93 mg/dL (ref 70–99)
Glucose, Bld: 116 mg/dL — ABNORMAL HIGH (ref 70–99)
Potassium: 3.8 mmol/L (ref 3.5–5.1)
Potassium: 4 mmol/L (ref 3.5–5.1)
SODIUM: 142 mmol/L (ref 135–145)
Sodium: 142 mmol/L (ref 135–145)

## 2014-12-04 LAB — PROTIME-INR
INR: 1.05 (ref 0.00–1.49)
Prothrombin Time: 13.8 seconds (ref 11.6–15.2)

## 2014-12-04 LAB — PLATELET INHIBITION P2Y12: Platelet Function  P2Y12: 120 [PRU] — ABNORMAL LOW (ref 194–418)

## 2014-12-04 MED ORDER — ASPIRIN 81 MG PO CHEW
81.0000 mg | CHEWABLE_TABLET | ORAL | Status: AC
  Administered 2014-12-05: 81 mg via ORAL
  Filled 2014-12-04: qty 1

## 2014-12-04 MED ORDER — SODIUM CHLORIDE 0.9 % IV SOLN
INTRAVENOUS | Status: DC
  Administered 2014-12-05: 06:00:00 via INTRAVENOUS

## 2014-12-04 MED ORDER — SODIUM CHLORIDE 0.9 % IJ SOLN
3.0000 mL | Freq: Two times a day (BID) | INTRAMUSCULAR | Status: DC
  Administered 2014-12-04 – 2014-12-05 (×2): 3 mL via INTRAVENOUS

## 2014-12-04 MED ORDER — SODIUM CHLORIDE 0.9 % IJ SOLN: 3.0000 mL | INTRAMUSCULAR | Status: DC | PRN

## 2014-12-04 MED ORDER — SODIUM CHLORIDE 0.9 % IV SOLN: 250.0000 mL | INTRAVENOUS | Status: DC | PRN

## 2014-12-04 NOTE — Progress Notes (Signed)
 SUBJECTIVE:  No complaints  OBJECTIVE:   Vitals:   Filed Vitals:   12/04/14 0041 12/04/14 0420 12/04/14 0500 12/04/14 0900  BP: 111/80 114/72  121/81  Pulse: 68 59  62  Temp: 98.6 F (37 C) 98.1 F (36.7 C)  98 F (36.7 C)  TempSrc: Oral Oral  Oral  Resp: 18 20  18  Height:      Weight:   186 lb (84.369 kg)   SpO2: 95% 95%  92%   I&O's:   Intake/Output Summary (Last 24 hours) at 12/04/14 1122 Last data filed at 12/04/14 0900  Gross per 24 hour  Intake    500 ml  Output   1000 ml  Net   -500 ml   TELEMETRY: Reviewed telemetry pt in NSR with Type I second degree AV block     PHYSICAL EXAM General: Well developed, well nourished, in no acute distress Head: Eyes PERRLA, No xanthomas.   Normal cephalic and atramatic  Lungs:   Clear bilaterally to auscultation and percussion. Heart:   HRRR S1 S2 Pulses are 2+ & equal. Abdomen: Bowel sounds are positive, abdomen soft and non-tender without masses Extremities:   No clubbing, cyanosis or edema.  DP +1 Neuro: Alert and oriented X 3. Psych:  Good affect, responds appropriately   LABS: Basic Metabolic Panel:  Recent Labs  12/02/14 1619  12/03/14 1920 12/04/14 0435  NA 141  < > 142 142  K 3.5  < > 3.8 3.8  CL 107  < > 107 109  CO2 24  < > 26 27  GLUCOSE 110*  < > 86 93  BUN 18  < > 14 13  CREATININE 1.03  < > 1.03 0.94  CALCIUM 9.5  < > 9.3 9.1  MG 2.1  --   --   --   < > = values in this interval not displayed. Liver Function Tests:  Recent Labs  12/03/14 0741  AST 11  ALT 12  ALKPHOS 37*  BILITOT 0.5  PROT 5.3*  ALBUMIN 3.6   No results for input(s): LIPASE, AMYLASE in the last 72 hours. CBC:  Recent Labs  12/02/14 1619  WBC 8.3  HGB 16.0  HCT 44.5  MCV 84.1  PLT 574*   Cardiac Enzymes:  Recent Labs  12/02/14 2025 12/03/14 0135 12/03/14 0741  TROPONINI <0.03 <0.03 <0.03   BNP: Invalid input(s): POCBNP D-Dimer: No results for input(s): DDIMER in the last 72 hours. Hemoglobin  A1C: No results for input(s): HGBA1C in the last 72 hours. Fasting Lipid Panel:  Recent Labs  12/03/14 0135  CHOL 92  HDL 33*  LDLCALC 46  TRIG 65  CHOLHDL 2.8   Thyroid Function Tests:  Recent Labs  12/02/14 2025  TSH 0.498   Anemia Panel: No results for input(s): VITAMINB12, FOLATE, FERRITIN, TIBC, IRON, RETICCTPCT in the last 72 hours. Coag Panel:   Lab Results  Component Value Date   INR 1.18 12/02/2014    RADIOLOGY: No results found.  Assessment and Plan:  1. 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 He was on Toprol-XL 25 mg twice daily but he states that he ran out of this a week before he saw Dr. McDowell.  He got it refilled but only took 1 tablet the day before he went to see Dr. McDowell. He has been weak with intermittent dizziness recently. Question is whether this is a primary conduction abnormality versus potentially related to progressive ischemic heart   disease in light of his episode of angina within the last 2 weeks. BB d/c'd but he did get 1 dose on initial admission.  Will get EP to see in the am.  Currently looks mainly like type I second degree AV block.  2. Recent episode of angina at rest, ECG without acute ST segment changes, but as noted above he does have high grade AV heart block with some CHB and Type I Wenkebach block. He did have a Myoview in February 2014 that showed an area of anterior ischemia that has been managed medically. Cardiac enzymes negative x 3. Suggest cardiac catheterization for further evaluation on Monday.  Cardiac catheterization was discussed with the patient fully including risks on myocardial infarction, death, stroke, bleeding, arrhythmia, dye allergy, renal insufficiency or bleeding.  All patient questions and concerns were discussed and the patient understands and is willing to proceed.     3. Hyperlipidemia, taken off Lipitor at last visit with Dr. Katz in light of possible side  effects. Patient is not on statin therapy at this time.  LDL at goal  4. Essential hypertension, blood pressure currently stable on Quinapril  5. Hypokalemia - repleted      Mirielle Byrum R, MD  12/04/2014  11:22 AM  

## 2014-12-05 ENCOUNTER — Encounter (HOSPITAL_COMMUNITY)
Admission: EM | Disposition: A | Discharge status: Home / Self Care | Payer: Self-pay | Source: Home / Self Care | Attending: Cardiology

## 2014-12-05 ENCOUNTER — Inpatient Hospital Stay (HOSPITAL_COMMUNITY): Payer: BLUE CROSS/BLUE SHIELD

## 2014-12-05 DIAGNOSIS — R079 Chest pain, unspecified: Secondary | ICD-10-CM

## 2014-12-05 HISTORY — PX: LEFT HEART CATHETERIZATION WITH CORONARY/GRAFT ANGIOGRAM: SHX5450

## 2014-12-05 LAB — HEMOGLOBIN A1C
Hgb A1c MFr Bld: 5.3 % (ref 4.8–5.6)
Mean Plasma Glucose: 105 mg/dL

## 2014-12-05 LAB — CBC
HEMATOCRIT: 43.6 % (ref 39.0–52.0)
Hemoglobin: 15 g/dL (ref 13.0–17.0)
MCH: 30 pg (ref 26.0–34.0)
MCHC: 34.4 g/dL (ref 30.0–36.0)
MCV: 87.2 fL (ref 78.0–100.0)
PLATELETS: 409 10*3/uL — AB (ref 150–400)
RBC: 5 MIL/uL (ref 4.22–5.81)
RDW: 15.1 % (ref 11.5–15.5)
WBC: 6.3 10*3/uL (ref 4.0–10.5)

## 2014-12-05 LAB — CREATININE, SERUM
Creatinine, Ser: 0.95 mg/dL (ref 0.50–1.35)
GFR calc Af Amer: >90 mL/min (ref >90)
GFR calc non Af Amer: >90 mL/min (ref >90)

## 2014-12-05 SURGERY — LEFT HEART CATHETERIZATION WITH CORONARY/GRAFT ANGIOGRAM

## 2014-12-05 MED ORDER — LIDOCAINE HCL (PF) 1 % IJ SOLN
INTRAMUSCULAR | Status: AC
  Filled 2014-12-05: qty 30

## 2014-12-05 MED ORDER — VERAPAMIL HCL 2.5 MG/ML IV SOLN
INTRAVENOUS | Status: AC
  Filled 2014-12-05: qty 2

## 2014-12-05 MED ORDER — ENOXAPARIN SODIUM 40 MG/0.4ML ~~LOC~~ SOLN
40.0000 mg | SUBCUTANEOUS | Status: DC
  Administered 2014-12-06: 40 mg via SUBCUTANEOUS
  Filled 2014-12-05: qty 0.4

## 2014-12-05 MED ORDER — NITROGLYCERIN 1 MG/10 ML FOR IR/CATH LAB
INTRA_ARTERIAL | Status: AC
  Filled 2014-12-05: qty 10

## 2014-12-05 MED ORDER — FENTANYL CITRATE 0.05 MG/ML IJ SOLN
INTRAMUSCULAR | Status: AC
  Filled 2014-12-05: qty 2

## 2014-12-05 MED ORDER — SODIUM CHLORIDE 0.9 % IV SOLN: 1.0000 mL/kg/h | INTRAVENOUS | Status: AC

## 2014-12-05 MED ORDER — HEPARIN (PORCINE) IN NACL 2-0.9 UNIT/ML-% IJ SOLN
INTRAMUSCULAR | Status: AC
Start: 2014-12-05 — End: 2014-12-05
  Filled 2014-12-05: qty 1000

## 2014-12-05 MED ORDER — MIDAZOLAM HCL 2 MG/2ML IJ SOLN
INTRAMUSCULAR | Status: AC
  Filled 2014-12-05: qty 2

## 2014-12-05 NOTE — Care Management Note (Unsigned)
    Page 1 of 1   12/05/2014     3:15:00 PM CARE MANAGEMENT NOTE 12/05/2014  Patient:  [name] [name],[name] [name]   Account Number:  [id number]  Date Initiated:  12/05/2014  Documentation initiated by:  GRAVES-BIGELOW,[place]  Subjective/Objective Assessment:   Pt admitted for CHB. Plan for cath.     Action/Plan:   CM will continue to monitor for disposition needs.   Anticipated DC Date:  12/07/2014   Anticipated DC Plan:  HOME/SELF CARE      DC Planning Services  CM consult      Choice offered to / List presented to:             Status of service:  In process, will continue to follow Medicare Important Message given?  NO (If response is "NO", the following Medicare IM given date fields will be blank) Date Medicare IM given:   Medicare IM given by:   Date Additional Medicare IM given:   Additional Medicare IM given by:    Discharge Disposition:    Per UR Regulation:  Reviewed for med. necessity/level of care/duration of stay  If discussed at Long Length of Stay Meetings, dates discussed:    Comments:

## 2014-12-05 NOTE — H&P (View-Only) (Signed)
SUBJECTIVE:  No complaints  OBJECTIVE:   Vitals:   Filed Vitals:   12/04/14 0041 12/04/14 0420 12/04/14 0500 12/04/14 0900  BP: 111/80 114/72  121/81  Pulse: 68 59  62  Temp: 98.6 F (37 C) 98.1 F (36.7 C)  98 F (36.7 C)  TempSrc: Oral Oral  Oral  Resp: 18 20  18   Height:      Weight:   186 lb (84.369 kg)   SpO2: 95% 95%  92%   I&O's:   Intake/Output Summary (Last 24 hours) at 12/04/14 1122 Last data filed at 12/04/14 0900  Gross per 24 hour  Intake    500 ml  Output   1000 ml  Net   -500 ml   TELEMETRY: Reviewed telemetry pt in NSR with Type I second degree AV block     PHYSICAL EXAM General: Well developed, well nourished, in no acute distress Head: Eyes PERRLA, No xanthomas.   Normal cephalic and atramatic  Lungs:   Clear bilaterally to auscultation and percussion. Heart:   HRRR S1 S2 Pulses are 2+ & equal. Abdomen: Bowel sounds are positive, abdomen soft and non-tender without masses Extremities:   No clubbing, cyanosis or edema.  DP +1 Neuro: Alert and oriented X 3. Psych:  Good affect, responds appropriately   LABS: Basic Metabolic Panel:  Recent Labs  83/15/17 1619  12/03/14 1920 12/04/14 0435  NA 141  < > 142 142  K 3.5  < > 3.8 3.8  CL 107  < > 107 109  CO2 24  < > 26 27  GLUCOSE 110*  < > 86 93  BUN 18  < > 14 13  CREATININE 1.03  < > 1.03 0.94  CALCIUM 9.5  < > 9.3 9.1  MG 2.1  --   --   --   < > = values in this interval not displayed. Liver Function Tests:  Recent Labs  12/03/14 0741  AST 11  ALT 12  ALKPHOS 37*  BILITOT 0.5  PROT 5.3*  ALBUMIN 3.6   No results for input(s): LIPASE, AMYLASE in the last 72 hours. CBC:  Recent Labs  12/02/14 1619  WBC 8.3  HGB 16.0  HCT 44.5  MCV 84.1  PLT 574*   Cardiac Enzymes:  Recent Labs  12/02/14 2025 12/03/14 0135 12/03/14 0741  TROPONINI <0.03 <0.03 <0.03   BNP: Invalid input(s): POCBNP D-Dimer: No results for input(s): DDIMER in the last 72 hours. Hemoglobin  A1C: No results for input(s): HGBA1C in the last 72 hours. Fasting Lipid Panel:  Recent Labs  12/03/14 0135  CHOL 92  HDL 33*  LDLCALC 46  TRIG 65  CHOLHDL 2.8   Thyroid Function Tests:  Recent Labs  12/02/14 2025  TSH 0.498   Anemia Panel: No results for input(s): VITAMINB12, FOLATE, FERRITIN, TIBC, IRON, RETICCTPCT in the last 72 hours. Coag Panel:   Lab Results  Component Value Date   INR 1.18 12/02/2014    RADIOLOGY: No results found.  Assessment and Plan:  1. 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 He was on Toprol-XL 25 mg twice daily but he states that he ran out of this a week before he saw Dr. Diona Browner.  He got it refilled but only took 1 tablet the day before he went to see Dr. Diona Browner. He has been weak with intermittent dizziness recently. Question is whether this is a primary conduction abnormality versus potentially related to progressive ischemic heart  disease in light of his episode of angina within the last 2 weeks. BB d/c'd but he did get 1 dose on initial admission.  Will get EP to see in the am.  Currently looks mainly like type I second degree AV block.  2. Recent episode of angina at rest, ECG without acute ST segment changes, but as noted above he does have high grade AV heart block with some CHB and Type I Wenkebach block. He did have a Myoview in February 2014 that showed an area of anterior ischemia that has been managed medically. Cardiac enzymes negative x 3. Suggest cardiac catheterization for further evaluation on Monday.  Cardiac catheterization was discussed with the patient fully including risks on myocardial infarction, death, stroke, bleeding, arrhythmia, dye allergy, renal insufficiency or bleeding.  All patient questions and concerns were discussed and the patient understands and is willing to proceed.     3. Hyperlipidemia, taken off Lipitor at last visit with Dr. Myrtis Ser in light of possible side  effects. Patient is not on statin therapy at this time.  LDL at goal  4. Essential hypertension, blood pressure currently stable on Quinapril  5. Hypokalemia - repleted      Quintella Reichert, MD  12/04/2014  11:22 AM

## 2014-12-05 NOTE — Consult Note (Signed)
Reason for Consult:   Wenckebach  Requesting Physician: Dr Mayford Knife  HPI: This is a 59 y.o.Rt handed male, works at the [employer],  with a past medical history significant for CAD. He is s/p CABG x 2 in 2006 ([place]) with an LIMA-LAD and SVG-Dx. Cath in 2010 after an abnormal Myoview revealed patent grafts and no significant CFX or RCA disease. He is admitted now with chest pain after being seen by Dr Diona Browner as an OP on 12/02/14. His EKG then apparently showed CHB and Wenckebach. He does have a history of 1 st degree AVB and Wenckebach in the past but was told he didn't need a pacemaker at that time. He had been on Toprol 25 mg BID for some time. Recently he says his prescription "got messed up" and he was only given enough Toprol for two weeks. He was out of Toprol for two weeks and says he couldn't tell any difference in how he felt. He admits to being dizzy when walking in from his car to work. This has been going on for months. There is no history of syncope or collapse. His chest pain is non exertional and localized to his Lt lower anterior chest. Troponin have been negative. He continues to have bradycardia, 1 st degree AVB, and Wenckebach on telemetry. He is for cath this am.   PMHx:  Past Medical History  Diagnosis Date  . Mixed hyperlipidemia   . Essential hypertension   . Asthma   . Gout   . ED (erectile dysfunction)   . Palpitations     Event recorder November 2010, no significant arrhythmias  . Bipolar 1 disorder   . Bradycardia     Very limited Wenkebach on event recorder November 2010  . 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. CAD (coronary artery disease) 2006    Multivessel status post CABG in [place], patent LIMA to LAD and SVG to diagonal with 40% stenosis 2010  . OSA (obstructive sleep apnea)     patient denies, stated had a sleep study and he was told he did not have      Past Surgical History  Procedure Laterality Date  . Back surgery      X4    . Coronary artery bypass graft  2006    [place]    SOCHx:  reports that he quit smoking about 29 years ago. His smoking use included Cigarettes. He has a 34 pack-year smoking history. His smokeless tobacco use includes Snuff. He reports that he does not drink alcohol or use illicit drugs.  FAMHx: Family History  Problem Relation Age of Onset  . COPD Mother   . Lung cancer Mother   . Stroke Mother   . Heart attack Mother   . Heart disease Father   . Heart attack Father   . Hyperlipidemia Brother   . Hypertension Brother   . Hypertension Brother   . Hypertension Sister     ALLERGIES: Allergies  Allergen Reactions  . Sulfonamide Derivatives     REACTION: hives    ROS: Pertinent items are noted in HPI. See H&P for complete ROS  HOME MEDICATIONS: Prior to Admission medications   Medication Sig Start Date End Date Taking? Authorizing Provider  albuterol (PROVENTIL) (2.5 MG/3ML) 0.083% nebulizer solution Take 2.5 mg by nebulization every 6 (six) hours as needed for wheezing.   Yes Historical Provider, MD  allopurinol (ZYLOPRIM) 300 MG tablet Take 300 mg by mouth daily.  Yes Historical Provider, MD  aspirin EC 325 MG tablet Take 325 mg by mouth daily.     Yes Historical Provider, MD  beta carotene w/minerals (OCUVITE) tablet Take 1 tablet by mouth daily.   Yes Historical Provider, MD  cetirizine (ZYRTEC ALLERGY) 10 MG tablet Take 10 mg by mouth daily.     Yes Historical Provider, MD  clonazePAM (KLONOPIN) 1 MG tablet Take 1 mg by mouth at bedtime.    Yes Historical Provider, MD  furosemide (LASIX) 20 MG tablet Take 20 mg by mouth as needed.    Yes Historical Provider, MD  indomethacin (INDOCIN SR) 75 MG CR capsule Take 75 mg by mouth daily.    Yes Historical Provider, MD  metoprolol succinate (TOPROL-XL) 25 MG 24 hr tablet Take 25 mg by mouth 2 (two) times daily.    Yes Historical Provider, MD  nitroGLYCERIN (NITROLINGUAL) 0.4 MG/SPRAY spray Place 1 spray under the tongue  every 5 (five) minutes as needed for chest pain. 05/27/14  Yes Luis Abed, MD  omeprazole (PRILOSEC) 20 MG capsule Take 20 mg by mouth daily.     Yes Historical Provider, MD  QUEtiapine (SEROQUEL) 300 MG tablet Take 300 mg by mouth at bedtime.     Yes Historical Provider, MD  quinapril (ACCUPRIL) 40 MG tablet Take 40 mg by mouth daily.   Yes Historical Provider, MD  atorvastatin (LIPITOR) 20 MG tablet Take 1 tablet (20 mg total) by mouth daily. Patient not taking: Reported on 12/02/2014 04/06/14   Luis Abed, MD    HOSPITAL MEDICATIONS: I have reviewed the patient's current medications.  VITALS: Blood pressure 113/72, pulse 57, temperature 97.7 F (36.5 C), temperature source Oral, resp. rate 18, height  (1.727 m), weight 181 lb 12.8 oz (82.464 kg), SpO2 95 %.  PHYSICAL EXAM: General appearance: alert, cooperative and no distress Neck: no carotid bruit and no JVD Lungs: clear to auscultation bilaterally Heart: regular rate and rhythm Abdomen: soft, non-tender; bowel sounds normal; no masses,  no organomegaly Extremities: extremities normal, atraumatic, no cyanosis or edema Pulses: 2+ and symmetric Skin: Skin color, texture, turgor normal. No rashes or lesions Neurologic: Grossly normal  LABS: Results for orders placed or performed during the hospital encounter of 12/02/14 (from the past 24 hour(s))  Basic metabolic panel     Status: Abnormal   Collection Time: 12/04/14  8:05 PM  Result Value Ref Range   Sodium 142 135 - 145 mmol/L   Potassium 4.0 3.5 - 5.1 mmol/L   Chloride 107 96 - 112 mmol/L   CO2 26 19 - 32 mmol/L   Glucose, Bld 116 (H) 70 - 99 mg/dL   BUN 11 6 - 23 mg/dL   Creatinine, Ser 4.09 0.50 - 1.35 mg/dL   Calcium 9.4 8.4 - 81.1 mg/dL   GFR calc non Af Amer 78 (L) >90 mL/min   GFR calc Af Amer >90 >90 mL/min   Anion gap 9 5 - 15  Protime-INR     Status: None   Collection Time: 12/04/14  8:05 PM  Result Value Ref Range   Prothrombin Time 13.8 11.6 -  15.2 seconds   INR 1.05 0.00 - 1.49  CBC     Status: Abnormal   Collection Time: 12/04/14  8:05 PM  Result Value Ref Range   WBC 7.1 4.0 - 10.5 K/uL   RBC 5.10 4.22 - 5.81 MIL/uL   Hemoglobin 15.4 13.0 - 17.0 g/dL   HCT 91.4 78.2 - 95.6 %  MCV 86.9 78.0 - 100.0 fL   MCH 30.2 26.0 - 34.0 pg   MCHC 34.8 30.0 - 36.0 g/dL   RDW 10.9 32.3 - 55.7 %   Platelets 423 (H) 150 - 400 K/uL  Platelet inhibition p2y12 (if on daily thienopyridine)     Status: Abnormal   Collection Time: 12/04/14  8:05 PM  Result Value Ref Range   Platelet Function  P2Y12 120 (L) 194 - 418 PRU    EKG: 12/04/14- NSR 1 st degree AVB, incomplete RBBB  IMAGING: No results found.  IMPRESSION: Principal Problem:   Chest pain with moderate risk of acute coronary syndrome Active Problems:   Bradycardia   Second degree AV block, Mobitz type I   CHB (complete heart block)   CAD- CABG x 2 2006. Patent grafts 2010   Dyslipidemia   HTN CVD- LVH, grade 1 DD, EF 60-65% by echo 12/03/14   Asthma   Bipolar 1 disorder   Obstructive sleep apnea   Memory loss-SVD on MRI March 2015   RECOMMENDATION: Pt is for cath today. Dr Ladona Ridgel to see. Beta blocker has been stopped. He needs a PA and Lat CXR.   Time Spent Directly with Patient: 45 minutes  [name] [name] [phone] beeper 12/05/2014, 10:22 AM   EP Attending  Patient seen and examined. Agree with above history, exam, assessment and plan. The patient has had AVWB, and possible CHB but we have no documentation. He is not clearly symptomatic and has been on beta blockers, which have been stopped. I have recommended a period of watchful waiting. While he may ultimately require PPM, there is no clear indication at this time. He can be discharged. If he develops syncope or symptomatic bradycardia off of all AV nodal blocking drugs then PPM is indicated.  Leonia Reeves.D.

## 2014-12-05 NOTE — CV Procedure (Addendum)
   Cardiac Catheterization Procedure Note  Name: [name] [name] MRN: [id number] DOB: 04-Sep-1956  Procedure: Left Heart Cath, Selective Coronary Angiography,  SVG and LIMA angiography,  LV angiography  Indication: 59 yo WM s/p CABG presents with chest pain and syncope.   Procedural details: Left radial access was attempted without success. The radial artery by ultrasound was tiny.The right groin was prepped, draped, and anesthetized with 1% lidocaine. Using modified Seldinger technique, a 5 French sheath was introduced into the right femoral artery. Standard Judkins catheters were used for coronary angiography and left ventriculography. Catheter exchanges were performed over a guidewire. There were no immediate procedural complications. The patient was transferred to the post catheterization recovery area for further monitoring.  Procedural Findings: Hemodynamics:  AO 134/78 mean 102 mm Hg LV 138/10 mm Hg   Coronary angiography: Coronary dominance: right  Left mainstem: Normal  Left anterior descending (LAD): 100% occlusion proximally.  Left circumflex (LCx): Normal  Right coronary artery (RCA): Normal  SVG to the diagonal is widely patent.   LIMA to the LAD is widely patent.   Left ventriculography: Left ventricular systolic function is normal, LVEF is estimated at 55-65%, there is no significant mitral regurgitation   Final Conclusions:   1. Single vessel occlusive CAD\ 2. Patent LIMA to the LAD 3. Patent SVG to the diagonal 4. Normal LV function.  Recommendations: continue medical management.   Peter [place], MDFACC 12/05/2014, 4:32 PM

## 2014-12-05 NOTE — Progress Notes (Signed)
Site area: right groina 5 french arterial sheath removed  Site Prior to Removal:  Level 0  Pressure Applied For 20 MINUTES    Minutes Beginning at 1715p  Manual:   Yes.    Patient Status During Pull:  stable  Post Pull Groin Site:  Level 0  Post Pull Instructions Given:  Yes.    Post Pull Pulses Present:  Yes.    Dressing Applied:  Yes.    Comments:  VS remain stable at this time.  Pt denies any discomfort at this time at site.

## 2014-12-05 NOTE — Interval H&P Note (Signed)
History and Physical Interval Note:  12/05/2014 3:52 PM  [name] [name]  has presented today for surgery, with the diagnosis of CHB  The various methods of treatment have been discussed with the patient and family. After consideration of risks, benefits and other options for treatment, the patient has consented to  Procedure(s): LEFT HEART CATHETERIZATION WITH CORONARY/GRAFT ANGIOGRAM (N/A) as a surgical intervention .  The patient's history has been reviewed, patient examined, no change in status, stable for surgery.  I have reviewed the patient's chart and labs.  Questions were answered to the patient's satisfaction.    Cath Lab Visit (complete for each Cath Lab visit)  Clinical Evaluation Leading to the Procedure:   ACS: Yes.    Non-ACS:    Anginal Classification: CCS III  Anti-ischemic medical therapy: Maximal Therapy (2 or more classes of medications)  Non-Invasive Test Results: No non-invasive testing performed  Prior CABG: Previous CABG       Theron Arista [facility] 12/05/2014 3:52 PM

## 2014-12-06 ENCOUNTER — Encounter (HOSPITAL_COMMUNITY): Payer: Self-pay | Admitting: Cardiology

## 2014-12-06 ENCOUNTER — Encounter: Payer: Self-pay | Admitting: Nurse Practitioner

## 2014-12-06 NOTE — Progress Notes (Signed)
Patient ID: DARYN HICKS, male   DOB: 02-Sep-1956, 59 y.o.   MRN: [id number]    Patient Name: [name] [name] Date of Encounter: 12/06/2014     Principal Problem:   Chest pain with moderate risk of acute coronary syndrome Active Problems:   Dyslipidemia   CAD- CABG x 2 2006. Patent grafts 2010   HTN CVD- LVH, grade 1 DD, EF 60-65% by echo 12/03/14   Asthma   Bipolar 1 disorder   Bradycardia   Second degree AV block, Mobitz type I   Obstructive sleep apnea   Memory loss-SVD on MRI March 2015   CHB (complete heart block)    SUBJECTIVE  Feeling well after catheterization.   CURRENT MEDS . allopurinol  300 mg Oral Daily  . aspirin EC  325 mg Oral Daily  . beta carotene w/minerals  1 tablet Oral Daily  . clonazePAM  1 mg Oral QHS  . enoxaparin (LOVENOX) injection  40 mg Subcutaneous Q24H  . loratadine  10 mg Oral Daily  . pantoprazole  40 mg Oral Daily  . QUEtiapine  300 mg Oral QHS  . quinapril  40 mg Oral Daily  . sodium chloride  3 mL Intravenous Q12H    OBJECTIVE  Filed Vitals:   12/06/14 0000 12/06/14 0034 12/06/14 0445 12/06/14 0749  BP:  135/76 105/68 108/76  Pulse: 73 74 63 71  Temp:  98 F (36.7 C) 97.6 F (36.4 C) 98 F (36.7 C)  TempSrc:  Oral Oral Oral  Resp: Height:      Weight:   185 lb 11.2 oz (84.233 kg)   SpO2: 97% 97% 95% 96%    Intake/Output Summary (Last 24 hours) at 12/06/14 0809 Last data filed at 12/06/14 0751  Gross per 24 hour  Intake    720 ml  Output   1650 ml  Net   -930 ml   Filed Weights   12/04/14 0500 12/05/14 0400 12/06/14 0445  Weight: 186 lb (84.369 kg) 181 lb 12.8 oz (82.464 kg) 185 lb 11.2 oz (84.233 kg)    PHYSICAL EXAM  General: Pleasant, NAD. Neuro: Alert and oriented X 3. Moves all extremities spontaneously. Psych: Normal affect. HEENT:  Normal  Neck: Supple without bruits or JVD. Lungs:  Resp regular and unlabored, CTA. Heart: RRR no s3, s4, or murmurs. Abdomen: Soft, non-tender,  non-distended, BS + x 4.  Extremities: No clubbing, cyanosis or edema. DP/PT/Radials 2+ and equal bilaterally.  Accessory Clinical Findings  CBC  Recent Labs  12/04/14 2005 12/05/14 1930  WBC 7.1 6.3  HGB 15.4 15.0  HCT 44.3 43.6  MCV 86.9 87.2  PLT 423* 409*   Basic Metabolic Panel  Recent Labs  12/04/14 0435 12/04/14 2005 12/05/14 1930  NA 142 142  --   K 3.8 4.0  --   CL 109 107  --   CO2 27 26  --   GLUCOSE 93 116*  --   BUN 13 11  --   CREATININE 0.94 1.04 0.95  CALCIUM 9.1 9.4  --    Liver Function Tests No results for input(s): AST, ALT, ALKPHOS, BILITOT, PROT, ALBUMIN in the last 72 hours. No results for input(s): LIPASE, AMYLASE in the last 72 hours. Cardiac Enzymes No results for input(s): CKTOTAL, CKMB, CKMBINDEX, TROPONINI in the last 72 hours. BNP Invalid input(s): POCBNP D-Dimer No results for input(s): DDIMER in the last 72 hours. Hemoglobin A1C No results for input(s): HGBA1C in the  last 72 hours. Fasting Lipid Panel No results for input(s): CHOL, HDL, LDLCALC, TRIG, CHOLHDL, LDLDIRECT in the last 72 hours. Thyroid Function Tests No results for input(s): TSH, T4TOTAL, T3FREE, THYROIDAB in the last 72 hours.  Invalid input(s): FREET3  TELE  Nsr/sinus brady  Radiology/Studies  Dg Chest 2 View  12/05/2014   CLINICAL DATA:  Preop for heart catheterization  EXAM: CHEST  2 VIEW  COMPARISON:  06/10/2009  FINDINGS: Cardiomediastinal silhouette is stable. Status post CABG. No acute infiltrate or pleural effusion. No pulmonary edema. Bony thorax is unremarkable.  IMPRESSION: No active cardiopulmonary disease.   Electronically Signed   By: Natasha Mead M.D.   On: 12/05/2014 11:30    ASSESSMENT AND PLAN  1. Bradycardia 2. S/p catheterization 3. Single vessel CAD, s/p LIMA to LAD Rec: he has had no additional bradycardia since stopping his beta blocker. He is not symptomatic. I have recommended a period of watchful waiting. I'll see back in the  office in several weeks. Ok for discharge home.  Gregg Taylor,M.D.  12/06/2014 8:09 AM

## 2014-12-06 NOTE — Discharge Instructions (Signed)
Radial Site Care °Refer to this sheet in the next few weeks. These instructions provide you with information on caring for yourself after your procedure. Your caregiver may also give you more specific instructions. Your treatment has been planned according to current medical practices, but problems sometimes occur. Call your caregiver if you have any problems or questions after your procedure. °HOME CARE INSTRUCTIONS °· You may shower the day after the procedure. Remove the bandage (dressing) and gently wash the site with plain soap and water. Gently pat the site dry. °· Do not apply powder or lotion to the site. °· Do not submerge the affected site in water for 3 to 5 days. °· Inspect the site at least twice daily. °· Do not flex or bend the affected arm for 24 hours. °· No lifting over 5 pounds (2.3 kg) for 5 days after your procedure. °· Do not drive home if you are discharged the same day of the procedure. Have someone else drive you. °· You may drive 24 hours after the procedure unless otherwise instructed by your caregiver. °· Do not operate machinery or power tools for 24 hours. °· A responsible adult should be with you for the first 24 hours after you arrive home. °What to expect: °· Any bruising will usually fade within 1 to 2 weeks. °· Blood that collects in the tissue (hematoma) may be painful to the touch. It should usually decrease in size and tenderness within 1 to 2 weeks. °SEEK IMMEDIATE MEDICAL CARE IF: °· You have unusual pain at the radial site. °· You have redness, warmth, swelling, or pain at the radial site. °· You have drainage (other than a small amount of blood on the dressing). °· You have chills. °· You have a fever or persistent symptoms for more than 72 hours. °· You have a fever and your symptoms suddenly get worse. °· Your arm becomes pale, cool, tingly, or numb. °· You have heavy bleeding from the site. Hold pressure on the site. °Document Released: 10/05/2010 Document Revised:  11/25/2011 Document Reviewed: 10/05/2010 °ExitCare® Patient Information ©2015 [employer]. This information is not intended to replace advice given to you by your health care provider. Make sure you discuss any questions you have with your health care provider. ° °

## 2014-12-06 NOTE — Discharge Summary (Signed)
ELECTROPHYSIOLOGY PROCEDURE DISCHARGE SUMMARY    Patient ID: [name] [name],  MRN: [id number], DOB/AGE: 57/06/1956 57 y.o.  Admit date: 12/02/2014 Discharge date: 12/06/2014  Primary Care Physician: Sissy Hoff, MD Primary Cardiologist: Myrtis Ser Electrophysiologist: Ladona Ridgel  Primary Discharge Diagnosis:  Chest pain  Bradycardia  Secondary Discharge Diagnosis:  1.  CAD s/p CABG - cath this admission demonstrated no progression of CAD 2.  Hypertension 3.  Hyperlipidemia 4.  Bipolar disorder   Allergies  Allergen Reactions  . Sulfonamide Derivatives     REACTION: hives     Procedures This Admission:  1.  Echocardiogram 12-03-14 demonstrated EF 60-65%, grade 1 diastolic dysfunction, LA 47.  2.  Cardiac catheterization on 12-05-14 by Dr Swaziland demonstrated EF 55-60%, single vessel occlusive CAD, patent LIMA to LAD, patent SVG to diagonal.   Brief HPI: [name] [name] is a 59 y.o. male with a past medical history as outlined above.  He was seen in the [place] office 12-02-14 for evaluation of chest pain.  He had also had intermittent dizziness and Mobitz I heart block on EKG and was referred to [facility] for further evaluation.   Hospital Course:  He was admitted and underwent cardiac catheterization with details as outlined above.  His Toprol was held and telemetry demonstrated sinus rhythm with no further heart block.  He was evaluated by Dr Ladona Ridgel who recommended that with asymptomatic Mobitz I heart block resolved off beta blocker therapy, no further EP intervention was needed.  He ambulated without difficulty and was evaluated by Dr Ladona Ridgel and considered stable for discharge to home.  He will remain off of beta blocker therapy and follow up with Dr Ladona Ridgel in the [place] office.   Physical Exam: Filed Vitals:   12/06/14 0000 12/06/14 0034 12/06/14 0445 12/06/14 0749  BP:  135/76 105/68 108/76  Pulse: 73 74 63 71  Temp:  98 F (36.7 C) 97.6 F (36.4 C) 98 F (36.7 C)    TempSrc:  Oral Oral Oral  Resp: Height:      Weight:   185 lb 11.2 oz (84.233 kg)   SpO2: 97% 97% 95% 96%    Labs:   Lab Results  Component Value Date   WBC 6.3 12/05/2014   HGB 15.0 12/05/2014   HCT 43.6 12/05/2014   MCV 87.2 12/05/2014   PLT 409* 12/05/2014    Recent Labs Lab 12/03/14 0741  12/04/14 2005 12/05/14 1930  NA 143  < > 142  --   K 3.1*  < > 4.0  --   CL 109  < > 107  --   CO2 27  < > 26  --   BUN 17  < > 11  --   CREATININE 0.99  < > 1.04 0.95  CALCIUM 8.9  < > 9.4  --   PROT 5.3*  --   --   --   BILITOT 0.5  --   --   --   ALKPHOS 37*  --   --   --   ALT 12  --   --   --   AST 11  --   --   --   GLUCOSE 114*  < > 116*  --   < > = values in this interval not displayed.   Discharge Medications:    Medication List    STOP taking these medications        metoprolol succinate 25 MG 24 hr  tablet  Commonly known as:  TOPROL-XL      TAKE these medications        albuterol (2.5 MG/3ML) 0.083% nebulizer solution  Commonly known as:  PROVENTIL  Take 2.5 mg by nebulization every 6 (six) hours as needed for wheezing.     allopurinol 300 MG tablet  Commonly known as:  ZYLOPRIM  Take 300 mg by mouth daily.     aspirin EC 325 MG tablet  Take 325 mg by mouth daily.     atorvastatin 20 MG tablet  Commonly known as:  LIPITOR  Take 1 tablet (20 mg total) by mouth daily.     beta carotene w/minerals tablet  Take 1 tablet by mouth daily.     clonazePAM 1 MG tablet  Commonly known as:  KLONOPIN  Take 1 mg by mouth at bedtime.     furosemide 20 MG tablet  Commonly known as:  LASIX  Take 20 mg by mouth as needed.     indomethacin 75 MG CR capsule  Commonly known as:  INDOCIN SR  Take 75 mg by mouth daily.     nitroGLYCERIN 0.4 MG/SPRAY spray  Commonly known as:  NITROLINGUAL  Place 1 spray under the tongue every 5 (five) minutes as needed for chest pain.     omeprazole 20 MG capsule  Commonly known as:  PRILOSEC  Take 20 mg by  mouth daily.     quinapril 40 MG tablet  Commonly known as:  ACCUPRIL  Take 40 mg by mouth daily.     SEROQUEL 300 MG tablet  Generic drug:  QUEtiapine  Take 300 mg by mouth at bedtime.     ZYRTEC ALLERGY 10 MG tablet  Generic drug:  cetirizine  Take 10 mg by mouth daily.        Disposition:  Discharge Instructions    Diet - low sodium heart healthy    Complete by:  As directed      Increase activity slowly    Complete by:  As directed           Follow-up Information    Follow up with Lewayne Bunting, MD On 12/28/2014.   Specialty:  Cardiology   Why:  at 10:45AM   Contact information:   [address]  [place] [address] [phone]       Duration of Discharge Encounter: Greater than 30 minutes including physician time.  Signed, Gypsy Balsam, NP 12/06/2014 11:37 AM   EP Attending  Patient seen and examined. Agree with above.   Leonia Reeves.D.

## 2014-12-15 ENCOUNTER — Ambulatory Visit: Payer: BLUE CROSS/BLUE SHIELD | Admitting: Cardiology

## 2014-12-16 ENCOUNTER — Ambulatory Visit (INDEPENDENT_AMBULATORY_CARE_PROVIDER_SITE_OTHER): Payer: BLUE CROSS/BLUE SHIELD | Admitting: Cardiology

## 2014-12-16 ENCOUNTER — Encounter: Payer: Self-pay | Admitting: Cardiology

## 2014-12-16 VITALS — BP 136/82 | HR 69 | Ht 69.0 in | Wt 193.8 lb

## 2014-12-16 DIAGNOSIS — I25810 Atherosclerosis of coronary artery bypass graft(s) without angina pectoris: Secondary | ICD-10-CM

## 2014-12-16 DIAGNOSIS — I441 Atrioventricular block, second degree: Secondary | ICD-10-CM

## 2014-12-16 NOTE — Patient Instructions (Signed)
Your physician recommends that you schedule a follow-up appointment in: 4 months. You will receive a reminder letter in the mail in about 2 months reminding you to call and schedule your appointment. If you don't receive this letter, please contact our office. Your physician recommends that you continue on your current medications as directed. Please refer to the Current Medication list given to you today. 

## 2014-12-16 NOTE — Progress Notes (Signed)
Cardiology Office Note   Date:  12/16/2014   ID:  [name] [name], DOB [birth year], MRN [id number]  PCP:  Sissy Hoff, MD  Cardiologist:  Willa Rough, MD   Chief Complaint  Patient presents with  . Appointment    Post hospital visit follow-up coronary disease and bradycardia      History of Present Illness: [name] [name] is a 59 y.o. male who presents today for follow-up of coronary disease and bradycardia with AV block. He was seen in the office and treated excellently by Dr. Diona Browner recently. He was sent to [facility]. I have reviewed all of those records. He underwent cardiac catheterization that showed that his grafts were patent. He had Mobitz type I AV block. His beta blocker was stopped. He feels much better. He will have follow-up with Dr. Ladona Ridgel also concerning this.  Past Medical History  Diagnosis Date  . Mixed hyperlipidemia   . Essential hypertension   . Asthma   . Gout   . ED (erectile dysfunction)   . Palpitations     Event recorder November 2010, no significant arrhythmias  . Bipolar 1 disorder   . Bradycardia     Very limited Wenkebach on event recorder November 2010  . 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. CAD (coronary artery disease) 2006    Multivessel status post CABG in [place], patent LIMA to LAD and SVG to diagonal with 40% stenosis 2010  . OSA (obstructive sleep apnea)     patient denies, stated had a sleep study and he was told he did not have      Past Surgical History  Procedure Laterality Date  . Back surgery      X4  . Coronary artery bypass graft  2006    [place]  . Left heart catheterization with coronary/graft angiogram N/A 12/05/2014    Procedure: LEFT HEART CATHETERIZATION WITH Isabel Caprice;  Surgeon: Peter M Swaziland, MD;  Location: [facility] CATH LAB;  Service: Cardiovascular;  Laterality: N/A;    Patient Active Problem List   Diagnosis Date Noted  . Chest pain with moderate risk of acute coronary syndrome     . CHB (complete heart block) 12/02/2014  . Memory loss-SVD on MRI March 2015 11/18/2013  . Obstructive sleep apnea   . Second degree AV block, Mobitz type I 03/10/2013  . 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. Dyslipidemia   . CAD- CABG x 2 2006. Patent grafts 2010   . HTN CVD- LVH, grade 1 DD, EF 60-65% by echo 12/03/14   . Asthma   . Palpitations   . Bipolar 1 disorder   . Hx of CABG   . Ejection fraction   . Bradycardia   . Tobacco abuse   . ALLERGIC RHINITIS 05/14/2010  . ASTHMA, UNSPECIFIED, UNSPECIFIED STATUS 07/05/2009      Current Outpatient Prescriptions  Medication Sig Dispense Refill  . albuterol (PROVENTIL) (2.5 MG/3ML) 0.083% nebulizer solution Take 2.5 mg by nebulization every 6 (six) hours as needed for wheezing.    [name] allopurinol (ZYLOPRIM) 300 MG tablet Take 300 mg by mouth daily.      [name] aspirin EC 325 MG tablet Take 325 mg by mouth daily.      . beta carotene w/minerals (OCUVITE) tablet Take 1 tablet by mouth daily.    . cetirizine (ZYRTEC ALLERGY) 10 MG tablet Take 10 mg by mouth daily.      . clonazePAM (KLONOPIN) 1 MG tablet Take 1 mg by mouth at bedtime.     [name]  furosemide (LASIX) 20 MG tablet Take 20 mg by mouth as needed.     . indomethacin (INDOCIN SR) 75 MG CR capsule Take 75 mg by mouth daily.     . nitroGLYCERIN (NITROLINGUAL) 0.4 MG/SPRAY spray Place 1 spray under the tongue every 5 (five) minutes as needed for chest pain. 4.9 g 3  . omeprazole (PRILOSEC) 20 MG capsule Take 20 mg by mouth daily.      . QUEtiapine (SEROQUEL) 300 MG tablet Take 300 mg by mouth at bedtime.      . quinapril (ACCUPRIL) 40 MG tablet Take 40 mg by mouth daily.    [name] atorvastatin (LIPITOR) 20 MG tablet Take 1 tablet (20 mg total) by mouth daily. (Patient not taking: Reported on 12/02/2014) 30 tablet 2   No current facility-administered medications for this visit.    Allergies:   Sulfonamide derivatives    Social History:  The patient  reports that he quit smoking about 29  years ago. His smoking use included Cigarettes. He has a 34 pack-year smoking history. His smokeless tobacco use includes Snuff. He reports that he does not drink alcohol or use illicit drugs.   Family History:  The patient's family history includes COPD in his mother; Heart attack in his father and mother; Heart disease in his father; Hyperlipidemia in his brother; Hypertension in his brother, brother, and sister; Lung cancer in his mother; Stroke in his mother.    ROS:  Please see the history of present illness.    Patient denies fever, chills, headache, sweats, rash, change in vision, change in hearing, chest pain, cough, nausea or vomiting, urinary symptoms. All other systems are reviewed and are negative.    PHYSICAL EXAM: VS:  BP 136/82 mmHg  Pulse 69  Ht  (1.753 m)  Wt 193 lb 12.8 oz (87.907 kg)  BMI 28.61 kg/m2  SpO2 97% , Patient is oriented to person time and place. Affect is normal. Head is atraumatic. Sclera and conjunctiva are normal. He is here with his wife. There is no jugular venous distention. Lungs are clear. Respiratory effort is nonlabored. Cardiac exam reveals S1 and S2. The abdomen is soft. There is no peripheral edema. There are no musculoskeletal deformities. There are no skin rashes. Neurologic is grossly intact.  EKG:   EKG is not done today.   Recent Labs: 12/02/2014: Magnesium 2.1; TSH 0.498 12/03/2014: ALT 12 12/04/2014: BUN 11; Potassium 4.0; Sodium 142 12/05/2014: Creatinine 0.95; Hemoglobin 15.0; Platelets 409*    Lipid Panel    Component Value Date/Time   CHOL 92 12/03/2014 0135   TRIG 65 12/03/2014 0135   HDL 33* 12/03/2014 0135   CHOLHDL 2.8 12/03/2014 0135   VLDL 13 12/03/2014 0135   LDLCALC 46 12/03/2014 0135      Wt Readings from Last 3 Encounters:  12/16/14 193 lb 12.8 oz (87.907 kg)  12/06/14 185 lb 11.2 oz (84.233 kg)  12/02/14 183 lb 6.4 oz (83.19 kg)      Current medicines are reviewed  The patient understands his  medications.     ASSESSMENT AND PLAN:

## 2014-12-16 NOTE — Assessment & Plan Note (Signed)
With recent hospitalization decision was made to stop beta blockade. This was done and he feels much better. He will follow-up with Dr. Ladona Ridgel over time to be sure that there is no need for pacemaker.

## 2014-12-16 NOTE — Assessment & Plan Note (Signed)
Recent catheterization revealed patent LIMA and a patent SVG to the diagonal. No further workup.

## 2014-12-28 ENCOUNTER — Ambulatory Visit: Payer: BLUE CROSS/BLUE SHIELD | Admitting: Internal Medicine

## 2015-02-20 ENCOUNTER — Telehealth: Payer: Self-pay

## 2015-02-20 NOTE — Telephone Encounter (Signed)
**Note De-Identified  Obfuscation** I have left messages on both the pts home and cell phones asking him to call me or the [place] office back.   Dr Myrtis Ser received disability paperwork to complete for this pt. Dr Myrtis Ser does not know why the pt is disabled and has asked me to contact the pt to find out what his disability is.  Once we talk with the pt we will forward message to Dr Myrtis Ser through [facility] for his review.

## 2015-02-20 NOTE — Telephone Encounter (Signed)
The pt returned my call. He states that the paperwork is for an FMLA for himself due to his hospitalization for slow HR on 12/02/14 when he was admitted into the hospital until 12/06/14.  Information given to Dr Myrtis Ser. Dr Myrtis Ser has filled out and signed the paperwork and I left it in medical records to return to the pt.

## 2015-02-21 ENCOUNTER — Telehealth: Payer: Self-pay | Admitting: Cardiology

## 2015-02-21 NOTE — Telephone Encounter (Signed)
LVM for patient FMLA ready for pick up.  °

## 2015-02-22 ENCOUNTER — Telehealth: Payer: Self-pay | Admitting: Cardiology

## 2015-02-22 NOTE — Telephone Encounter (Signed)
Pt left VM, Ok to [employer] to home address placed in mail today

## 2015-05-29 ENCOUNTER — Encounter: Payer: Self-pay | Admitting: Cardiology

## 2015-05-29 ENCOUNTER — Ambulatory Visit (INDEPENDENT_AMBULATORY_CARE_PROVIDER_SITE_OTHER): Payer: BLUE CROSS/BLUE SHIELD | Admitting: Cardiology

## 2015-05-29 VITALS — BP 130/94 | HR 74 | Ht 67.0 in | Wt 192.6 lb

## 2015-05-29 DIAGNOSIS — R001 Bradycardia, unspecified: Secondary | ICD-10-CM | POA: Diagnosis not present

## 2015-05-29 DIAGNOSIS — I25810 Atherosclerosis of coronary artery bypass graft(s) without angina pectoris: Secondary | ICD-10-CM

## 2015-05-29 NOTE — Progress Notes (Signed)
Cardiology Office Note   Date:  05/29/2015   ID:  [name] [name], DOB July 27, 1956, MRN [id number]  PCP:  Sissy Hoff, MD  Cardiologist:  Willa Rough, MD   Chief Complaint  Patient presents with  . Appointment    Follow-up posthospitalization in [place]      History of Present Illness: [name] [name] is a 59 y.o. male who presents today to follow-up coronary disease and hospitalization in [place], May 16, 2015. I saw him last in the office in April, 2016. At that time I outlined that just before that visit he underwent cardiac catheterization. His grafts were patent. He had Mobitz type I AV block. His beta blocker was stopped. Plans were being made for him to follow-up with Dr. Ladona Ridgel concerning his rhythm. The patient never followed through with that appointment.  Recently he was admitted with chest discomfort to [facility]. I have some of the labs listed from his hospitalization but I do not have complete discharge summary or consultation notes that were done. We will try to obtain this. He says that he did not have any type of exercise testing. He notes that his heartbeat was very slow and he was very worried about this. He has undergone monitoring in the past without proof of high degree AV block.    Past Medical History  Diagnosis Date  . Mixed hyperlipidemia   . Essential hypertension   . Asthma   . Gout   . ED (erectile dysfunction)   . Palpitations     Event recorder November 2010, no significant arrhythmias  . Bipolar 1 disorder   . Bradycardia     Very limited Wenkebach on event recorder November 2010  . 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. CAD (coronary artery disease) 2006    Multivessel status post CABG in [place], patent LIMA to LAD and SVG to diagonal with 40% stenosis 2010  . OSA (obstructive sleep apnea)     patient denies, stated had a sleep study and he was told he did not have      Past Surgical History    Procedure Laterality Date  . Back surgery      X4  . Coronary artery bypass graft  2006    [place]  . Left heart catheterization with coronary/graft angiogram N/A 12/05/2014    Procedure: LEFT HEART CATHETERIZATION WITH Isabel Caprice;  Surgeon: Peter M Swaziland, MD;  Location: [facility] CATH LAB;  Service: Cardiovascular;  Laterality: N/A;    Patient Active Problem List   Diagnosis Date Noted  . Chest pain with moderate risk of acute coronary syndrome   . CHB (complete heart block) 12/02/2014  . Memory loss-SVD on MRI March 2015 11/18/2013  . Obstructive sleep apnea   . Second degree AV block, Mobitz type I 03/10/2013  . 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. Dyslipidemia   . CAD (coronary artery disease) of artery bypass graft   . HTN CVD- LVH, grade 1 DD, EF 60-65% by echo 12/03/14   . Asthma   . Palpitations   . Bipolar 1 disorder   . Hx of CABG   . Ejection fraction   . Bradycardia   . Tobacco abuse   . ALLERGIC RHINITIS 05/14/2010  . ASTHMA, UNSPECIFIED, UNSPECIFIED STATUS 07/05/2009      Current Outpatient Prescriptions  Medication Sig Dispense Refill  . allopurinol (ZYLOPRIM) 300 MG tablet Take 300 mg by mouth daily.      [name] aspirin EC 325 MG tablet Take  325 mg by mouth daily.      . beta carotene w/minerals (OCUVITE) tablet Take 1 tablet by mouth daily.    . cetirizine (ZYRTEC ALLERGY) 10 MG tablet Take 10 mg by mouth daily.      . clonazePAM (KLONOPIN) 1 MG tablet Take 1 mg by mouth at bedtime.     . furosemide (LASIX) 20 MG tablet Take 20 mg by mouth daily as needed for edema.    . indomethacin (INDOCIN SR) 75 MG CR capsule Take 75 mg by mouth daily.     . nitroGLYCERIN (NITROLINGUAL) 0.4 MG/SPRAY spray Place 1 spray under the tongue every 5 (five) minutes as needed for chest pain. 4.9 g 3  . omeprazole (PRILOSEC) 20 MG capsule Take 20 mg by mouth daily.      . QUEtiapine (SEROQUEL) 300 MG tablet Take 300 mg by mouth at bedtime.      . quinapril (ACCUPRIL) 40 MG  tablet Take 40 mg by mouth daily.     No current facility-administered medications for this visit.    Allergies:   Sulfonamide derivatives    Social History:  The patient  reports that he quit smoking about 29 years ago. His smoking use included Cigarettes. He has a 34 pack-year smoking history. His smokeless tobacco use includes Snuff. He reports that he does not drink alcohol or use illicit drugs.   Family History:  The patient's family history includes COPD in his mother; Heart attack in his father and mother; Heart disease in his father; Hyperlipidemia in his brother; Hypertension in his brother, brother, and sister; Lung cancer in his mother; Stroke in his mother.    ROS:  Please see the history of present illness.     Patient denies fever, chills, headache, sweats, rash, change in vision, change in hearing, chest pain, cough, nausea or vomiting, urinary symptoms. All other systems are reviewed and are negative.   PHYSICAL EXAM: VS:  BP 130/94 mmHg  Pulse 74  Ht  (1.702 m)  Wt 192 lb 9.6 oz (87.363 kg)  BMI 30.16 kg/m2  SpO2 95% , Patient is stable today. He is oriented to person time and place. Affect is normal. Head is atraumatic. Sclera and conjunctiva are normal. There is no jugulovenous distention. Lungs are clear. Respiratory effort is unlabored. Cardiac exam reveals S1 and S2. The abdomen is soft. There is no peripheral edema. There are no musculoskeletal deformities. There are no skin rashes.   EKG:   EKG is not done today. Unfortunately we do not have any EKGs from his recent hospitalization.   Recent Labs: 12/02/2014: Magnesium 2.1; TSH 0.498 12/03/2014: ALT 12 12/04/2014: BUN 11; Potassium 4.0; Sodium 142 12/05/2014: Creatinine, Ser 0.95; Hemoglobin 15.0; Platelets 409*    Lipid Panel    Component Value Date/Time   CHOL 92 12/03/2014 0135   TRIG 65 12/03/2014 0135   HDL 33* 12/03/2014 0135   CHOLHDL 2.8 12/03/2014 0135   VLDL 13 12/03/2014 0135   LDLCALC  46 12/03/2014 0135      Wt Readings from Last 3 Encounters:  05/29/15 192 lb 9.6 oz (87.363 kg)  12/16/14 193 lb 12.8 oz (87.907 kg)  12/06/14 185 lb 11.2 oz (84.233 kg)      Current medicines are reviewed  The patient understands his medications.     ASSESSMENT AND PLAN:

## 2015-05-29 NOTE — Patient Instructions (Signed)
Medication Instructions:  No changes today  Labwork: None today  Testing/Procedures: None today  Follow-Up: You have been referred to Dr Aline Brochure patient evaluate bradycardia/ LINQ placement-in [place] Office.  Your physician wants you to follow-up in: 6 months in the [facility]. You will receive a reminder letter in the mail two months in advance. If you don't receive a letter, please call our office to schedule the follow-up appointment.

## 2015-05-29 NOTE — Assessment & Plan Note (Signed)
The patient underwent cardiac catheterization March, 2016. Patent LIMA to the LAD and patent SVG to the diagonal were present. He had some recent chest pain. I've chosen not to change his medications. We will try to obtain information from the hospital in [place].

## 2015-05-29 NOTE — Assessment & Plan Note (Signed)
The patient has had some limited Wenkebach over time. He is insistent that he has marked bradycardia and needs a pacemaker. I do not have any of the records from his recent hospitalization in [place]. I'm arranging for him to see Dr. Ladona Ridgel for complete evaluation. It is possible that a LINQ implantable recorder will be necessary. Patient says that he needs something done. I explained that we cannot yet recommend a pacemaker based on the information available to [place] at this time.

## 2015-06-07 ENCOUNTER — Encounter: Payer: Self-pay | Admitting: Cardiology

## 2015-06-07 NOTE — Progress Notes (Signed)
I have received records from the patient's hospitalization at [facility]. He was admitted on May 15, 2015. He was discharged on May 20, 2015. Myocardial infarction was ruled out. Troponins were negative. There was mention of first-degree AV block. There is no mention of high-grade bradycardia arrhythmias. No significant testing was done during the hospitalization. The plan was for him to have outpatient follow-up with our cardiology team which hasn't been done.

## 2015-07-19 ENCOUNTER — Ambulatory Visit (INDEPENDENT_AMBULATORY_CARE_PROVIDER_SITE_OTHER): Payer: BLUE CROSS/BLUE SHIELD | Admitting: Internal Medicine

## 2015-07-19 ENCOUNTER — Encounter: Payer: Self-pay | Admitting: Internal Medicine

## 2015-07-19 VITALS — BP 116/72 | HR 67 | Ht 67.0 in | Wt 195.0 lb

## 2015-07-19 DIAGNOSIS — Z720 Tobacco use: Secondary | ICD-10-CM | POA: Diagnosis not present

## 2015-07-19 DIAGNOSIS — I442 Atrioventricular block, complete: Secondary | ICD-10-CM

## 2015-07-19 DIAGNOSIS — I110 Hypertensive heart disease with heart failure: Secondary | ICD-10-CM | POA: Diagnosis not present

## 2015-07-19 DIAGNOSIS — I441 Atrioventricular block, second degree: Secondary | ICD-10-CM

## 2015-07-19 NOTE — Patient Instructions (Signed)
Your physician recommends that you continue on your current medications as directed. Please refer to the Current Medication list given to you today.  Your physician has requested that you have an exercise tolerance test. For further information please visit [email or web address]. Please also follow instruction sheet, as given.   

## 2015-07-19 NOTE — Assessment & Plan Note (Signed)
His current blood pressure is well controlled. He will continue his current medications.

## 2015-07-19 NOTE — Assessment & Plan Note (Signed)
The real question is whether the patient's symptoms correlate with his bradycardia. At times they certainly have. At other times they certainly have not. It does appear that the patient is having worsening bradycardia. I've recommended that he undergo exercise treadmill testing to assess his chronotropic response to exercise. If he has a poor response, permanent pacemaker insertion would be recommended. I might consider a Biotronik pacemaker if this is the case for improved rate response.

## 2015-07-19 NOTE — Assessment & Plan Note (Signed)
He has a history of tobacco abuse and is encouraged not to smoke.

## 2015-07-19 NOTE — Progress Notes (Signed)
HPI [name] [name] is referred today by Dr. Myrtis Ser to consider permanent pacemaker insertion. He is a 59 year old man with known coronary artery disease, status post bypass surgery, hypertension, and gout. The patient has had bradycardia in the past. In 2014, he underwent treadmill testing demonstrating an appropriate heart rate response to exercise, with his AV conduction improving and sinus rate increasing to over 115 bpm. Over the last several months, the patient has had intermittent episodes of fatigue and weakness, and has been noted to have heart rates in the 30s. Review of his ECGs and telemetry strips during episodes demonstrated 2-1 heart block. He has never had frank syncope. The patient returns today for additional valuation. He denies chest pain. He has dyspnea with exertion. Allergies  Allergen Reactions  . Sulfonamide Derivatives Hives     Current Outpatient Prescriptions  Medication Sig Dispense Refill  . allopurinol (ZYLOPRIM) 300 MG tablet Take 300 mg by mouth daily.      [name] aspirin EC 325 MG tablet Take 325 mg by mouth daily.      . beta carotene w/minerals (OCUVITE) tablet Take 1 tablet by mouth daily.    . cetirizine (ZYRTEC ALLERGY) 10 MG tablet Take 10 mg by mouth daily.      . clonazePAM (KLONOPIN) 1 MG tablet Take 1 mg by mouth at bedtime.     . furosemide (LASIX) 20 MG tablet Take 20 mg by mouth daily as needed for edema.    . indomethacin (INDOCIN SR) 75 MG CR capsule Take 75 mg by mouth daily.     . nitroGLYCERIN (NITROLINGUAL) 0.4 MG/SPRAY spray Place 1 spray under the tongue every 5 (five) minutes as needed for chest pain. 4.9 g 3  . omeprazole (PRILOSEC) 20 MG capsule Take 20 mg by mouth daily.      . QUEtiapine (SEROQUEL) 300 MG tablet Take 300 mg by mouth at bedtime.      . quinapril (ACCUPRIL) 40 MG tablet Take 40 mg by mouth daily.    [name] COLCRYS 0.6 MG tablet   0   No current facility-administered medications for this visit.     Past Medical History    Diagnosis Date  . Mixed hyperlipidemia   . Essential hypertension   . Asthma   . Gout   . ED (erectile dysfunction)   . Palpitations     Event recorder November 2010, no significant arrhythmias  . Bipolar 1 disorder (HCC)   . Bradycardia     Very limited Wenkebach on event recorder November 2010  . 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(HCC)   . CAD (coronary artery disease) 2006    Multivessel status post CABG in [place], patent LIMA to LAD and SVG to diagonal with 40% stenosis 2010  . OSA (obstructive sleep apnea)     patient denies, stated had a sleep study and he was told he did not have      ROS:   All systems reviewed and negative except as noted in the HPI.   Past Surgical History  Procedure Laterality Date  . Back surgery      X4  . Coronary artery bypass graft  2006    [place]  . Left heart catheterization with coronary/graft angiogram N/A 12/05/2014    Procedure: LEFT HEART CATHETERIZATION WITH Isabel Caprice;  Surgeon: Peter M Swaziland, MD;  Location: [facility] CATH LAB;  Service: Cardiovascular;  Laterality: N/A;     Family History  Problem Relation Age of Onset  . COPD Mother   .  Lung cancer Mother   . Stroke Mother   . Heart attack Mother   . Heart disease Father   . Heart attack Father   . Hyperlipidemia Brother   . Hypertension Brother   . Hypertension Brother   . Hypertension Sister      Social History   Social History  . Marital Status: Married    Spouse Name: N/A  . Number of Children: N/A  . Years of Education: N/A   Occupational History  . Not on file.   Social History Main Topics  . Smoking status: Former Smoker -- 2.00 packs/day for 17 years    Types: Cigarettes    Quit date: 09/16/1985  . Smokeless tobacco: Current User    Types: Snuff  . Alcohol Use: No  . Drug Use: No  . Sexual Activity: Yes    Birth Control/ Protection: None   Other Topics Concern  . 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 Does not regularly exercise.      BP 116/72 mmHg  Pulse 67  Ht 5\' 7"  (1.702 m)  Wt 195 lb (88.451 kg)  BMI 30.53 kg/m2  Physical Exam:  Well appearing 59 year old man, NAD HEENT: Unremarkable Neck:  6 cm JVD, no thyromegally Lymphatics:  No adenopathy Back:  No CVA tenderness Lungs:  Clear, with no wheezes, rales, or rhonchi. HEART:  Regular rate rhythm, no murmurs, no rubs, no clicks Abd:  soft, positive bowel sounds, no organomegally, no rebound, no guarding Ext:  2 plus pulses, no edema, no cyanosis, no clubbing Skin:  No rashes no nodules Neuro:  CN II through XII intact, motor grossly intact  EKG - from August demonstrates sinus rhythm with 2:1 heart block  Assess/Plan:

## 2015-08-07 ENCOUNTER — Other Ambulatory Visit: Payer: Self-pay | Admitting: Internal Medicine

## 2015-08-07 DIAGNOSIS — I441 Atrioventricular block, second degree: Secondary | ICD-10-CM

## 2015-08-07 DIAGNOSIS — I25119 Atherosclerotic heart disease of native coronary artery with unspecified angina pectoris: Secondary | ICD-10-CM

## 2015-08-07 DIAGNOSIS — R001 Bradycardia, unspecified: Secondary | ICD-10-CM

## 2015-08-08 ENCOUNTER — Encounter: Payer: Self-pay | Admitting: Physician Assistant

## 2015-08-08 ENCOUNTER — Ambulatory Visit (INDEPENDENT_AMBULATORY_CARE_PROVIDER_SITE_OTHER): Payer: BLUE CROSS/BLUE SHIELD | Admitting: Physician Assistant

## 2015-08-08 ENCOUNTER — Telehealth: Payer: Self-pay | Admitting: Physician Assistant

## 2015-08-08 ENCOUNTER — Ambulatory Visit: Payer: BLUE CROSS/BLUE SHIELD

## 2015-08-08 VITALS — Ht 67.0 in | Wt 196.2 lb

## 2015-08-08 DIAGNOSIS — I442 Atrioventricular block, complete: Secondary | ICD-10-CM | POA: Diagnosis not present

## 2015-08-08 DIAGNOSIS — I25119 Atherosclerotic heart disease of native coronary artery with unspecified angina pectoris: Secondary | ICD-10-CM

## 2015-08-08 DIAGNOSIS — I495 Sick sinus syndrome: Secondary | ICD-10-CM | POA: Diagnosis not present

## 2015-08-08 DIAGNOSIS — R001 Bradycardia, unspecified: Secondary | ICD-10-CM

## 2015-08-08 DIAGNOSIS — I441 Atrioventricular block, second degree: Secondary | ICD-10-CM

## 2015-08-08 LAB — CBC
HEMATOCRIT: 45.3 % (ref 39.0–52.0)
HEMOGLOBIN: 15.9 g/dL (ref 13.0–17.0)
MCH: 30.3 pg (ref 26.0–34.0)
MCHC: 35.1 g/dL (ref 30.0–36.0)
MCV: 86.5 fL (ref 78.0–100.0)
MPV: 11.2 fL (ref 8.6–12.4)
Platelets: 443 10*3/uL — ABNORMAL HIGH (ref 150–400)
RBC: 5.24 MIL/uL (ref 4.22–5.81)
RDW: 14.2 % (ref 11.5–15.5)
WBC: 6.8 10*3/uL (ref 4.0–10.5)

## 2015-08-08 LAB — BASIC METABOLIC PANEL
BUN: 13 mg/dL (ref 7–25)
CHLORIDE: 107 mmol/L (ref 98–110)
CO2: 28 mmol/L (ref 20–31)
Calcium: 9.1 mg/dL (ref 8.6–10.3)
Creat: 0.8 mg/dL (ref 0.70–1.33)
Glucose, Bld: 87 mg/dL (ref 65–99)
POTASSIUM: 4.1 mmol/L (ref 3.5–5.3)
SODIUM: 143 mmol/L (ref 135–146)

## 2015-08-08 LAB — EXERCISE TOLERANCE TEST
CHL CUP MPHR: 163 {beats}/min
CHL CUP RESTING HR STRESS: 87 {beats}/min

## 2015-08-08 MED ORDER — APIXABAN 5 MG PO TABS: 5.0000 mg | ORAL_TABLET | Freq: Two times a day (BID) | ORAL | Status: DC

## 2015-08-08 NOTE — Telephone Encounter (Signed)
NewMessage  Pt wife stated she is returning [place] phone call. Please call back and discuss.

## 2015-08-08 NOTE — Patient Instructions (Signed)
Medication Instructions:   STOP ASPIRIN  START ELIQUIS 5MG  TWICE A DAY    If you need a refill on your cardiac medications before your next appointment, please call your pharmacy.  Labwork:  BMET AND CBC    Testing/Procedures:  NONE ORDER TODAY    Follow-Up: 3 TO 4 WEEKS WITH DR Ladona Ridgel    Any Other Special Instructions Will Be Listed Below (If Applicable).

## 2015-08-08 NOTE — Progress Notes (Signed)
Cardiology Office Note Date:  08/08/2015  Patient ID:  Christy, Friede 1956/02/16, MRN [id number] PCP:  Sissy Hoff, MD  Cardiologist:  Dr. Myrtis Ser Electrophysiologist: Dr. Ladona Ridgel   Chief Complaint: Thwe patient is ferred today for an OV from Ronie Spies, he was scheduled to have an ETT today but arrived in AFlutter, given this is new for him, the test was cancelled for an OV.  History of Present Illness: ANGELDEJESUS CALLAHAM is a 59 y.o. male with history of CAD, HTN, hyperlipidemia, bradycardia, heart block with 2:1 conduction.  For his heart block he was recently seen by Dr. Ladona Ridgel and sent for the ETT to evaluate his chronotropic response to exercise.  In the recent months records state he has been c/o episodes of fatigue and weakness and HR in the 30's at times.  Earlier this month with his visit with Dr. Ladona Ridgel, he mentioned that thel question is whether the patient's symptoms correlated with his bradycardia. At times it was felt they certainly have. At other times they certainly have not. But did not appear that he was having worsening bradycardia. Decision regarding PPM ornot, was pending his ETT findings.  Today he comes feeling well, he does not perceive any palpitations or irregularity to his heart beat.  For the past year or two though he reports fatigue, some days feels great, others he feels like her could sleep for 12 hours.  No CP, no near syncope or syncope, but sometimes he has felt lightheaded.  He has no hematological history, no history of bleeding issues, he has never been on a/c in the past  Past Medical History  Diagnosis Date  . Mixed hyperlipidemia   . Essential hypertension   . Asthma   . Gout   . ED (erectile dysfunction)   . Palpitations     Event recorder November 2010, no significant arrhythmias  . Bipolar 1 disorder (HCC)   . Bradycardia     Very limited Wenkebach on event recorder November 2010  . 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(HCC)   . CAD  (coronary artery disease) 2006    Multivessel status post CABG in [place], patent LIMA to LAD and SVG to diagonal with 40% stenosis 2010  . OSA (obstructive sleep apnea)     patient denies, stated had a sleep study and he was told he did not have      Past Surgical History  Procedure Laterality Date  . Back surgery      X4  . Coronary artery bypass graft  2006    [place]  . Left heart catheterization with coronary/graft angiogram N/A 12/05/2014    Procedure: LEFT HEART CATHETERIZATION WITH Isabel Caprice;  Surgeon: Peter M Swaziland, MD;  Location: [facility] CATH LAB;  Service: Cardiovascular;  Laterality: N/A;    Current Outpatient Prescriptions  Medication Sig Dispense Refill  . allopurinol (ZYLOPRIM) 300 MG tablet Take 300 mg by mouth daily.      [name] aspirin EC 325 MG tablet Take 325 mg by mouth daily.      . beta carotene w/minerals (OCUVITE) tablet Take 1 tablet by mouth daily.    . cetirizine (ZYRTEC ALLERGY) 10 MG tablet Take 10 mg by mouth daily.      . clonazePAM (KLONOPIN) 1 MG tablet Take 1 mg by mouth at bedtime.     [name] COLCRYS 0.6 MG tablet Take 0.6 mg by mouth daily.   0  . furosemide (LASIX) 20 MG tablet Take 20 mg by mouth daily as  needed for edema.    . indomethacin (INDOCIN SR) 75 MG CR capsule Take 75 mg by mouth daily.     . nitroGLYCERIN (NITROLINGUAL) 0.4 MG/SPRAY spray Place 1 spray under the tongue every 5 (five) minutes as needed for chest pain. 4.9 g 3  . omeprazole (PRILOSEC) 20 MG capsule Take 20 mg by mouth daily.      . QUEtiapine (SEROQUEL) 300 MG tablet Take 300 mg by mouth at bedtime.      . quinapril (ACCUPRIL) 40 MG tablet Take 40 mg by mouth daily.    [name] apixaban (ELIQUIS) 5 MG TABS tablet Take 1 tablet (5 mg total) by mouth 2 (two) times daily. 60 tablet 5   No current facility-administered medications for this visit.    Allergies:   Sulfonamide derivatives   Social History:  The patient  reports that he quit smoking about 29 years ago. His  smoking use included Cigarettes. He has a 34 pack-year smoking history. His smokeless tobacco use includes Snuff. He reports that he does not drink alcohol or use illicit drugs.   Family History:  The patient's family history includes COPD in his mother; Heart attack in his father and mother; Heart disease in his father; Hyperlipidemia in his brother; Hypertension in his brother, brother, and sister; Lung cancer in his mother; Stroke in his mother.  ROS:  Please see the history of present illness.  All other systems are reviewed and otherwise negative.   PHYSICAL EXAM:  VS:  Ht 5\' 7"  (1.702 m)  Wt 196 lb 3.2 oz (88.996 kg)  BMI 30.72 kg/m2 BMI: Body mass index is 30.72 kg/(m^2).  122/80, 82bpm, 97% O2 sat on RA  Well nourished, well developed, in no acute distress HEENT: normocephalic, atraumatic Neck: no JVD, carotid bruits or masses Cardiac:  normal S1, S2; RRR at this time; no significant murmurs, no rubs, or gallops Lungs:  clear to auscultation bilaterally, no wheezing, rhonchi or rales Abd: soft, nontender,  + BS MS: no deformity or atrophy Ext: no edema Skin: warm and dry, no rash Neuro:  No gross deficits appreciated Psych: euthymic mood, full affect  EKG:  Done today shows AFib/flutter, his rhythm strips also with transient SR, his last rhythm prior to taking him off the leadds was SR, 1st degree AVblock, SR EKG appears SR 1st degree AVblock, ICRBBB August EKG is SR 2:1 heart block  Recent Labs: 12/02/2014: Magnesium 2.1; TSH 0.498 12/03/2014: ALT 12 12/04/2014: BUN 11; Potassium 4.0; Sodium 142 12/05/2014: Creatinine, Ser 0.95; Hemoglobin 15.0; Platelets 409*  12/03/2014: Cholesterol 92; HDL 33*; LDL Cholesterol 46; Total CHOL/HDL Ratio 2.8; Triglycerides 65; VLDL 13   CrCl cannot be calculated (Patient has no serum creatinine result on file.).   Wt Readings from Last 3 Encounters:  08/08/15 196 lb 3.2 oz (88.996 kg)  07/19/15 195 lb (88.451 kg)  05/29/15 192 lb 9.6 oz  (87.363 kg)    12/05/14: [facility] Final Conclusions:  1. Single vessel occlusive CAD 2. Patent LIMA to the LAD 3. Patent SVG to the diagonal 4. Normal LV function.  Recommendations: continue medical management.   12/03/14: Echocardiogram Study Conclusions - Left ventricle: The cavity size was normal. There was mild concentric hypertrophy. Systolic function was normal. The estimated ejection fraction was in the range of 60% to 65%. Wall motion was normal; there were no regional wall motion abnormalities. Doppler parameters are consistent with abnormal left ventricular relaxation (grade 1 diastolic dysfunction). There was no evidence of elevated ventricular filling  pressure by Doppler parameters. - Aortic valve: Trileaflet; normal thickness leaflets. There was no regurgitation. - Aortic root: The aortic root was normal in size. - Mitral valve: Structurally normal valve. There was no regurgitation. - Left atrium: The atrium was mildly dilated. - Right ventricle: Systolic function was normal. - Right atrium: The atrium was normal in size. - Tricuspid valve: There was trivial regurgitation. - Pulmonary arteries: Systolic pressure was within the normal range. - Inferior vena cava: The vessel was normal in size. - Pericardium, extracardiac: There was no pericardial effusion.  04/10/15: Sleep study IMPRESSIONS-RECOMMENDATIONS: 1. Very mild obstructive sleep apnea/hypopnea syndrome, with an AHI of  7 events per hour and oxygen desaturation transiently as low as  87%. Treatment for this degree of sleep apnea can include a trial  of weight loss alone, upper airway surgery, dental appliance, and  also CPAP. Clinical correlation is suggested. The decision to  treat this degree of sleep apnea aggressively should be dependent  upon its impact to the patient's quality of life. It is typically  not associated with significant increased cardiovascular  events. 2. Sinus bradycardia noted throughout the night, along with rare PVC  and occasional episodes of second-degree AV block of the Wenckebach  variety.  Other studies reviewed: Additional studies/records reviewed today include: summarized above  ASSESSMENT AND PLAN:  1. New onset PAFib/flutter, unclear when, he appears asymptomatic from this today      Lengthy discussion about stroke risk, and reduction with full anticoagulation.  He understands and is agreeable.      D/w Dr. Elberta Fortis who also spoke with the patient      We will start Eliquis  PO BID, drew for BMET and CBC today      He is counseled to notify of any unusual bleeding or signs of bleeding we disussed today 2. Bradycardia     2:1 AVBlock      ETT was cancelled due to new rhythm for the patient 3. CAD     Stable by last cath, medical management     No anginal c/o 4. HTN     Appears well controlled  Disposition: F/u with Ladona Ridgel 3-4 weeks to discuss further POC  Current medicines are reviewed at length with the patient today.  The patient did not have any concerns regarding medicines.  Judith Blonder, PA-C 08/08/2015 4:37 PM     [facility] [address] [place] [address] [phone] (office)  [phone] (fax)

## 2015-08-08 NOTE — Progress Notes (Signed)
Patient presented for ETT today to assess for chronotropic incompetence given h/o bradycardia with HR in the 30s. See recent OV.  When he presented for treadmill test today the tech noted he was in and out of atrial flutter/fib on the monitor with variable HR 80s-90s. He was asymptomatic with this. D/w Dr. Elberta Fortis who recommended I speak with Dr. Ladona Ridgel. He was not available so I spoke with Gypsy Balsam EP-NP. She recommended for the EP office staff to see patient. Will need discussion about anticoagulation. Francis Dowse can see patient at 2:30pm, patient aware. Henretta Quist PA-C

## 2015-08-09 ENCOUNTER — Encounter: Payer: Self-pay | Admitting: Physician Assistant

## 2015-08-15 ENCOUNTER — Telehealth: Payer: Self-pay | Admitting: *Deleted

## 2015-08-15 NOTE — Telephone Encounter (Signed)
-----   Message from Renee Lynn Ursuy, PA-C sent at 08/09/2015  3:13 PM EST ----- Please let the patient know his labs look good, to keep his appointment with Dr. Taylor as planned.  Thanks 

## 2015-08-23 ENCOUNTER — Telehealth: Payer: Self-pay | Admitting: *Deleted

## 2015-08-23 NOTE — Telephone Encounter (Signed)
-----   Message from [facility] [place] sent at 08/09/2015  3:13 PM EST ----- Please let the patient know his labs look good, to keep his appointment with Dr. Ladona Ridgel as planned.  Thanks

## 2015-08-23 NOTE — Telephone Encounter (Signed)
-----   Message from Renee Lynn Ursuy, PA-C sent at 08/09/2015  3:13 PM EST ----- Please let the patient know his labs look good, to keep his appointment with Dr. Taylor as planned.  Thanks 

## 2015-09-05 ENCOUNTER — Encounter: Payer: Self-pay | Admitting: Internal Medicine

## 2015-09-05 ENCOUNTER — Ambulatory Visit (INDEPENDENT_AMBULATORY_CARE_PROVIDER_SITE_OTHER): Payer: BLUE CROSS/BLUE SHIELD | Admitting: Internal Medicine

## 2015-09-05 VITALS — BP 128/78 | HR 79 | Ht 67.0 in | Wt 198.0 lb

## 2015-09-05 DIAGNOSIS — I25708 Atherosclerosis of coronary artery bypass graft(s), unspecified, with other forms of angina pectoris: Secondary | ICD-10-CM | POA: Diagnosis not present

## 2015-09-05 DIAGNOSIS — I495 Sick sinus syndrome: Secondary | ICD-10-CM

## 2015-09-05 DIAGNOSIS — I442 Atrioventricular block, complete: Secondary | ICD-10-CM | POA: Diagnosis not present

## 2015-09-05 LAB — BASIC METABOLIC PANEL WITH GFR
BUN: 13 mg/dL (ref 7–25)
CO2: 22 mmol/L (ref 20–31)
Calcium: 9 mg/dL (ref 8.6–10.3)
Chloride: 108 mmol/L (ref 98–110)
Creat: 0.92 mg/dL (ref 0.70–1.33)
Glucose, Bld: 114 mg/dL — ABNORMAL HIGH (ref 65–99)
Potassium: 4.4 mmol/L (ref 3.5–5.3)
Sodium: 142 mmol/L (ref 135–146)

## 2015-09-05 NOTE — Assessment & Plan Note (Signed)
He has had intermittent CHB. I have discussed the indications for PPM insertion with the patient and his wife and he wishes to proceed.

## 2015-09-05 NOTE — Assessment & Plan Note (Signed)
He remains symptomatic despite stopping all of his meds. He has also had atrial fibrillation. He will certainly need treatment of this down the road. I have discussed the treatment options with the patient and he wishes to proceed. Will schedule as soon as our schedule allows.

## 2015-09-05 NOTE — Patient Instructions (Addendum)
Medication Instructions:  Your physician recommends that you continue on your current medications as directed. Please refer to the Current Medication list given to you today.   Labwork: Your physician recommends that you return for lab work today: BMP/CBC   Testing/Procedures:  Your physician has recommended that you have a pacemaker inserted. A pacemaker is a small device that is placed under the skin of your chest or abdomen to help control abnormal heart rhythms. This device uses electrical pulses to prompt the heart to beat at a normal rate. Pacemakers are used to treat heart rhythms that are too slow. Wire (leads) are attached to the pacemaker that goes into the chambers of you heart. This is done in the hospital and usually requires and overnight stay. Please see the instruction sheet given to you today for more information.  Please check in at the [facility] Entrance of [facility] at 10:00am on 09/12/15.  Okay to have breakfast but nothing to eat or drink after 6am  Hold Eliquis for 2 days prior to the procedure     Follow-Up: Your physician recommends that you schedule a follow-up appointment in: 10-14 days from 09/12/15 in device clinic for wound check and 3 months from 09/12/15 with Dr Ladona Ridgel   Any Other Special Instructions Will Be Listed Below (If Applicable).     If you need a refill on your cardiac medications before your next appointment, please call your pharmacy.

## 2015-09-05 NOTE — Assessment & Plan Note (Signed)
He denies anginal symptoms. Will follow. 

## 2015-09-05 NOTE — Progress Notes (Signed)
HPI [name] [name] returns today to consider permanent pacemaker insertion. He is a 59 year old man with known coronary artery disease, status post bypass surgery, hypertension, and gout. The patient has had bradycardia in the past. In 2014, he underwent treadmill testing demonstrating an appropriate heart rate response to exercise, with his AV conduction improving and sinus rate increasing to over 115 bpm. Over the last several months, the patient has had intermittent episodes of fatigue and weakness, and has been noted to have heart rates in the 30s. Review of his ECGs and telemetry strips during episodes demonstrated 2-1 heart block. He has never had frank syncope. He was seen for a stress test several weeks ago and the test was cancelled. He does not have palpitations but he does have fatigue and weakness and I suspect he is out of rhythm more than he knows. He was also seen in the [facility] ER in the past few weeks with more symptomatic bradycardia.  Allergies  Allergen Reactions  . Sulfonamide Derivatives Hives     Current Outpatient Prescriptions  Medication Sig Dispense Refill  . allopurinol (ZYLOPRIM) 300 MG tablet Take 300 mg by mouth daily.      [name] apixaban (ELIQUIS) 5 MG TABS tablet Take 1 tablet (5 mg total) by mouth 2 (two) times daily. 60 tablet 5  . beta carotene w/minerals (OCUVITE) tablet Take 1 tablet by mouth daily.    . cetirizine (ZYRTEC ALLERGY) 10 MG tablet Take 10 mg by mouth daily.      . clonazePAM (KLONOPIN) 1 MG tablet Take 1 mg by mouth at bedtime.     [name] COLCRYS 0.6 MG tablet Take 0.6 mg by mouth daily.   0  . furosemide (LASIX) 20 MG tablet Take 20 mg by mouth daily as needed for edema.    . indomethacin (INDOCIN SR) 75 MG CR capsule Take 75 mg by mouth daily.     . nitroGLYCERIN (NITROLINGUAL) 0.4 MG/SPRAY spray Place 1 spray under the tongue every 5 (five) minutes as needed for chest pain. 4.9 g 3  . omeprazole (PRILOSEC) 20 MG capsule Take 20 mg by mouth  daily.      . QUEtiapine (SEROQUEL) 300 MG tablet Take 300 mg by mouth at bedtime.      . quinapril (ACCUPRIL) 40 MG tablet Take 40 mg by mouth daily.     No current facility-administered medications for this visit.     Past Medical History  Diagnosis Date  . Mixed hyperlipidemia   . Essential hypertension   . Asthma   . Gout   . ED (erectile dysfunction)   . Bipolar 1 disorder (HCC)   . Bradycardia     Very limited Wenkebach on event recorder November 2010  . 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(HCC)   . CAD (coronary artery disease) 2006    Multivessel status post CABG in [place], patent LIMA to LAD and SVG to diagonal with 40% stenosis 2010  . OSA (obstructive sleep apnea)     patient denies, stated had a sleep study and he was told he did not have    . Atrial fibrillation [facility])     new November 2016    ROS:   All systems reviewed and negative except as noted in the HPI.   Past Surgical History  Procedure Laterality Date  . Back surgery      X4  . Coronary artery bypass graft  2006    [place]  . Left heart catheterization with coronary/graft  angiogram N/A 12/05/2014    Procedure: LEFT HEART CATHETERIZATION WITH Isabel Caprice;  Surgeon: [name] M Swaziland, MD;  Location: [facility] CATH LAB;  Service: Cardiovascular;  Laterality: N/A;     Family History  Problem Relation Age of Onset  . COPD Mother   . Lung cancer Mother   . Stroke Mother   . Heart attack Mother   . Heart disease Father   . Heart attack Father   . Hyperlipidemia Brother   . Hypertension Brother   . Hypertension Brother   . Hypertension Sister      Social History   Social History  . Marital Status: Married    Spouse Name: N/A  . Number of Children: N/A  . Years of Education: N/A   Occupational History  . Not on file.   Social History Main Topics  . Smoking status: Former Smoker -- 2.00 packs/day for 17 years    Types: Cigarettes    Quit date: 09/16/1985  . Smokeless tobacco:  Current User    Types: Snuff     Comment: qyut chewing tobacco 3 months ago  . Alcohol Use: No     Comment: the patient has remote history ETOH and "pills" none of either in over 9 years  . Drug Use: No  . Sexual Activity: Yes    Birth Control/ Protection: None   Other Topics Concern  . 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      BP 128/78 mmHg  Pulse 79  Ht 5\' 7"  (1.702 m)  Wt 198 lb (89.812 kg)  BMI 31.00 kg/m2  Physical Exam:  Well appearing 59 year old man, NAD HEENT: Unremarkable Neck:  6 cm JVD, no thyromegally Lymphatics:  No adenopathy Back:  No CVA tenderness Lungs:  Clear, with no wheezes, rales, or rhonchi. HEART:  Regular rate rhythm, no murmurs, no rubs, no clicks Abd:  soft, positive bowel sounds, no organomegally, no rebound, no guarding Ext:  2 plus pulses, no edema, no cyanosis, no clubbing Skin:  No rashes no nodules Neuro:  CN II through XII intact, motor grossly intact  EKG - NSR with a long PR interval and iRBBB  Assess/Plan:

## 2015-09-06 LAB — CBC WITH DIFFERENTIAL/PLATELET
BASOS ABS: 0.1 10*3/uL (ref 0.0–0.1)
BASOS PCT: 2 % — AB (ref 0–1)
Eosinophils Absolute: 0.1 10*3/uL (ref 0.0–0.7)
Eosinophils Relative: 1 % (ref 0–5)
HCT: 45.2 % (ref 39.0–52.0)
HEMOGLOBIN: 15.7 g/dL (ref 13.0–17.0)
Lymphocytes Relative: 20 % (ref 12–46)
Lymphs Abs: 1.3 10*3/uL (ref 0.7–4.0)
MCH: 30.2 pg (ref 26.0–34.0)
MCHC: 34.7 g/dL (ref 30.0–36.0)
MCV: 86.9 fL (ref 78.0–100.0)
MONO ABS: 0.5 10*3/uL (ref 0.1–1.0)
MPV: 11.2 fL (ref 8.6–12.4)
Monocytes Relative: 7 % (ref 3–12)
NEUTROS ABS: 4.7 10*3/uL (ref 1.7–7.7)
Neutrophils Relative %: 70 % (ref 43–77)
Platelets: 408 10*3/uL — ABNORMAL HIGH (ref 150–400)
RBC: 5.2 MIL/uL (ref 4.22–5.81)
RDW: 14.7 % (ref 11.5–15.5)
WBC: 6.7 10*3/uL (ref 4.0–10.5)

## 2015-09-13 ENCOUNTER — Encounter (HOSPITAL_COMMUNITY)
Admission: RE | Disposition: A | Discharge status: Home / Self Care | Payer: Self-pay | Source: Ambulatory Visit | Attending: Internal Medicine

## 2015-09-13 ENCOUNTER — Encounter (HOSPITAL_COMMUNITY): Payer: Self-pay | Admitting: General Practice

## 2015-09-13 ENCOUNTER — Ambulatory Visit (HOSPITAL_COMMUNITY)
Admission: RE | Admit: 2015-09-13 | Discharge: 2015-09-14 | Disposition: A | Discharge status: Home / Self Care | Payer: BLUE CROSS/BLUE SHIELD | Source: Ambulatory Visit | Attending: Internal Medicine | Admitting: Internal Medicine

## 2015-09-13 DIAGNOSIS — I4891 Unspecified atrial fibrillation: Secondary | ICD-10-CM | POA: Insufficient documentation

## 2015-09-13 DIAGNOSIS — Z8249 Family history of ischemic heart disease and other diseases of the circulatory system: Secondary | ICD-10-CM | POA: Diagnosis not present

## 2015-09-13 DIAGNOSIS — G4733 Obstructive sleep apnea (adult) (pediatric): Secondary | ICD-10-CM | POA: Diagnosis not present

## 2015-09-13 DIAGNOSIS — I2510 Atherosclerotic heart disease of native coronary artery without angina pectoris: Secondary | ICD-10-CM | POA: Diagnosis not present

## 2015-09-13 DIAGNOSIS — Z7901 Long term (current) use of anticoagulants: Secondary | ICD-10-CM | POA: Insufficient documentation

## 2015-09-13 DIAGNOSIS — I495 Sick sinus syndrome: Secondary | ICD-10-CM | POA: Insufficient documentation

## 2015-09-13 DIAGNOSIS — M109 Gout, unspecified: Secondary | ICD-10-CM | POA: Insufficient documentation

## 2015-09-13 DIAGNOSIS — F319 Bipolar disorder, unspecified: Secondary | ICD-10-CM | POA: Diagnosis not present

## 2015-09-13 DIAGNOSIS — Z959 Presence of cardiac and vascular implant and graft, unspecified: Secondary | ICD-10-CM

## 2015-09-13 DIAGNOSIS — Z882 Allergy status to sulfonamides status: Secondary | ICD-10-CM | POA: Diagnosis not present

## 2015-09-13 DIAGNOSIS — Z87891 Personal history of nicotine dependence: Secondary | ICD-10-CM | POA: Diagnosis not present

## 2015-09-13 DIAGNOSIS — J45909 Unspecified asthma, uncomplicated: Secondary | ICD-10-CM | POA: Diagnosis not present

## 2015-09-13 DIAGNOSIS — I441 Atrioventricular block, second degree: Secondary | ICD-10-CM | POA: Diagnosis not present

## 2015-09-13 DIAGNOSIS — I10 Essential (primary) hypertension: Secondary | ICD-10-CM | POA: Diagnosis not present

## 2015-09-13 DIAGNOSIS — Z951 Presence of aortocoronary bypass graft: Secondary | ICD-10-CM | POA: Insufficient documentation

## 2015-09-13 DIAGNOSIS — E782 Mixed hyperlipidemia: Secondary | ICD-10-CM | POA: Diagnosis not present

## 2015-09-13 HISTORY — PX: INSERT / REPLACE / REMOVE PACEMAKER: SUR710

## 2015-09-13 HISTORY — DX: Gastro-esophageal reflux disease without esophagitis: K21.9

## 2015-09-13 HISTORY — DX: Acute myocardial infarction, unspecified: I21.9

## 2015-09-13 HISTORY — DX: Presence of cardiac pacemaker: Z95.0

## 2015-09-13 HISTORY — DX: Pneumonia, unspecified organism: J18.9

## 2015-09-13 HISTORY — DX: Personal history of other diseases of the digestive system: Z87.19

## 2015-09-13 HISTORY — DX: Atrioventricular block, second degree: I44.1

## 2015-09-13 HISTORY — PX: EP IMPLANTABLE DEVICE: SHX172B

## 2015-09-13 LAB — SURGICAL PCR SCREEN
MRSA, PCR: NEGATIVE
STAPHYLOCOCCUS AUREUS: NEGATIVE

## 2015-09-13 SURGERY — PACEMAKER IMPLANT
Anesthesia: LOCAL

## 2015-09-13 MED ORDER — ACETAMINOPHEN 325 MG PO TABS
325.0000 mg | ORAL_TABLET | ORAL | Status: DC | PRN
  Filled 2015-09-13: qty 2

## 2015-09-13 MED ORDER — CEFAZOLIN SODIUM-DEXTROSE 2-3 GM-% IV SOLR: 2.0000 g | INTRAVENOUS | Status: DC

## 2015-09-13 MED ORDER — MIDAZOLAM HCL 5 MG/5ML IJ SOLN
INTRAMUSCULAR | Status: DC | PRN
  Administered 2015-09-13 (×2): 1 mg via INTRAVENOUS

## 2015-09-13 MED ORDER — SODIUM CHLORIDE 0.9 % IV SOLN
INTRAVENOUS | Status: DC
  Administered 2015-09-13: 11:00:00 via INTRAVENOUS

## 2015-09-13 MED ORDER — SODIUM CHLORIDE 0.9 % IR SOLN
Status: AC
  Filled 2015-09-13: qty 2

## 2015-09-13 MED ORDER — CEFAZOLIN SODIUM-DEXTROSE 2-3 GM-% IV SOLR
INTRAVENOUS | Status: DC | PRN
  Administered 2015-09-13: 2 g via INTRAVENOUS

## 2015-09-13 MED ORDER — MUPIROCIN 2 % EX OINT
TOPICAL_OINTMENT | Freq: Two times a day (BID) | CUTANEOUS | Status: DC
  Administered 2015-09-13: 1 via NASAL

## 2015-09-13 MED ORDER — COLCHICINE 0.6 MG PO TABS
0.6000 mg | ORAL_TABLET | Freq: Every day | ORAL | Status: DC
  Administered 2015-09-14: 10:00:00 0.6 mg via ORAL
  Filled 2015-09-13: qty 1

## 2015-09-13 MED ORDER — QUETIAPINE FUMARATE 300 MG PO TABS
300.0000 mg | ORAL_TABLET | Freq: Every day | ORAL | Status: DC
  Administered 2015-09-13: 22:00:00 300 mg via ORAL
  Filled 2015-09-13 (×2): qty 1

## 2015-09-13 MED ORDER — SODIUM CHLORIDE 0.9 % IV SOLN: 250.0000 mL | INTRAVENOUS | Status: DC | PRN

## 2015-09-13 MED ORDER — IOHEXOL 350 MG/ML SOLN
INTRAVENOUS | Status: DC | PRN
  Administered 2015-09-13: 15 mL via INTRAVENOUS

## 2015-09-13 MED ORDER — SODIUM CHLORIDE 0.9 % IJ SOLN: 3.0000 mL | Freq: Two times a day (BID) | INTRAMUSCULAR | Status: DC

## 2015-09-13 MED ORDER — QUINAPRIL HCL 10 MG PO TABS
40.0000 mg | ORAL_TABLET | Freq: Every day | ORAL | Status: DC
  Administered 2015-09-14: 40 mg via ORAL
  Filled 2015-09-13: qty 4

## 2015-09-13 MED ORDER — LIDOCAINE HCL (PF) 1 % IJ SOLN
INTRAMUSCULAR | Status: AC
  Filled 2015-09-13: qty 60

## 2015-09-13 MED ORDER — HEPARIN (PORCINE) IN NACL 2-0.9 UNIT/ML-% IJ SOLN
INTRAMUSCULAR | Status: AC
Start: 2015-09-13 — End: 2015-09-13
  Filled 2015-09-13: qty 500

## 2015-09-13 MED ORDER — INDOMETHACIN ER 75 MG PO CPCR
75.0000 mg | ORAL_CAPSULE | Freq: Every day | ORAL | Status: DC
  Administered 2015-09-14: 75 mg via ORAL
  Filled 2015-09-13: qty 1

## 2015-09-13 MED ORDER — HEPARIN (PORCINE) IN NACL 2-0.9 UNIT/ML-% IJ SOLN
INTRAMUSCULAR | Status: DC | PRN
  Administered 2015-09-13: 12:00:00

## 2015-09-13 MED ORDER — SODIUM CHLORIDE 0.9 % IR SOLN
80.0000 mg | Status: AC
  Administered 2015-09-13: 80 mg
  Filled 2015-09-13: qty 2

## 2015-09-13 MED ORDER — CLONAZEPAM 0.5 MG PO TABS
1.0000 mg | ORAL_TABLET | Freq: Every day | ORAL | Status: DC
  Administered 2015-09-13: 22:00:00 1 mg via ORAL
  Filled 2015-09-13: qty 2

## 2015-09-13 MED ORDER — CEFAZOLIN SODIUM-DEXTROSE 2-3 GM-% IV SOLR
INTRAVENOUS | Status: AC
  Filled 2015-09-13: qty 50

## 2015-09-13 MED ORDER — MIDAZOLAM HCL 5 MG/5ML IJ SOLN
INTRAMUSCULAR | Status: AC
  Filled 2015-09-13: qty 5

## 2015-09-13 MED ORDER — INFLUENZA VAC SPLIT QUAD 0.5 ML IM SUSY
0.5000 mL | PREFILLED_SYRINGE | INTRAMUSCULAR | Status: DC
  Filled 2015-09-13: qty 0.5

## 2015-09-13 MED ORDER — CEFAZOLIN SODIUM 1-5 GM-% IV SOLN
1.0000 g | Freq: Four times a day (QID) | INTRAVENOUS | Status: AC
  Administered 2015-09-13 – 2015-09-14 (×3): 1 g via INTRAVENOUS
  Filled 2015-09-13 (×3): qty 50

## 2015-09-13 MED ORDER — MUPIROCIN 2 % EX OINT
TOPICAL_OINTMENT | CUTANEOUS | Status: AC
  Administered 2015-09-13: 1 via NASAL
  Filled 2015-09-13: qty 22

## 2015-09-13 MED ORDER — CHLORHEXIDINE GLUCONATE 4 % EX LIQD
60.0000 mL | Freq: Once | CUTANEOUS | Status: DC
  Filled 2015-09-13: qty 60

## 2015-09-13 MED ORDER — HYDROCODONE-ACETAMINOPHEN 5-325 MG PO TABS: 1.0000 | ORAL_TABLET | ORAL | Status: DC | PRN

## 2015-09-13 MED ORDER — FENTANYL CITRATE (PF) 100 MCG/2ML IJ SOLN
INTRAMUSCULAR | Status: AC
  Filled 2015-09-13: qty 2

## 2015-09-13 MED ORDER — SODIUM CHLORIDE 0.9 % IJ SOLN: 3.0000 mL | INTRAMUSCULAR | Status: DC | PRN

## 2015-09-13 MED ORDER — LIDOCAINE HCL (PF) 1 % IJ SOLN
INTRAMUSCULAR | Status: DC | PRN
  Administered 2015-09-13: 54 mL via INTRADERMAL

## 2015-09-13 MED ORDER — ONDANSETRON HCL 4 MG/2ML IJ SOLN: 4.0000 mg | Freq: Four times a day (QID) | INTRAMUSCULAR | Status: DC | PRN

## 2015-09-13 MED ORDER — ALLOPURINOL 300 MG PO TABS
300.0000 mg | ORAL_TABLET | Freq: Every day | ORAL | Status: DC
  Administered 2015-09-14: 300 mg via ORAL
  Filled 2015-09-13: qty 1

## 2015-09-13 MED ORDER — FENTANYL CITRATE (PF) 100 MCG/2ML IJ SOLN
INTRAMUSCULAR | Status: DC | PRN
  Administered 2015-09-13: 12.5 ug via INTRAVENOUS

## 2015-09-13 SURGICAL SUPPLY — 7 items
CABLE SURGICAL S-101-97-12 (CABLE) ×2 IMPLANT
LEAD CAPSURE NOVUS 45CM (Lead) ×2 IMPLANT
LEAD CAPSURE NOVUS 5076-58CM (Lead) ×2 IMPLANT
PAD DEFIB LIFELINK (PAD) ×2 IMPLANT
PPM ADVISA MRI DR A2DR01 (Pacemaker) ×2 IMPLANT
SHEATH CLASSIC 7F (SHEATH) ×4 IMPLANT
TRAY PACEMAKER INSERTION (PACKS) ×2 IMPLANT

## 2015-09-13 NOTE — Discharge Instructions (Signed)
° ° °  Supplemental Discharge Instructions for  Pacemaker/Defibrillator Patients  Activity No heavy lifting or vigorous activity with your left/right arm for 6 to 8 weeks.  Do not raise your left/right arm above your head for one week.  Gradually raise your affected arm as drawn below.           __       09/17/15                     09/18/15                  09/19/15                       09/20/15  NO DRIVING for  1 week   ; you may begin driving on  04/22/85   .  WOUND CARE - Keep the wound area clean and dry.  Do not get this area wet for one week. No showers for one week; you may shower on  09/20/15   . - The tape/steri-strips on your wound will fall off; do not pull them off.  No bandage is needed on the site.  DO  NOT apply any creams, oils, or ointments to the wound area. - If you notice any drainage or discharge from the wound, any swelling or bruising at the site, or you develop a fever > 101? F after you are discharged home, call the office at once.  Special Instructions - You are still able to use cellular telephones; use the ear opposite the side where you have your pacemaker/defibrillator.  Avoid carrying your cellular phone near your device. - When traveling through airports, show security personnel your identification card to avoid being screened in the metal detectors.  Ask the security personnel to use the hand wand. - Avoid arc welding equipment, MRI testing (magnetic resonance imaging), TENS units (transcutaneous nerve stimulators).  Call the office for questions about other devices. - Avoid electrical appliances that are in poor condition or are not properly grounded. - Microwave ovens are safe to be near or to operate.

## 2015-09-13 NOTE — Discharge Summary (Signed)
ELECTROPHYSIOLOGY PROCEDURE DISCHARGE SUMMARY    Patient ID: [name] [name],  MRN: [id number], DOB/AGE: 11/18/1955 59 y.o.  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  Hypertension 2.  Hyperlipidemia 3.  CAD s/p CABG 4.  Bipolar disorder  Allergies  Allergen Reactions  . Sulfonamide Derivatives Hives     Procedures This Admission:  1.  Implantation of a MDT dual chamber PPM on 09/13/15 by Dr Johney Frame.  The patient received a MDT model number Advisa PPM with model number 5076 right atrial lead and 5076 right ventricular lead. There were no immediate post procedure complications. 2.  CXR on 09-14-15 demonstrated no pneumothorax status post device implantation.   Brief HPI: [name] [name] is a 59 y.o. male with a past medical history as outlined above. He has been found to have symptomatic 2:1 heart block.  The patient has had symptomatic bradycardia without reversible causes identified.  Risks, benefits, and alternatives to PPM implantation were reviewed with the patient who wished to proceed.   Hospital Course:  The patient was admitted and underwent implantation of a MDT dual chamber pacemaker with details as outlined above.  He  was monitored on telemetry overnight which demonstrated sinus rhythm with V pacing.  Left chest was without hematoma or ecchymosis.  The device was interrogated and found to be functioning normally.  CXR was obtained and demonstrated no pneumothorax status post device implantation.  Wound care, arm mobility, and restrictions were reviewed with the patient.  The patient was examined and considered stable for discharge to home.    Physical Exam: Filed Vitals:   09/13/15 2014 09/13/15 2020  09/13/15 2235 09/14/15 0404  BP: 138/91  142/72 127/77  Pulse: 71   83  Temp: 97.4 F (36.3 C)   97.8 F (36.6 C)  TempSrc: Oral   Oral  Resp: 16 18 23 10   Height:      Weight:    191 lb 5.8 oz (86.8 kg)  SpO2: 97%   98%    GEN- The patient is well appearing, alert and oriented x 3 today.   HEENT: normocephalic, atraumatic; sclera clear, conjunctiva pink; hearing intact; oropharynx clear; neck supple  Lungs- Clear to ausculation bilaterally, normal work of breathing.  No wheezes, rales, rhonchi Heart- Regular rate and rhythm, no murmurs, rubs or gallops  GI- soft, non-tender, non-distended, bowel sounds present  Extremities- no clubbing, cyanosis, or edema; DP/PT/radial pulses 2+ bilaterally MS- no significant deformity or atrophy Skin- warm and dry, no rash or lesion, left chest without hematoma/ecchymosis Psych- euthymic mood, full affect Neuro- strength and sensation are intact   Labs:   Lab Results  Component Value Date   WBC 6.7 09/05/2015   HGB 15.7 09/05/2015   HCT 45.2 09/05/2015   MCV 86.9 09/05/2015   PLT 408* 09/05/2015   No results for input(s): NA, K, CL, CO2, BUN, CREATININE, CALCIUM, PROT, BILITOT, ALKPHOS, ALT, AST, GLUCOSE in the last 168 hours.  Invalid input(s): LABALBU  Discharge Medications:    Medication List    TAKE these medications        allopurinol 300 MG tablet  Commonly known as:  ZYLOPRIM  Take 300 mg by mouth daily.     apixaban 5 MG Tabs tablet  Commonly known as:  ELIQUIS  Take 1  tablet (5 mg total) by mouth 2 (two) times daily. Resume on 09/18/15     beta carotene w/minerals tablet  Take 1 tablet by mouth daily.     clonazePAM 1 MG tablet  Commonly known as:  KLONOPIN  Take 1 mg by mouth at bedtime.     COLCRYS 0.6 MG tablet  Generic drug:  colchicine  Take 0.6 mg by mouth daily.     indomethacin 75 MG CR capsule  Commonly known as:  INDOCIN SR  Take 75 mg by mouth daily.     nitroGLYCERIN 0.4 MG/SPRAY spray  Commonly  known as:  NITROLINGUAL  Place 1 spray under the tongue every 5 (five) minutes as needed for chest pain.     omeprazole 20 MG capsule  Commonly known as:  PRILOSEC  Take 20 mg by mouth daily.     quinapril 40 MG tablet  Commonly known as:  ACCUPRIL  Take 40 mg by mouth daily.     SEROQUEL 300 MG tablet  Generic drug:  QUEtiapine  Take 300 mg by mouth at bedtime.     ZYRTEC ALLERGY 10 MG tablet  Generic drug:  cetirizine  Take 10 mg by mouth daily.        Disposition:  Discharge Instructions    Diet - low sodium heart healthy    Complete by:  As directed      Increase activity slowly    Complete by:  As directed           Follow-up Information    Follow up with [facility] On 09/27/2015.   Specialty:  Cardiology   Why:  at [facility] for wound check    Contact information:   [address], [address] [place] [address] [phone]      Follow up with Lewayne Bunting, MD On 12/15/2015.   Specialty:  Cardiology   Why:  at 3:15PM   Contact information:   [address] [place] [address] [phone]       Duration of Discharge Encounter: Greater than 30 minutes including physician time.  Signed, Gypsy Balsam, NP 09/14/2015 7:20 AM   I have seen, examined the patient, and reviewed the above assessment and plan.  Device interrogation is reviewed and normal.  Changes to above are made where necessary.    Co Sign: Hillis Range, MD 09/14/2015 9:14 AM

## 2015-09-13 NOTE — Interval H&P Note (Signed)
History and Physical Interval Note:  09/13/2015 11:44 AM  [name] [name]  has presented today for surgery, with the diagnosis of heart block  The various methods of treatment have been discussed with the patient and family. After consideration of risks, benefits and other options for treatment, the patient has consented to  Procedure(s): Pacemaker Implant (N/A) as a surgical intervention .  The patient's history has been reviewed, patient examined, no change in status, stable for surgery.  I have reviewed the patient's chart and labs.  Questions were answered to the patient's satisfaction.    The patient has symptomatic intermittent second degree AV block and long first degree AV block at baseline.  Also has h/o afib.  Sinus rates appear preserved.  No reversible causes.  I agree with Dr Ladona Ridgel that PPM is indicated for symptomatic second degree AV block.  Risks, benefits, alternatives to pacemaker implantation were discussed in detail with the patient today. The patient understands that the risks include but are not limited to bleeding, infection, pneumothorax, perforation, tamponade, vascular damage, renal failure, MI, stroke, death,  and lead dislodgement and wishes to proceed.  I have been very clear that he should not lift > 5 pounds for 6 weeks post implant.  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    We also discussed current pacemaker technologies including MRI compatibility.  He reports having numerous MRIs previously and 4 prior back surgeries.  He is clear that he would prefer MRI compatible pacemaker at implant.  He is aware that Medtronic has the only MRI compatible pacemaker on [facility] formulary at this time.  He wishes to proceed with a Medtronic PPM at this time.  Hillis Range MD, [facility] 09/13/2015 11:48 AM

## 2015-09-13 NOTE — H&P (View-Only) (Signed)
    HPI [name] returns today to consider permanent pacemaker insertion. He is a 59-year-old man with known coronary artery disease, status post bypass surgery, hypertension, and gout. The patient has had bradycardia in the past. In 2014, he underwent treadmill testing demonstrating an appropriate heart rate response to exercise, with his AV conduction improving and sinus rate increasing to over 115 bpm. Over the last several months, the patient has had intermittent episodes of fatigue and weakness, and has been noted to have heart rates in the 30s. Review of his ECGs and telemetry strips during episodes demonstrated 2-1 heart block. He has never had frank syncope. He was seen for a stress test several weeks ago and the test was cancelled. He does not have palpitations but he does have fatigue and weakness and I suspect he is out of rhythm more than he knows. He was also seen in the [place] ER in the past few weeks with more symptomatic bradycardia.  Allergies  Allergen Reactions  . Sulfonamide Derivatives Hives     Current Outpatient Prescriptions  Medication Sig Dispense Refill  . allopurinol (ZYLOPRIM) 300 MG tablet Take 300 mg by mouth daily.      . apixaban (ELIQUIS) 5 MG TABS tablet Take 1 tablet (5 mg total) by mouth 2 (two) times daily. 60 tablet 5  . beta carotene w/minerals (OCUVITE) tablet Take 1 tablet by mouth daily.    . cetirizine (ZYRTEC ALLERGY) 10 MG tablet Take 10 mg by mouth daily.      . clonazePAM (KLONOPIN) 1 MG tablet Take 1 mg by mouth at bedtime.     . COLCRYS 0.6 MG tablet Take 0.6 mg by mouth daily.   0  . furosemide (LASIX) 20 MG tablet Take 20 mg by mouth daily as needed for edema.    . indomethacin (INDOCIN SR) 75 MG CR capsule Take 75 mg by mouth daily.     . nitroGLYCERIN (NITROLINGUAL) 0.4 MG/SPRAY spray Place 1 spray under the tongue every 5 (five) minutes as needed for chest pain. 4.9 g 3  . omeprazole (PRILOSEC) 20 MG capsule Take 20 mg by mouth  daily.      . QUEtiapine (SEROQUEL) 300 MG tablet Take 300 mg by mouth at bedtime.      . quinapril (ACCUPRIL) 40 MG tablet Take 40 mg by mouth daily.     No current facility-administered medications for this visit.     Past Medical History  Diagnosis Date  . Mixed hyperlipidemia   . Essential hypertension   . Asthma   . Gout   . ED (erectile dysfunction)   . Bipolar 1 disorder (HCC)   . Bradycardia     Very limited Wenkebach on event recorder November 2010  . 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(HCC)   . CAD (coronary artery disease) 2006    Multivessel status post CABG in [place], patent LIMA to LAD and SVG to diagonal with 40% stenosis 2010  . OSA (obstructive sleep apnea)     patient denies, stated had a sleep study and he was told he did not have    . Atrial fibrillation (HCC)     new November 2016    ROS:   All systems reviewed and negative except as noted in the HPI.   Past Surgical History  Procedure Laterality Date  . Back surgery      X4  . Coronary artery bypass graft  2006    [place]  . Left heart catheterization with coronary/graft   angiogram N/A 12/05/2014    Procedure: LEFT HEART CATHETERIZATION WITH Isabel Caprice;  Surgeon: Peter M Swaziland, MD;  Location: [facility] CATH LAB;  Service: Cardiovascular;  Laterality: N/A;     Family History  Problem Relation Age of Onset  . COPD Mother   . Lung cancer Mother   . Stroke Mother   . Heart attack Mother   . Heart disease Father   . Heart attack Father   . Hyperlipidemia Brother   . Hypertension Brother   . Hypertension Brother   . Hypertension Sister      Social History   Social History  . Marital Status: Married    Spouse Name: N/A  . Number of Children: N/A  . Years of Education: N/A   Occupational History  . Not on file.   Social History Main Topics  . Smoking status: Former Smoker -- 2.00 packs/day for 17 years    Types: Cigarettes    Quit date: 09/16/1985  . Smokeless tobacco:  Current User    Types: Snuff     Comment: qyut chewing tobacco 3 months ago  . Alcohol Use: No     Comment: the patient has remote history ETOH and "pills" none of either in over 9 years  . Drug Use: No  . Sexual Activity: Yes    Birth Control/ Protection: None   Other Topics Concern  . 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      BP 128/78 mmHg  Pulse 79  Ht 5\' 7"  (1.702 m)  Wt 198 lb (89.812 kg)  BMI 31.00 kg/m2  Physical Exam:  Well appearing 59 year old man, NAD HEENT: Unremarkable Neck:  6 cm JVD, no thyromegally Lymphatics:  No adenopathy Back:  No CVA tenderness Lungs:  Clear, with no wheezes, rales, or rhonchi. HEART:  Regular rate rhythm, no murmurs, no rubs, no clicks Abd:  soft, positive bowel sounds, no organomegally, no rebound, no guarding Ext:  2 plus pulses, no edema, no cyanosis, no clubbing Skin:  No rashes no nodules Neuro:  CN II through XII intact, motor grossly intact  EKG - NSR with a long PR interval and iRBBB  Assess/Plan:

## 2015-09-14 ENCOUNTER — Ambulatory Visit (HOSPITAL_COMMUNITY): Payer: BLUE CROSS/BLUE SHIELD

## 2015-09-14 ENCOUNTER — Telehealth: Payer: Self-pay | Admitting: Internal Medicine

## 2015-09-14 DIAGNOSIS — Z951 Presence of aortocoronary bypass graft: Secondary | ICD-10-CM | POA: Diagnosis not present

## 2015-09-14 DIAGNOSIS — M109 Gout, unspecified: Secondary | ICD-10-CM | POA: Diagnosis not present

## 2015-09-14 DIAGNOSIS — I2510 Atherosclerotic heart disease of native coronary artery without angina pectoris: Secondary | ICD-10-CM | POA: Diagnosis not present

## 2015-09-14 DIAGNOSIS — I441 Atrioventricular block, second degree: Secondary | ICD-10-CM

## 2015-09-14 MED ORDER — YOU HAVE A PACEMAKER BOOK
Freq: Once | Status: AC
  Administered 2015-09-14: 03:00:00 1
  Filled 2015-09-14: qty 1

## 2015-09-14 MED ORDER — APIXABAN 5 MG PO TABS: 5.0000 mg | ORAL_TABLET | Freq: Two times a day (BID) | ORAL | Status: DC

## 2015-09-14 NOTE — Telephone Encounter (Signed)
New Message   Pt wants to inform Dr.Taylor that he has filed a short term Disability clam

## 2015-09-14 NOTE — Telephone Encounter (Signed)
As Info 

## 2015-09-14 NOTE — Progress Notes (Signed)
D/c instructions reviewed with pt and wife by RN Victorino Dike O'Neal. Copy of instructions given to pt. Pt d/c'd via wheelchair with belongings with wife, escorted by hospital volunteer.

## 2015-09-26 ENCOUNTER — Ambulatory Visit: Payer: BLUE CROSS/BLUE SHIELD | Admitting: Internal Medicine

## 2015-09-27 ENCOUNTER — Encounter: Payer: Self-pay | Admitting: Internal Medicine

## 2015-09-27 ENCOUNTER — Ambulatory Visit (INDEPENDENT_AMBULATORY_CARE_PROVIDER_SITE_OTHER): Payer: BLUE CROSS/BLUE SHIELD | Admitting: *Deleted

## 2015-09-27 DIAGNOSIS — I495 Sick sinus syndrome: Secondary | ICD-10-CM

## 2015-09-27 DIAGNOSIS — I442 Atrioventricular block, complete: Secondary | ICD-10-CM

## 2015-09-27 LAB — CUP PACEART INCLINIC DEVICE CHECK
Battery Remaining Longevity: 114 mo
Battery Voltage: 3.07 V
Brady Statistic AP VP Percent: 2.23 %
Brady Statistic AS VS Percent: 81.25 %
Brady Statistic RV Percent Paced: 10.49 %
Implantable Lead Implant Date: 20161228
Implantable Lead Location: 753859
Implantable Lead Location: 753860
Implantable Lead Model: 5076
Lead Channel Impedance Value: 304 Ohm
Lead Channel Impedance Value: 437 Ohm
Lead Channel Impedance Value: 532 Ohm
Lead Channel Pacing Threshold Amplitude: 0.75 V
Lead Channel Pacing Threshold Amplitude: 0.75 V
Lead Channel Pacing Threshold Pulse Width: 0.4 ms
Lead Channel Sensing Intrinsic Amplitude: 3 mV
MDC IDC LEAD IMPLANT DT: 20161228
MDC IDC MSMT LEADCHNL RV IMPEDANCE VALUE: 418 Ohm
MDC IDC MSMT LEADCHNL RV PACING THRESHOLD PULSEWIDTH: 0.4 ms
MDC IDC MSMT LEADCHNL RV SENSING INTR AMPL: 11.5 mV
MDC IDC SESS DTM: 20170111165530
MDC IDC SET LEADCHNL RA PACING AMPLITUDE: 3.5 V
MDC IDC SET LEADCHNL RV PACING AMPLITUDE: 3.5 V
MDC IDC SET LEADCHNL RV PACING PULSEWIDTH: 0.4 ms
MDC IDC SET LEADCHNL RV SENSING SENSITIVITY: 2.8 mV
MDC IDC STAT BRADY AP VS PERCENT: 8.26 %
MDC IDC STAT BRADY AS VP PERCENT: 8.26 %
MDC IDC STAT BRADY RA PERCENT PACED: 10.49 %

## 2015-09-27 NOTE — Progress Notes (Signed)
Wound check appointment. Steri-strips removed. Wound without redness or edema. Incision edges approximated, wound well healed. Normal device function. Thresholds, sensing, and impedances consistent with implant measurements. Device programmed at 3.5V for extra safety margin until 3 month visit. Histogram distribution appropriate for patient and level of activity. No mode switches or high ventricular rates noted. Patient educated about wound care, arm mobility, lifting restrictions. Carelink smart tested in office. ROV with GT 12/15/15.

## 2015-09-29 ENCOUNTER — Encounter: Payer: Self-pay | Admitting: *Deleted

## 2015-12-15 ENCOUNTER — Ambulatory Visit (INDEPENDENT_AMBULATORY_CARE_PROVIDER_SITE_OTHER): Payer: BLUE CROSS/BLUE SHIELD | Admitting: Internal Medicine

## 2015-12-15 ENCOUNTER — Encounter: Payer: Self-pay | Admitting: Internal Medicine

## 2015-12-15 VITALS — BP 136/82 | HR 70 | Ht 67.0 in | Wt 200.8 lb

## 2015-12-15 DIAGNOSIS — I495 Sick sinus syndrome: Secondary | ICD-10-CM | POA: Diagnosis not present

## 2015-12-15 DIAGNOSIS — I442 Atrioventricular block, complete: Secondary | ICD-10-CM | POA: Diagnosis not present

## 2015-12-15 LAB — CUP PACEART INCLINIC DEVICE CHECK
Battery Voltage: 3.04 V
Brady Statistic AP VP Percent: 2.62 %
Brady Statistic AP VS Percent: 3.16 %
Brady Statistic AS VP Percent: 19.67 %
Brady Statistic RA Percent Paced: 5.77 %
Date Time Interrogation Session: 20170331161047
Implantable Lead Implant Date: 20161228
Implantable Lead Location: 753860
Implantable Lead Model: 5076
Lead Channel Impedance Value: 513 Ohm
Lead Channel Impedance Value: 570 Ohm
Lead Channel Pacing Threshold Pulse Width: 0.4 ms
Lead Channel Sensing Intrinsic Amplitude: 15.875 mV
Lead Channel Sensing Intrinsic Amplitude: 4 mV
Lead Channel Setting Pacing Amplitude: 2 V
Lead Channel Setting Pacing Amplitude: 2.5 V
Lead Channel Setting Sensing Sensitivity: 2.8 mV
MDC IDC LEAD IMPLANT DT: 20161228
MDC IDC LEAD LOCATION: 753859
MDC IDC MSMT BATTERY REMAINING LONGEVITY: 116 mo
MDC IDC MSMT LEADCHNL RA IMPEDANCE VALUE: 342 Ohm
MDC IDC MSMT LEADCHNL RA IMPEDANCE VALUE: 475 Ohm
MDC IDC MSMT LEADCHNL RA PACING THRESHOLD AMPLITUDE: 0.75 V
MDC IDC MSMT LEADCHNL RA PACING THRESHOLD PULSEWIDTH: 0.4 ms
MDC IDC MSMT LEADCHNL RV PACING THRESHOLD AMPLITUDE: 0.75 V
MDC IDC SET LEADCHNL RV PACING PULSEWIDTH: 0.4 ms
MDC IDC STAT BRADY AS VS PERCENT: 74.56 %
MDC IDC STAT BRADY RV PERCENT PACED: 22.28 %

## 2015-12-15 NOTE — Progress Notes (Signed)
HPI [name] [name] returns today s/p permanent pacemaker insertion. He is a 60 year old man with known coronary artery disease, status post bypass surgery, hypertension, and gout. The patient has had bradycardia in the past. He has never had frank syncope. He thinks that he is improved since his PPM was placed. At times he feels his heart go out of rhythm but thinks that the HR does not fluctuate as much. No other complaints. Allergies  Allergen Reactions  . Sulfonamide Derivatives Hives     Current Outpatient Prescriptions  Medication Sig Dispense Refill  . allopurinol (ZYLOPRIM) 300 MG tablet Take 300 mg by mouth daily.      [name] apixaban (ELIQUIS) 5 MG TABS tablet Take 1 tablet (5 mg total) by mouth 2 (two) times daily. Resume on 09/18/15 60 tablet 5  . beta carotene w/minerals (OCUVITE) tablet Take 1 tablet by mouth daily.    . cetirizine (ZYRTEC ALLERGY) 10 MG tablet Take 10 mg by mouth daily.      . clonazePAM (KLONOPIN) 1 MG tablet Take 1 mg by mouth at bedtime.     [name] COLCRYS 0.6 MG tablet Take 0.6 mg by mouth daily.   0  . indomethacin (INDOCIN SR) 75 MG CR capsule Take 75 mg by mouth daily.     . nitroGLYCERIN (NITROLINGUAL) 0.4 MG/SPRAY spray Place 1 spray under the tongue every 5 (five) minutes as needed for chest pain. 4.9 g 3  . omeprazole (PRILOSEC) 20 MG capsule Take 20 mg by mouth daily.      . QUEtiapine (SEROQUEL) 300 MG tablet Take 300 mg by mouth at bedtime.      . quinapril (ACCUPRIL) 40 MG tablet Take 40 mg by mouth daily.     No current facility-administered medications for this visit.     Past Medical History  Diagnosis Date  . Mixed hyperlipidemia     pt states "they took me off q med they had me on for high cholesterol; my good cholesterol is low" (09/13/2015)  . Essential hypertension   . Asthma   . Gout   . ED (erectile dysfunction)   . Bradycardia     Very limited Wenkebach on event recorder November 2010  . 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(HCC)     . CAD (coronary artery disease) 2006    Multivessel status post CABG in [place], patent LIMA to LAD and SVG to diagonal with 40% stenosis 2010  . OSA (obstructive sleep apnea)     patient denies, stated had a sleep study and he was told he did not have    . Atrial fibrillation [facility])     new November 2016  . Presence of permanent cardiac pacemaker   . Myocardial infarction (HCC) 2006  . Pneumonia 2015  . Pneumonia 1990's X 4 in one year    "double pneumonia"  . GERD (gastroesophageal reflux disease)   . History of stomach ulcers 1980's  . Bipolar 1 disorder (HCC)     "put me on this after I got out of drug rehab 11/18/2005 and couldn't sleep; added anxiety RX and have been sleeping fine since"    ROS:   All systems reviewed and negative except as noted in the HPI.   Past Surgical History  Procedure Laterality Date  . Left heart catheterization with coronary/graft angiogram N/A 12/05/2014    Procedure: LEFT HEART CATHETERIZATION WITH Isabel Caprice;  Surgeon: Peter M Swaziland, MD;  Location: [facility] CATH LAB;  Service: Cardiovascular;  Laterality: N/A;  .  Insert / replace / remove pacemaker  09/13/2015    Medtronic  . Back surgery    . Lumbar disc surgery  X 3    "ruptured disc"  . Maximum access (mas)posterior lumbar interbody fusion (plif) 1 level  ~ 2003    "L4-5; put rods & screws in"  . Coronary artery bypass graft  2006    "CABG X2, in [place]"  . Cardiac catheterization      "I've had quite a few; don't remember any stents" (09/13/2015)  . Leg surgery Left 1961    "had 5 boils come up on my leg; they had to cut them out and put a drainage tube in there"  . Coronary angioplasty    . Ep implantable device N/A 09/13/2015    Procedure: Pacemaker Implant;  Surgeon: Hillis Range, MD;  Location: [facility] INVASIVE CV LAB;  Service: Cardiovascular;  Laterality: N/A;     Family History  Problem Relation Age of Onset  . COPD Mother   . Lung cancer Mother   . Stroke Mother    . Heart attack Mother   . Heart disease Father   . Heart attack Father   . Hyperlipidemia Brother   . Hypertension Brother   . Hypertension Brother   . Hypertension Sister      Social History   Social History  . Marital Status: Married    Spouse Name: N/A  . Number of Children: N/A  . Years of Education: N/A   Occupational History  . Not on file.   Social History Main Topics  . Smoking status: Former Smoker -- 2.00 packs/day for 17 years    Types: Cigarettes    Quit date: 07/17/1986  . Smokeless tobacco: Current User    Types: Snuff     Comment: 09/13/2015 "quit chewing tobacco August 2016"  . Alcohol Use: 0.0 oz/week    0 Standard drinks or equivalent per week     Comment: 09/13/2015 "got out of alcohol rehab 11/18/2005"  . Drug Use: Yes     Comment: 09/13/2015 "got out of rehab for pills 11/18/2005"  . Sexual Activity: Yes    Birth Control/ Protection: None   Other Topics Concern  . 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      BP 136/82 mmHg  Pulse 70  Ht  (1.702 m)  Wt 200 lb 12.8 oz (91.082 kg)  BMI 31.44 kg/m2  Physical Exam:  Well appearing 60 year old man, NAD HEENT: Unremarkable Neck:  6 cm JVD, no thyromegally Lymphatics:  No adenopathy Back:  No CVA tenderness Lungs:  Clear, with no wheezes, rales, or rhonchi. Well healed PPM incision. HEART:  Regular rate rhythm, no murmurs, no rubs, no clicks Abd:  soft, positive bowel sounds, no organomegally, no rebound, no guarding Ext:  2 plus pulses, no edema, no cyanosis, no clubbing Skin:  No rashes no nodules Neuro:  CN II through XII intact, motor grossly intact  EKG - NSR with ventricular pacing  A/P 1. High grade heart block - he is now s/p PPM and has underlying conduction today. He is pacing about 20% of the time. 2. PAF - he is in NSR 99% of the time. I considered starting an AA drug but will hold off until he becomes more  symptomatic.  3. HTN - his blood pressure is controlled. He will continue his current meds. 4. CAD - he is s/p CABG and appears to  be doing well. Will follow.  [name] [name],M.D.  Assess/Plan:

## 2015-12-15 NOTE — Patient Instructions (Signed)
Medication Instructions:  Your physician recommends that you continue on your current medications as directed. Please refer to the Current Medication list given to you today.   Labwork: None ordered   Testing/Procedures: None ordered   Follow-Up: Your physician wants you to follow-up in: 9 months with Dr Johney Frame in [place] You will receive a reminder letter in the mail two months in advance. If you don't receive a letter, please call our office to schedule the follow-up appointment.  Remote monitoring is used to monitor your Pacemaker  from home. This monitoring reduces the number of office visits required to check your device to one time per year. It allows [place] to keep an eye on the functioning of your device to ensure it is working properly. You are scheduled for a device check from home on 03/18/14. You may send your transmission at any time that day. If you have a wireless device, the transmission will be sent automatically. After your physician reviews your transmission, you will receive a postcard with your next transmission date.     Any Other Special Instructions Will Be Listed Below (If Applicable).     If you need a refill on your cardiac medications before your next appointment, please call your pharmacy.

## 2016-01-30 ENCOUNTER — Telehealth: Payer: Self-pay | Admitting: Internal Medicine

## 2016-01-30 NOTE — Telephone Encounter (Signed)
Pt calling to inform Dr Johney Frame and nurse that for over a week now, he has been experiencing on and off again chest burning and extreme right foot pain. Pt states that this all started when he noticed the arch in his right foot become extremely swollen, noted redness, warm-to-touch, and very painful. Pt states that the pain is in the arch of his right foot and its very red in appearance.  Pt states he is afebrile. Pt states that when his foot started hurting and noticed it had an abnormal appearance, he then started taking BC powder to relieve the pain.  Pt states that once he started taking about 1-3 BC powders a day, he noted that his chest started burning on and off. Pt states that he has no chest pain, nausea, vomiting, sweating, sob, doe, palpitations, dizziness, pre-syncopal, or syncopal issues at this time, or during the course of his right foot pain.  Pt states he has only had chest burning.  Pt states that he has taken his colchicine as well, for his gout, for he figured that his foot pain was related to this.  Pt states that his right foot pain is so extreme, that he cannot put a sock or shoe on that foot.  Pt states his left extremity and his left foot is normal in appearance. Pt states that in his right leg there is no streaking or redness noted.  Pt states its only in the arch of his right foot.  Pt states that his right foot is extremely swollen.  Pt has no cardiac complaints at this time.  Pt states that when the chest burning came on, he did not feel that taking a Nitro was appropriate at that time, for he felt this wasn't cardiac in nature.  Pt calling to ask for advise on what he should do.  Informed the pt that Dr Johney Frame and his RN are both out of the office today.  Advised the pt that he should absolutely avoid taking all NSAIDS and BC powder from here on out, for this is contraindicated with his cardiac meds he is currently taking.  Advised the pt that being he's had on and off  again chest burning, and noted redness, swelling, extreme pain, and warm-to-touch in that right foot, he should refer to the ER now for further work-up of possible clot or infection in that foot.   Informed the pt that it could very well be his gout, but given his complaints and symptoms mentioned, he should have this evaluated tonight, to rule out any acute issues.  Pt refuses to go to the ER tonight, but he did however state that he will report to the Urgent Care above his house right now, for further evaluation.  Advised the pt that he should call our office back tomorrow to report to Dr Johney Frame and RN of his current symptoms, and what the MD at Urgent Care's assessment and plan was for the pt.  Informed the pt that if the Urgent Care MD feels this is cardiac in nature and recommends him to go to the ER for further work-up, then he should report to [facility] ER then.  Reiterated again to the pt that he absolutely needs to avoid taking any NSAIDS and BC powders.  Informed the pt that I will route this message to Dr Johney Frame and his RN for further review, recommendation, and follow-up with the pt thereafter. Pt verbalized understanding and agrees with this plan.  Pt gracious for all  the assistance provided.

## 2016-01-30 NOTE — Telephone Encounter (Signed)
Pt c/o of Chest Pain: 1. Are you having CP right now? no 2. Are you experiencing any other symptoms (ex. SOB, nausea, vomiting, sweating)? Right foot swelling-yesterday 3. How long have you been experiencing CP? Comes and Goes started on weekend 4. Is your CP continuous or coming and going? Coming and going 5. Have you taken Nitroglycerin? No   [phone]

## 2016-02-01 NOTE — Telephone Encounter (Signed)
Please follow-up with patient to make sure that his issue was addressed at Urgent Care and that he is doing better.

## 2016-02-02 NOTE — Telephone Encounter (Signed)
Left a message for patient in regards to his call with Ivy to see if he was seen and taken care of.  I have asked him to call me back if needed

## 2016-03-05 ENCOUNTER — Telehealth: Payer: Self-pay | Admitting: Internal Medicine

## 2016-03-05 NOTE — Telephone Encounter (Signed)
New message    The pt just got out of the hospital and needs a return to work note on July 14 th.

## 2016-03-05 NOTE — Telephone Encounter (Signed)
Returned call and wife stated she had already gotten somebody to take care of.  We do not show he was in the hospital and can not write a note when we did not treat.

## 2016-03-18 ENCOUNTER — Ambulatory Visit (INDEPENDENT_AMBULATORY_CARE_PROVIDER_SITE_OTHER): Payer: BLUE CROSS/BLUE SHIELD | Admitting: *Deleted

## 2016-03-18 ENCOUNTER — Telehealth: Payer: Self-pay | Admitting: Cardiology

## 2016-03-18 DIAGNOSIS — I495 Sick sinus syndrome: Secondary | ICD-10-CM

## 2016-03-18 NOTE — Telephone Encounter (Signed)
Spoke to patient's wife regarding sx's since device implant. Wife states that husband told her that he is tired all the time and that he is having CP, ShOB, and LE edema as well. Wife states that husband went to the hospital in [place] on 6/25 for these same sx's. Lasix was started during hospitalization.  Wife states that patient's CP (7-8/10 on pain scale) is occurring during patient activities and during periods of rest. Patient becomes diaphoretic with the CP, but denies any radiation. Pain is relieved by 1-3 sprays of nitroglycerin.  Patient denies any CP at present. I explained to wife that patient's pacemaker function is stable. Presenting rhythm: SR 92bpm, last episode on 6/21-episodes are AT/AF per EGMs.  I explained to wife that patient needs to be seen in the office for further evaluation of his symptoms.   Patient currently has an appt scheduled for 7/13 to f/u with Dr.Taylor. I offered wife an appointment on 7/5 with Dr.Taylor. Wife declined bc she and her husband are currently on their way to [place], [place].   I encouraged wife/husband to keep follow up as scheduled and go to local ER for any further episodes of CP. Wife voiced understanding.  Will forward information to Dr.Taylor and notify wife/patient if anything further is recommended.

## 2016-03-18 NOTE — Telephone Encounter (Signed)
Pt wife called. Instructed her how to send a manual transmission. Pt wife stated that pt has felt worse since device has been implanted. Pt wife stated that pt is more short of breath, and more fatigued since having device implanted and he can feel his heart pound in his chest. Please call the wife back at [phone].

## 2016-03-18 NOTE — Progress Notes (Signed)
Remote pacemaker transmission.   

## 2016-03-21 LAB — CUP PACEART REMOTE DEVICE CHECK
Battery Voltage: 3.02 V
Brady Statistic RA Percent Paced: 7.58 %
Implantable Lead Implant Date: 20161228
Implantable Lead Location: 753860
Implantable Lead Model: 5076
Implantable Lead Model: 5076
Lead Channel Impedance Value: 285 Ohm
Lead Channel Pacing Threshold Amplitude: 1 V
Lead Channel Pacing Threshold Pulse Width: 0.4 ms
Lead Channel Pacing Threshold Pulse Width: 0.4 ms
Lead Channel Sensing Intrinsic Amplitude: 2 mV
Lead Channel Setting Pacing Pulse Width: 0.4 ms
Lead Channel Setting Sensing Sensitivity: 2.8 mV
MDC IDC LEAD IMPLANT DT: 20161228
MDC IDC LEAD LOCATION: 753859
MDC IDC MSMT BATTERY REMAINING LONGEVITY: 108 mo
MDC IDC MSMT LEADCHNL RA IMPEDANCE VALUE: 437 Ohm
MDC IDC MSMT LEADCHNL RA SENSING INTR AMPL: 2 mV
MDC IDC MSMT LEADCHNL RV IMPEDANCE VALUE: 418 Ohm
MDC IDC MSMT LEADCHNL RV IMPEDANCE VALUE: 494 Ohm
MDC IDC MSMT LEADCHNL RV PACING THRESHOLD AMPLITUDE: 0.5 V
MDC IDC MSMT LEADCHNL RV SENSING INTR AMPL: 11.625 mV
MDC IDC MSMT LEADCHNL RV SENSING INTR AMPL: 11.625 mV
MDC IDC SESS DTM: 20170703132542
MDC IDC SET LEADCHNL RA PACING AMPLITUDE: 2 V
MDC IDC SET LEADCHNL RV PACING AMPLITUDE: 2.5 V
MDC IDC STAT BRADY AP VP PERCENT: 3.91 %
MDC IDC STAT BRADY AP VS PERCENT: 3.68 %
MDC IDC STAT BRADY AS VP PERCENT: 27.8 %
MDC IDC STAT BRADY AS VS PERCENT: 64.62 %
MDC IDC STAT BRADY RV PERCENT PACED: 31.7 %

## 2016-03-22 ENCOUNTER — Encounter: Payer: Self-pay | Admitting: Cardiology

## 2016-03-28 ENCOUNTER — Encounter: Payer: Self-pay | Admitting: *Deleted

## 2016-03-28 ENCOUNTER — Ambulatory Visit (INDEPENDENT_AMBULATORY_CARE_PROVIDER_SITE_OTHER): Payer: BLUE CROSS/BLUE SHIELD | Admitting: Internal Medicine

## 2016-03-28 ENCOUNTER — Encounter: Payer: Self-pay | Admitting: Internal Medicine

## 2016-03-28 VITALS — BP 130/98 | HR 80 | Ht 67.5 in | Wt 219.0 lb

## 2016-03-28 DIAGNOSIS — I495 Sick sinus syndrome: Secondary | ICD-10-CM | POA: Diagnosis not present

## 2016-03-28 LAB — CUP PACEART INCLINIC DEVICE CHECK
Battery Voltage: 3.02 V
Brady Statistic AP VP Percent: 3.56 %
Brady Statistic AP VS Percent: 3.53 %
Brady Statistic AS VP Percent: 26.81 %
Date Time Interrogation Session: 20170713162724
Implantable Lead Location: 753860
Lead Channel Impedance Value: 418 Ohm
Lead Channel Impedance Value: 494 Ohm
Lead Channel Pacing Threshold Amplitude: 0.5 V
Lead Channel Sensing Intrinsic Amplitude: 12.5 mV
Lead Channel Sensing Intrinsic Amplitude: 2.75 mV
Lead Channel Setting Pacing Amplitude: 2.25 V
Lead Channel Setting Pacing Pulse Width: 0.4 ms
Lead Channel Setting Sensing Sensitivity: 2.8 mV
MDC IDC LEAD IMPLANT DT: 20161228
MDC IDC LEAD IMPLANT DT: 20161228
MDC IDC LEAD LOCATION: 753859
MDC IDC MSMT BATTERY REMAINING LONGEVITY: 110 mo
MDC IDC MSMT LEADCHNL RA IMPEDANCE VALUE: 304 Ohm
MDC IDC MSMT LEADCHNL RA IMPEDANCE VALUE: 456 Ohm
MDC IDC MSMT LEADCHNL RA PACING THRESHOLD AMPLITUDE: 1.125 V
MDC IDC MSMT LEADCHNL RA PACING THRESHOLD PULSEWIDTH: 0.4 ms
MDC IDC MSMT LEADCHNL RV PACING THRESHOLD PULSEWIDTH: 0.4 ms
MDC IDC SET LEADCHNL RV PACING AMPLITUDE: 2.5 V
MDC IDC STAT BRADY AS VS PERCENT: 66.1 %
MDC IDC STAT BRADY RA PERCENT PACED: 7.09 %
MDC IDC STAT BRADY RV PERCENT PACED: 30.37 %

## 2016-03-28 MED ORDER — METOPROLOL TARTRATE 25 MG PO TABS: 25.0000 mg | ORAL_TABLET | Freq: Every day | ORAL | Status: DC | PRN

## 2016-03-28 NOTE — Progress Notes (Signed)
HPI [name] returns today s/p permanent pacemaker insertion. He is a 60 year old man with known coronary artery disease, status post bypass surgery, hypertension, and gout. The patient has had bradycardia in the past. He has never had frank syncope. He thinks that he is improved since his PPM was placed. At times he feels his heart go out of rhythm but thinks that the HR does not fluctuate as much. He describes an episode of palpitations associated with syncope and sob which correlates to a one hour episode of atrial fib which occurred while at work. Allergies  Allergen Reactions  . Sulfonamide Derivatives Hives     Current Outpatient Prescriptions  Medication Sig Dispense Refill  . allopurinol (ZYLOPRIM) 300 MG tablet Take 300 mg by mouth daily.      [name] apixaban (ELIQUIS) 5 MG TABS tablet Take 1 tablet (5 mg total) by mouth 2 (two) times daily. Resume on 09/18/15 60 tablet 5  . beta carotene w/minerals (OCUVITE) tablet Take 1 tablet by mouth daily.    . cetirizine (ZYRTEC ALLERGY) 10 MG tablet Take 10 mg by mouth daily.      [name] COLCRYS 0.6 MG tablet Take 0.6 mg by mouth daily as needed (Gout).   0  . furosemide (LASIX) 20 MG tablet Take 1 tablet by mouth daily.  5  . indomethacin (INDOCIN SR) 75 MG CR capsule Take 75 mg by mouth daily.     . nitroGLYCERIN (NITROLINGUAL) 0.4 MG/SPRAY spray Place 1 spray under the tongue every 5 (five) minutes as needed for chest pain. 4.9 g 3  . omeprazole (PRILOSEC) 20 MG capsule Take 20 mg by mouth daily.      . QUEtiapine (SEROQUEL) 300 MG tablet Take 300 mg by mouth at bedtime.      . quinapril (ACCUPRIL) 40 MG tablet Take 40 mg by mouth daily.    . temazepam (RESTORIL) 30 MG capsule Take 1 capsule by mouth at bedtime.  5   No current facility-administered medications for this visit.     Past Medical History  Diagnosis Date  . Mixed hyperlipidemia     pt states "they took me off q med they had me on for high cholesterol; my good  cholesterol is low" (09/13/2015)  . Essential hypertension   . Asthma   . Gout   . ED (erectile dysfunction)   . Bradycardia     Very limited Wenkebach on event recorder November 2010  . 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(HCC)   . CAD (coronary artery disease) 2006    Multivessel status post CABG in [place], patent LIMA to LAD and SVG to diagonal with 40% stenosis 2010  . OSA (obstructive sleep apnea)     patient denies, stated had a sleep study and he was told he did not have    . Atrial fibrillation [facility])     new November 2016  . Presence of permanent cardiac pacemaker   . Myocardial infarction (HCC) 2006  . Pneumonia 2015  . Pneumonia 1990's X 4 in one year    "double pneumonia"  . GERD (gastroesophageal reflux disease)   . History of stomach ulcers 1980's  . Bipolar 1 disorder (HCC)     "put me on this after I got out of drug rehab 11/18/2005 and couldn't sleep; added anxiety RX and have been sleeping fine since"    ROS:   All systems reviewed and negative except as noted in the HPI.   Past Surgical History  Procedure  Laterality Date  . Left heart catheterization with coronary/graft angiogram N/A 12/05/2014    Procedure: LEFT HEART CATHETERIZATION WITH Isabel Caprice;  Surgeon: Peter M Swaziland, MD;  Location: [facility] CATH LAB;  Service: Cardiovascular;  Laterality: N/A;  . Insert / replace / remove pacemaker  09/13/2015    Medtronic  . Back surgery    . Lumbar disc surgery  X 3    "ruptured disc"  . Maximum access (mas)posterior lumbar interbody fusion (plif) 1 level  ~ 2003    "L4-5; put rods & screws in"  . Coronary artery bypass graft  2006    "CABG X2, in [place]"  . Cardiac catheterization      "I've had quite a few; don't remember any stents" (09/13/2015)  . Leg surgery Left 1961    "had 5 boils come up on my leg; they had to cut them out and put a drainage tube in there"  . Coronary angioplasty    . Ep implantable device N/A 09/13/2015    Procedure:  Pacemaker Implant;  Surgeon: Hillis Range, MD;  Location[facility] INVASIVE CV LAB;  Service: Cardiovascular;  Laterality: N/A;     Family History  Problem Relation Age of Onset  . COPD Mother   . Lung cancer Mother   . Stroke Mother   . Heart attack Mother   . Heart disease Father   . Heart attack Father   . Hyperlipidemia Brother   . Hypertension Brother   . Hypertension Brother   . Hypertension Sister      Social History   Social History  . Marital Status: Married    Spouse Name: N/A  . Number of Children: N/A  . Years of Education: N/A   Occupational History  . Not on file.   Social History Main Topics  . Smoking status: Former Smoker -- 2.00 packs/day for 17 years    Types: Cigarettes    Quit date: 07/17/1986  . Smokeless tobacco: Current User    Types: Snuff     Comment: 09/13/2015 "quit chewing tobacco August 2016"  . Alcohol Use: 0.0 oz/week    0 Standard drinks or equivalent per week     Comment: 09/13/2015 "got out of alcohol rehab 11/18/2005"  . Drug Use: Yes     Comment: 09/13/2015 "got out of rehab for pills 11/18/2005"  . Sexual Activity: Yes    Birth Control/ Protection: None   Other Topics Concern  . 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      BP 130/98 mmHg  Pulse 80  Ht 5' 7.5" (1.715 m)  Wt 219 lb (99.338 kg)  BMI 33.77 kg/m2  Physical Exam:  Well appearing 60 year old man, NAD HEENT: Unremarkable Neck:  6 cm JVD, no thyromegally Lymphatics:  No adenopathy Back:  No CVA tenderness Lungs:  Clear, with no wheezes, rales, or rhonchi. Well healed PPM incision. HEART:  Regular rate rhythm, no murmurs, no rubs, no clicks Abd:  soft, positive bowel sounds, no organomegally, no rebound, no guarding Ext:  2 plus pulses, no edema, no cyanosis, no clubbing Skin:  No rashes no nodules Neuro:  CN II through XII intact, motor grossly intact  EKG - NSR with first degree AV block  A/P 1.  High grade heart block - he is now s/p PPM and has underlying conduction today. He is pacing about 30% of the time. 2. PAF - he is in NSR 99% of the  time. I considered starting an AA drug but will hold off until he becomes more symptomatic.  3. HTN - his blood pressure is controlled. He will continue his current meds. 4. CAD - he is s/p CABG and appears to be doing well. Will follow.  Khrystina Bonnes,M.D.  Assess/Plan:

## 2016-03-28 NOTE — Patient Instructions (Addendum)
Medication Instructions:  Your physician has recommended you make the following change in your medication:  1) Take Metoprolol 25 mg as needed    Labwork: None ordered   Testing/Procedures: None ordered   Follow-Up: Your physician wants you to follow-up in: January 2018 with Dr Court Joy will receive a reminder letter in the mail two months in advance. If you don't receive a letter, please call our office to schedule the follow-up appointment.  Remote monitoring is used to monitor your Pacemaker from home. This monitoring reduces the number of office visits required to check your device to one time per year. It allows [place] to keep an eye on the functioning of your device to ensure it is working properly. You are scheduled for a device check from home on 06/27/16. You may send your transmission at any time that day. If you have a wireless device, the transmission will be sent automatically. After your physician reviews your transmission, you will receive a postcard with your next transmission date.     Any Other Special Instructions Will Be Listed Below (If Applicable).     If you need a refill on your cardiac medications before your next appointment, please call your pharmacy.

## 2016-04-24 ENCOUNTER — Ambulatory Visit: Payer: BLUE CROSS/BLUE SHIELD | Admitting: Nurse Practitioner

## 2016-06-27 ENCOUNTER — Ambulatory Visit (INDEPENDENT_AMBULATORY_CARE_PROVIDER_SITE_OTHER): Payer: BLUE CROSS/BLUE SHIELD | Admitting: *Deleted

## 2016-06-27 ENCOUNTER — Telehealth: Payer: Self-pay | Admitting: Cardiology

## 2016-06-27 DIAGNOSIS — I495 Sick sinus syndrome: Secondary | ICD-10-CM | POA: Diagnosis not present

## 2016-06-27 NOTE — Telephone Encounter (Signed)
LMOVM reminding pt to send remote transmission.   

## 2016-06-28 ENCOUNTER — Encounter: Payer: Self-pay | Admitting: Cardiology

## 2016-06-28 NOTE — Progress Notes (Signed)
Remote pacemaker transmission.   

## 2016-07-29 LAB — CUP PACEART REMOTE DEVICE CHECK
Battery Remaining Longevity: 102 mo
Battery Voltage: 3.02 V
Brady Statistic AP VP Percent: 2.75 %
Brady Statistic AP VS Percent: 1.41 %
Brady Statistic AS VS Percent: 66.06 %
Implantable Lead Implant Date: 20161228
Implantable Lead Model: 5076
Implantable Pulse Generator Implant Date: 20161228
Lead Channel Impedance Value: 342 Ohm
Lead Channel Impedance Value: 475 Ohm
Lead Channel Pacing Threshold Amplitude: 0.5 V
Lead Channel Pacing Threshold Amplitude: 1 V
Lead Channel Pacing Threshold Pulse Width: 0.4 ms
Lead Channel Sensing Intrinsic Amplitude: 13.875 mV
Lead Channel Sensing Intrinsic Amplitude: 2.875 mV
Lead Channel Sensing Intrinsic Amplitude: 2.875 mV
MDC IDC LEAD IMPLANT DT: 20161228
MDC IDC LEAD LOCATION: 753859
MDC IDC LEAD LOCATION: 753860
MDC IDC MSMT LEADCHNL RA PACING THRESHOLD PULSEWIDTH: 0.4 ms
MDC IDC MSMT LEADCHNL RV IMPEDANCE VALUE: 437 Ohm
MDC IDC MSMT LEADCHNL RV IMPEDANCE VALUE: 513 Ohm
MDC IDC MSMT LEADCHNL RV SENSING INTR AMPL: 13.875 mV
MDC IDC SESS DTM: 20171012210039
MDC IDC SET LEADCHNL RA PACING AMPLITUDE: 2.5 V
MDC IDC SET LEADCHNL RV PACING AMPLITUDE: 2.5 V
MDC IDC SET LEADCHNL RV PACING PULSEWIDTH: 0.4 ms
MDC IDC SET LEADCHNL RV SENSING SENSITIVITY: 2.8 mV
MDC IDC STAT BRADY AS VP PERCENT: 29.78 %
MDC IDC STAT BRADY RA PERCENT PACED: 4.16 %
MDC IDC STAT BRADY RV PERCENT PACED: 32.53 %

## 2016-07-29 NOTE — Progress Notes (Signed)
Normal remote reviewed.  Due GT 09/2016

## 2016-09-06 ENCOUNTER — Other Ambulatory Visit: Payer: Self-pay | Admitting: Internal Medicine

## 2016-09-06 DIAGNOSIS — I495 Sick sinus syndrome: Secondary | ICD-10-CM

## 2016-10-04 ENCOUNTER — Other Ambulatory Visit: Payer: Self-pay | Admitting: Nurse Practitioner

## 2016-11-06 ENCOUNTER — Other Ambulatory Visit: Payer: Self-pay | Admitting: Nurse Practitioner

## 2016-11-06 NOTE — Telephone Encounter (Signed)
Pt will need labs done when he has his Cardiology follow-up with Dr. Ladona Ridgel in April. Sent in enough medication until pt has his appt in April & made a note on appt that he will need labs at the Cardiology Appt.

## 2016-12-10 ENCOUNTER — Other Ambulatory Visit (HOSPITAL_COMMUNITY): Payer: Self-pay | Admitting: Neurosurgery

## 2016-12-10 DIAGNOSIS — M5441 Lumbago with sciatica, right side: Secondary | ICD-10-CM

## 2016-12-19 ENCOUNTER — Ambulatory Visit (INDEPENDENT_AMBULATORY_CARE_PROVIDER_SITE_OTHER): Payer: BLUE CROSS/BLUE SHIELD | Admitting: Internal Medicine

## 2016-12-19 ENCOUNTER — Encounter: Payer: Self-pay | Admitting: Internal Medicine

## 2016-12-19 VITALS — BP 140/100 | HR 103 | Ht 67.0 in | Wt 215.0 lb

## 2016-12-19 DIAGNOSIS — I442 Atrioventricular block, complete: Secondary | ICD-10-CM

## 2016-12-19 DIAGNOSIS — I495 Sick sinus syndrome: Secondary | ICD-10-CM

## 2016-12-19 LAB — CUP PACEART INCLINIC DEVICE CHECK
Battery Remaining Longevity: 101 mo
Battery Voltage: 3.02 V
Brady Statistic AP VS Percent: 1.27 %
Brady Statistic AS VS Percent: 56 %
Implantable Lead Location: 753860
Implantable Lead Model: 5076
Implantable Pulse Generator Implant Date: 20161228
Lead Channel Impedance Value: 304 Ohm
Lead Channel Impedance Value: 437 Ohm
Lead Channel Impedance Value: 456 Ohm
Lead Channel Pacing Threshold Amplitude: 0.5 V
Lead Channel Pacing Threshold Pulse Width: 0.4 ms
Lead Channel Sensing Intrinsic Amplitude: 19.125 mV
Lead Channel Sensing Intrinsic Amplitude: 2.875 mV
Lead Channel Setting Pacing Amplitude: 2.5 V
Lead Channel Setting Pacing Pulse Width: 0.4 ms
MDC IDC LEAD IMPLANT DT: 20161228
MDC IDC LEAD IMPLANT DT: 20161228
MDC IDC LEAD LOCATION: 753859
MDC IDC MSMT LEADCHNL RA PACING THRESHOLD AMPLITUDE: 1 V
MDC IDC MSMT LEADCHNL RA PACING THRESHOLD PULSEWIDTH: 0.4 ms
MDC IDC MSMT LEADCHNL RV IMPEDANCE VALUE: 380 Ohm
MDC IDC SESS DTM: 20180405172622
MDC IDC SET LEADCHNL RA PACING AMPLITUDE: 2.25 V
MDC IDC SET LEADCHNL RV SENSING SENSITIVITY: 2.8 mV
MDC IDC STAT BRADY AP VP PERCENT: 5.07 %
MDC IDC STAT BRADY AS VP PERCENT: 37.65 %
MDC IDC STAT BRADY RA PERCENT PACED: 6.26 %
MDC IDC STAT BRADY RV PERCENT PACED: 42.03 %

## 2016-12-19 MED ORDER — NITROGLYCERIN 0.4 MG/SPRAY TL SOLN: 1.0000 | 3 refills | Status: DC | PRN

## 2016-12-19 NOTE — Progress Notes (Signed)
HPI [name] [name] returns today s/p permanent pacemaker insertion. He is a 61 year old man with known coronary artery disease, status post bypass surgery, hypertension, and gout. The patient has had bradycardia in the past. He has never had frank syncope. He has not had syncope. He denies chest pain. He has developed worsening back pain and is pending MRI of the spine. He has occaisional palpitations.  Allergies  Allergen Reactions  . Sulfonamide Derivatives Hives     Current Outpatient Prescriptions  Medication Sig Dispense Refill  . allopurinol (ZYLOPRIM) 300 MG tablet Take 300 mg by mouth daily.      . beta carotene w/minerals (OCUVITE) tablet Take 1 tablet by mouth daily.    . cetirizine (ZYRTEC ALLERGY) 10 MG tablet Take 10 mg by mouth daily.      [name] COLCRYS 0.6 MG tablet Take 0.6 mg by mouth daily as needed (Gout).   0  . ELIQUIS 5 MG TABS tablet TAKE 1 TABLET (5 MG TOTAL) BY MOUTH 2 TIMES DAILY. RESUME ON 09/18/15 60 tablet 2  . furosemide (LASIX) 20 MG tablet Take 1 tablet by mouth daily as needed (swelling).   5  . indomethacin (INDOCIN SR) 75 MG CR capsule Take 75 mg by mouth daily.     . metoprolol tartrate (LOPRESSOR) 25 MG tablet TAKE 1 TABLET (25 MG TOTAL) BY MOUTH DAILY AS NEEDED (HEART RATE). 45 tablet 0  . nitroGLYCERIN (NITROLINGUAL) 0.4 MG/SPRAY spray Place 1 spray under the tongue every 5 (five) minutes as needed for chest pain. 4.9 g 3  . omeprazole (PRILOSEC) 20 MG capsule Take 20 mg by mouth daily.      . QUEtiapine (SEROQUEL) 300 MG tablet Take 300 mg by mouth at bedtime.      . quinapril (ACCUPRIL) 40 MG tablet Take 40 mg by mouth daily.    . temazepam (RESTORIL) 30 MG capsule Take 1 capsule by mouth at bedtime.  5   No current facility-administered medications for this visit.      Past Medical History:  Diagnosis Date  . Asthma   . Atrial fibrillation [facility])    new November 2016  . Bipolar 1 disorder (HCC)    "put me on this after I got out of drug  rehab 11/18/2005 and couldn't sleep; added anxiety RX and have been sleeping fine since"  . Bradycardia    Very limited Wenkebach on event recorder November 2010  . CAD (coronary artery disease) 2006   Multivessel status post CABG in [place], patent LIMA to LAD and SVG to diagonal with 40% stenosis 2010  . ED (erectile dysfunction)   . Essential hypertension   . GERD (gastroesophageal reflux disease)   . Gout   . History of stomach ulcers 1980's  . Mixed hyperlipidemia    pt states "they took me off q med they had me on for high cholesterol; my good cholesterol is low" (09/13/2015)  . Myocardial infarction 2006  . OSA (obstructive sleep apnea)    patient denies, stated had a sleep study and he was told he did not have    . Pneumonia 2015  . Pneumonia 1990's X 4 in one year   "double pneumonia"  . Presence of permanent cardiac pacemaker   . 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(HCC)     ROS:   All systems reviewed and negative except as noted in the HPI.   Past Surgical History:  Procedure Laterality Date  . BACK SURGERY    . CARDIAC  CATHETERIZATION     "I've had quite a few; don't remember any stents" (09/13/2015)  . CORONARY ANGIOPLASTY    . CORONARY ARTERY BYPASS GRAFT  2006   "CABG X2, in [place]"  . EP IMPLANTABLE DEVICE N/A 09/13/2015   Procedure: Pacemaker Implant;  Surgeon: Hillis Range, MD;  Location: [facility] INVASIVE CV LAB;  Service: Cardiovascular;  Laterality: N/A;  . INSERT / REPLACE / REMOVE PACEMAKER  09/13/2015   Medtronic  . LEFT HEART CATHETERIZATION WITH CORONARY/GRAFT ANGIOGRAM N/A 12/05/2014   Procedure: LEFT HEART CATHETERIZATION WITH Isabel Caprice;  Surgeon: Peter M Swaziland, MD;  Location: [facility] CATH LAB;  Service: Cardiovascular;  Laterality: N/A;  . LEG SURGERY Left 1961   "had 5 boils come up on my leg; they had to cut them out and put a drainage tube in there"  . LUMBAR DISC SURGERY  X 3   "ruptured disc"  . MAXIMUM ACCESS (MAS)POSTERIOR LUMBAR  INTERBODY FUSION (PLIF) 1 LEVEL  ~ 2003   "L4-5; put rods & screws in"     Family History  Problem Relation Age of Onset  . COPD Mother   . Lung cancer Mother   . Stroke Mother   . Heart attack Mother   . Heart disease Father   . Heart attack Father   . Hyperlipidemia Brother   . Hypertension Brother   . Hypertension Brother   . Hypertension Sister      Social History   Social History  . Marital status: Married    Spouse name: N/A  . Number of children: N/A  . Years of education: N/A   Occupational History  . Not on file.   Social History Main Topics  . Smoking status: Former Smoker    Packs/day: 2.00    Years: 17.00    Types: Cigarettes    Quit date: 07/17/1986  . Smokeless tobacco: Current User    Types: Snuff     Comment: 09/13/2015 "quit chewing tobacco August 2016"  . Alcohol use 0.0 oz/week     Comment: 09/13/2015 "got out of alcohol rehab 11/18/2005"  . Drug use: Yes     Comment: 09/13/2015 "got out of rehab for pills 11/18/2005"  . Sexual activity: Yes    Birth control/ protection: None   Other Topics Concern  . 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      BP (!) 140/100   Pulse (!) 103   Ht 5\' 7"  (1.702 m)   Wt 215 lb (97.5 kg)   SpO2 96%   BMI 33.67 kg/m   Physical Exam:  Well appearing 61 year old man, NAD HEENT: Unremarkable Neck:  6 cm JVD, no thyromegally Lymphatics:  No adenopathy Back:  No CVA tenderness Lungs:  Clear, with no wheezes, rales, or rhonchi. Well healed PPM incision. HEART:  Regular rate rhythm, no murmurs, no rubs, no clicks Abd:  soft, positive bowel sounds, no organomegally, no rebound, no guarding Ext:  2 plus pulses, no edema, no cyanosis, no clubbing Skin:  No rashes no nodules Neuro:  CN II through XII intact, motor grossly intact  PPM eval - normal MRI compatible Medtronic DDD PM function  A/P 1. High grade heart block - he is now s/p PPM and has  underlying conduction today. He is pacing about 40% of the time. 2. PAF - he is in NSR 99% of the time. I considered starting an AA drug but will hold off until he  becomes more symptomatic.  3. HTN - his blood pressure is not well controlled. He will continue his current meds, though I considered uptitration of his beta blocker we will wait until after his back evaluation 4. CAD - he is s/p CABG and appears to be doing well with no chest pain. Will follow.  Leonia Reeves.D.

## 2016-12-19 NOTE — Patient Instructions (Addendum)
Medication Instructions:  Your physician recommends that you continue on your current medications as directed. Please refer to the Current Medication list given to you today.   Labwork: Lab work to be done today--BMP, CBC  Testing/Procedures: None  Follow-Up:  Your physician wants you to follow-up in about 3 months.     Any Other Special Instructions Will Be Listed Below (If Applicable).     If you need a refill on your cardiac medications before your next appointment, please call your pharmacy.

## 2016-12-20 LAB — CBC WITH DIFFERENTIAL/PLATELET
BASOS ABS: 0.1 10*3/uL (ref 0.0–0.2)
Basos: 2 %
EOS (ABSOLUTE): 0.1 10*3/uL (ref 0.0–0.4)
Eos: 1 %
Hematocrit: 45.2 % (ref 37.5–51.0)
Hemoglobin: 15.7 g/dL (ref 13.0–17.7)
IMMATURE GRANS (ABS): 0 10*3/uL (ref 0.0–0.1)
Immature Granulocytes: 0 %
LYMPHS: 20 %
Lymphocytes Absolute: 1.4 10*3/uL (ref 0.7–3.1)
MCH: 30.3 pg (ref 26.6–33.0)
MCHC: 34.7 g/dL (ref 31.5–35.7)
MCV: 87 fL (ref 79–97)
Monocytes Absolute: 0.5 10*3/uL (ref 0.1–0.9)
Monocytes: 7 %
NEUTROS ABS: 5 10*3/uL (ref 1.4–7.0)
Neutrophils: 70 %
Platelets: 284 10*3/uL (ref 150–379)
RBC: 5.19 x10E6/uL (ref 4.14–5.80)
RDW: 14.3 % (ref 12.3–15.4)
WBC: 7.2 10*3/uL (ref 3.4–10.8)

## 2016-12-20 LAB — BASIC METABOLIC PANEL
BUN/Creatinine Ratio: 17 (ref 9–20)
BUN: 17 mg/dL (ref 6–24)
CALCIUM: 9.7 mg/dL (ref 8.7–10.2)
CHLORIDE: 106 mmol/L (ref 96–106)
CO2: 26 mmol/L (ref 18–29)
Creatinine, Ser: 1.03 mg/dL (ref 0.76–1.27)
GFR calc non Af Amer: 80 mL/min/{1.73_m2} (ref >59)
GFR, EST AFRICAN AMERICAN: 92 mL/min/{1.73_m2} (ref >59)
GLUCOSE: 96 mg/dL (ref 65–99)
POTASSIUM: 4.5 mmol/L (ref 3.5–5.2)
Sodium: 146 mmol/L — ABNORMAL HIGH (ref 134–144)

## 2016-12-24 ENCOUNTER — Ambulatory Visit (HOSPITAL_COMMUNITY)
Admission: RE | Admit: 2016-12-24 | Discharge: 2016-12-24 | Disposition: A | Discharge status: Home / Self Care | Payer: BLUE CROSS/BLUE SHIELD | Source: Ambulatory Visit | Attending: Neurosurgery | Admitting: Neurosurgery

## 2016-12-24 DIAGNOSIS — M5136 Other intervertebral disc degeneration, lumbar region: Secondary | ICD-10-CM | POA: Diagnosis not present

## 2016-12-24 DIAGNOSIS — Z981 Arthrodesis status: Secondary | ICD-10-CM | POA: Diagnosis not present

## 2016-12-24 DIAGNOSIS — M5126 Other intervertebral disc displacement, lumbar region: Secondary | ICD-10-CM | POA: Diagnosis not present

## 2016-12-24 DIAGNOSIS — M5441 Lumbago with sciatica, right side: Secondary | ICD-10-CM | POA: Diagnosis present

## 2016-12-24 MED ORDER — GADOBENATE DIMEGLUMINE 529 MG/ML IV SOLN
20.0000 mL | Freq: Once | INTRAVENOUS | Status: AC
  Administered 2016-12-24: 20 mL via INTRAVENOUS

## 2017-01-07 ENCOUNTER — Other Ambulatory Visit: Payer: Self-pay | Admitting: Internal Medicine

## 2017-01-07 NOTE — Telephone Encounter (Signed)
Request received for Eliquis 5mg ; pt is 61 yrs old, 97.5kg on 12/19/16, Crea-1.03 on 12/19/16, & last seen by Dr. Ladona Ridgel on 12/19/16. Will send in requested refill to Pharmacy.

## 2017-02-12 ENCOUNTER — Other Ambulatory Visit: Payer: Self-pay | Admitting: Internal Medicine

## 2017-02-12 DIAGNOSIS — I495 Sick sinus syndrome: Secondary | ICD-10-CM

## 2017-03-13 ENCOUNTER — Encounter: Payer: Self-pay | Admitting: Internal Medicine

## 2017-03-13 ENCOUNTER — Ambulatory Visit (INDEPENDENT_AMBULATORY_CARE_PROVIDER_SITE_OTHER): Payer: BLUE CROSS/BLUE SHIELD | Admitting: Internal Medicine

## 2017-03-13 VITALS — BP 106/74 | HR 73 | Ht 67.0 in | Wt 208.8 lb

## 2017-03-13 DIAGNOSIS — I442 Atrioventricular block, complete: Secondary | ICD-10-CM

## 2017-03-13 DIAGNOSIS — I495 Sick sinus syndrome: Secondary | ICD-10-CM

## 2017-03-13 DIAGNOSIS — Z950 Presence of cardiac pacemaker: Secondary | ICD-10-CM | POA: Diagnosis not present

## 2017-03-13 MED ORDER — METOPROLOL TARTRATE 25 MG PO TABS: 25.0000 mg | ORAL_TABLET | Freq: Every day | ORAL | 6 refills | Status: DC | PRN

## 2017-03-13 NOTE — Patient Instructions (Signed)
Medication Instructions:  Your physician recommends that you continue on your current medications as directed. Please refer to the Current Medication list given to you today.   Labwork: None Ordered   Testing/Procedures: None Ordered   Follow-Up: Your physician wants you to follow-up in: 1 year with Dr. Taylor. You will receive a reminder letter in the mail two months in advance. If you don't receive a letter, please call our office to schedule the follow-up appointment.  Remote monitoring is used to monitor your Pacemaker from home. This monitoring reduces the number of office visits required to check your device to one time per year. It allows us to keep an eye on the functioning of your device to ensure it is working properly. You are scheduled for a device check from home on  06/12/17 . You may send your transmission at any time that day. If you have a wireless device, the transmission will be sent automatically. After your physician reviews your transmission, you will receive a postcard with your next transmission date.    Any Other Special Instructions Will Be Listed Below (If Applicable).     If you need a refill on your cardiac medications before your next appointment, please call your pharmacy.   

## 2017-03-13 NOTE — Progress Notes (Signed)
HPI [name] [name] returns today s/p permanent pacemaker insertion. He is a 61 year old man with known coronary artery disease, status post bypass surgery, hypertension, and gout.When I saw him last he was experiencing lower back pain. He has returned to work and his back pain is improved. No other complaints. He has minimal palpitations. When I saw him last I consider starting AA drug therapy but his CAD made our options fewer. Allergies  Allergen Reactions  . Sulfonamide Derivatives Hives     Current Outpatient Prescriptions  Medication Sig Dispense Refill  . allopurinol (ZYLOPRIM) 300 MG tablet Take 300 mg by mouth daily.      . beta carotene w/minerals (OCUVITE) tablet Take 1 tablet by mouth daily.    . cetirizine (ZYRTEC ALLERGY) 10 MG tablet Take 10 mg by mouth daily.      [name] COLCRYS 0.6 MG tablet Take 0.6 mg by mouth daily as needed (Gout).   0  . ELIQUIS 5 MG TABS tablet TAKE 1 TABLET (5 MG TOTAL) BY MOUTH 2 TIMES DAILY. 60 tablet 6  . furosemide (LASIX) 20 MG tablet Take 1 tablet by mouth daily as needed (swelling).   5  . indomethacin (INDOCIN SR) 75 MG CR capsule Take 75 mg by mouth daily.     . metoprolol tartrate (LOPRESSOR) 25 MG tablet Take 1 tablet (25 mg total) by mouth daily as needed (heart rate). 30 tablet 6  . nitroGLYCERIN (NITROLINGUAL) 0.4 MG/SPRAY spray Place 1 spray under the tongue every 5 (five) minutes as needed for chest pain. 4.9 g 3  . omeprazole (PRILOSEC) 20 MG capsule Take 20 mg by mouth daily.      . QUEtiapine (SEROQUEL) 300 MG tablet Take 300 mg by mouth at bedtime.      . quinapril (ACCUPRIL) 40 MG tablet Take 40 mg by mouth daily.    . temazepam (RESTORIL) 30 MG capsule Take 1 capsule by mouth at bedtime.  5   No current facility-administered medications for this visit.      Past Medical History:  Diagnosis Date  . Asthma   . Atrial fibrillation [facility])    new November 2016  . Bipolar 1 disorder (HCC)    "put me on this after I got out of  drug rehab 11/18/2005 and couldn't sleep; added anxiety RX and have been sleeping fine since"  . Bradycardia    Very limited Wenkebach on event recorder November 2010  . CAD (coronary artery disease) 2006   Multivessel status post CABG in [place], patent LIMA to LAD and SVG to diagonal with 40% stenosis 2010  . ED (erectile dysfunction)   . Essential hypertension   . GERD (gastroesophageal reflux disease)   . Gout   . History of stomach ulcers 1980's  . Mixed hyperlipidemia    pt states "they took me off q med they had me on for high cholesterol; my good cholesterol is low" (09/13/2015)  . Myocardial infarction (HCC) 2006  . OSA (obstructive sleep apnea)    patient denies, stated had a sleep study and he was told he did not have    . Pneumonia 2015  . Pneumonia 1990's X 4 in one year   "double pneumonia"  . Presence of permanent cardiac pacemaker   . 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(HCC)     ROS:   All systems reviewed and negative except as noted in the HPI.   Past Surgical History:  Procedure Laterality Date  . BACK SURGERY    .  CARDIAC CATHETERIZATION     "I've had quite a few; don't remember any stents" (09/13/2015)  . CORONARY ANGIOPLASTY    . CORONARY ARTERY BYPASS GRAFT  2006   "CABG X2, in [place]"  . EP IMPLANTABLE DEVICE N/A 09/13/2015   Procedure: Pacemaker Implant;  Surgeon: [name] Range, MD;  Location: [facility] INVASIVE CV LAB;  Service: Cardiovascular;  Laterality: N/A;  . INSERT / REPLACE / REMOVE PACEMAKER  09/13/2015   Medtronic  . LEFT HEART CATHETERIZATION WITH CORONARY/GRAFT ANGIOGRAM N/A 12/05/2014   Procedure: LEFT HEART CATHETERIZATION WITH Isabel Caprice;  Surgeon: Peter M Swaziland, MD;  Location: [facility] CATH LAB;  Service: Cardiovascular;  Laterality: N/A;  . LEG SURGERY Left 1961   "had 5 boils come up on my leg; they had to cut them out and put a drainage tube in there"  . LUMBAR DISC SURGERY  X 3   "ruptured disc"  . MAXIMUM ACCESS (MAS)POSTERIOR  LUMBAR INTERBODY FUSION (PLIF) 1 LEVEL  ~ 2003   "L4-5; put rods & screws in"     Family History  Problem Relation Age of Onset  . COPD Mother   . Lung cancer Mother   . Stroke Mother   . Heart attack Mother   . Heart disease Father   . Heart attack Father   . Hyperlipidemia Brother   . Hypertension Brother   . Hypertension Brother   . Hypertension Sister      Social History   Social History  . Marital status: Married    Spouse name: N/A  . Number of children: N/A  . Years of education: N/A   Occupational History  . Not on file.   Social History Main Topics  . Smoking status: Former Smoker    Packs/day: 2.00    Years: 17.00    Types: Cigarettes    Quit date: 03/13/2017  . Smokeless tobacco: Current User    Types: Snuff     Comment: 09/13/2015 "quit chewing tobacco August 2016"  . Alcohol use 0.0 oz/week     Comment: 09/13/2015 "got out of alcohol rehab 11/18/2005"  . Drug use: Yes     Comment: 09/13/2015 "got out of rehab for pills 11/18/2005"  . Sexual activity: Yes    Birth control/ protection: None   Other Topics Concern  . 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      BP 106/74   Pulse 73   Ht 5\' 7"  (1.702 m)   Wt 208 lb 12.8 oz (94.7 kg)   SpO2 97%   BMI 32.70 kg/m   Physical Exam:  Well appearing 61 year old man, NAD HEENT: Unremarkable Neck:  7 cm JVD, no thyromegally Lymphatics:  No adenopathy Back:  No CVA tenderness Lungs:  Clear, with no wheezes, rales, or rhonchi. Well healed PPM incision. HEART:  Regular rate rhythm, no murmurs, no rubs, no clicks Abd:  soft, positive bowel sounds, no organomegally, no rebound, no guarding Ext:  2 plus pulses, no edema, no cyanosis, no clubbing Skin:  No rashes no nodules Neuro:  CN II through XII intact, motor grossly intact  PPM eval - normal MRI compatible Medtronic DDD PM function  A/P 1. High grade heart block - he is now s/p PPM and  has underlying conduction today. He is pacing about 65% of the time. He feels well. 2. PAF - he is in NSR 98% of the time. I considered starting an AA drug but will  hold off until he becomes more symptomatic or his burden of atrial fib increases.  3. HTN - his blood pressure is not well controlled. He will continue his current meds, though I considered uptitration of his beta blocker we will wait until after his back evaluation 4. CAD - he is s/p CABG and appears to be doing well with no chest pain. Will follow.  Leonia Reeves.D.

## 2017-04-05 IMAGING — DX DG CHEST 2V
2 series · 2 of 2 positions shown · non-contrast
Comparison: 12/05/2014

CLINICAL DATA: Cardiac device in situ

EXAM:
CHEST  2 VIEW

[chest pa]
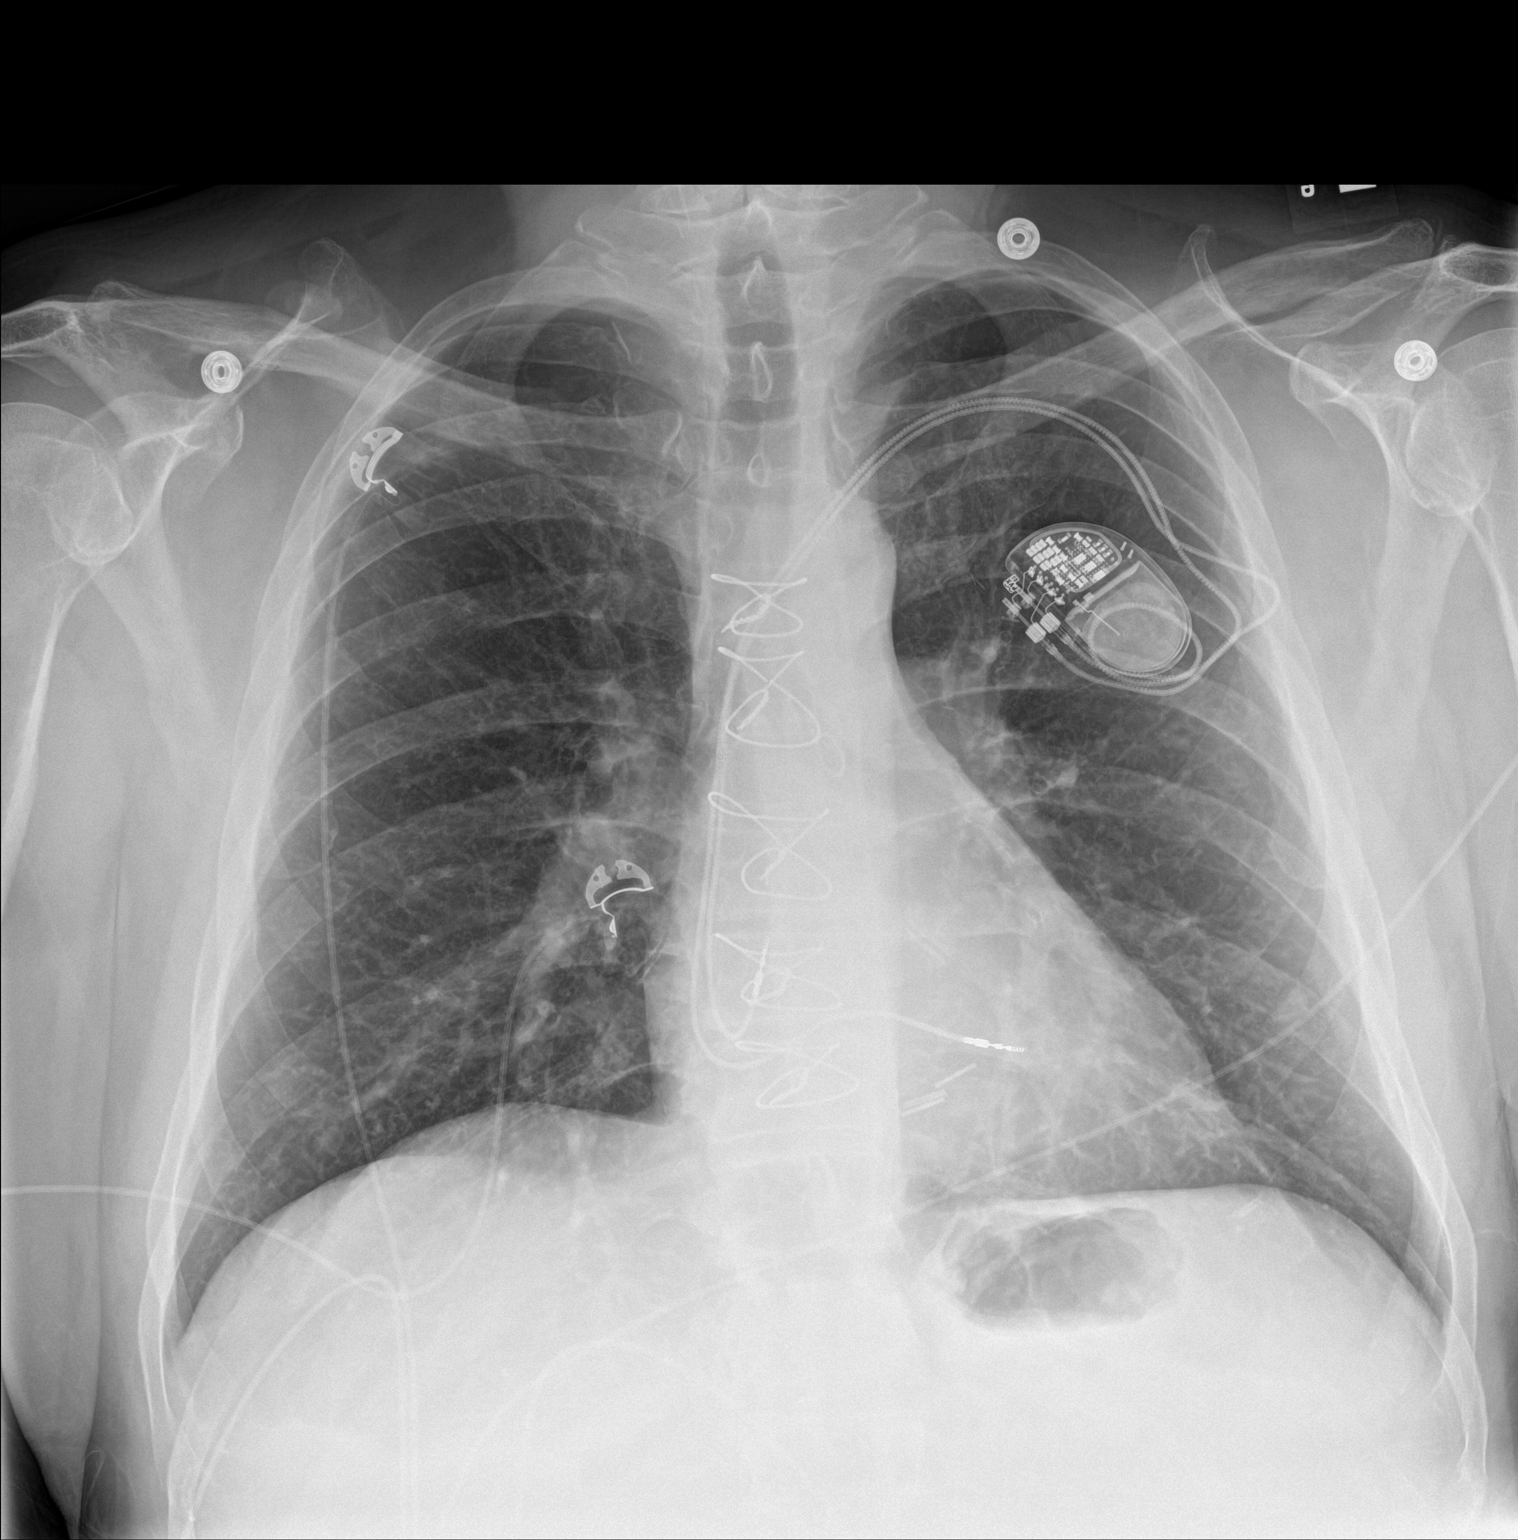

[chest lat]
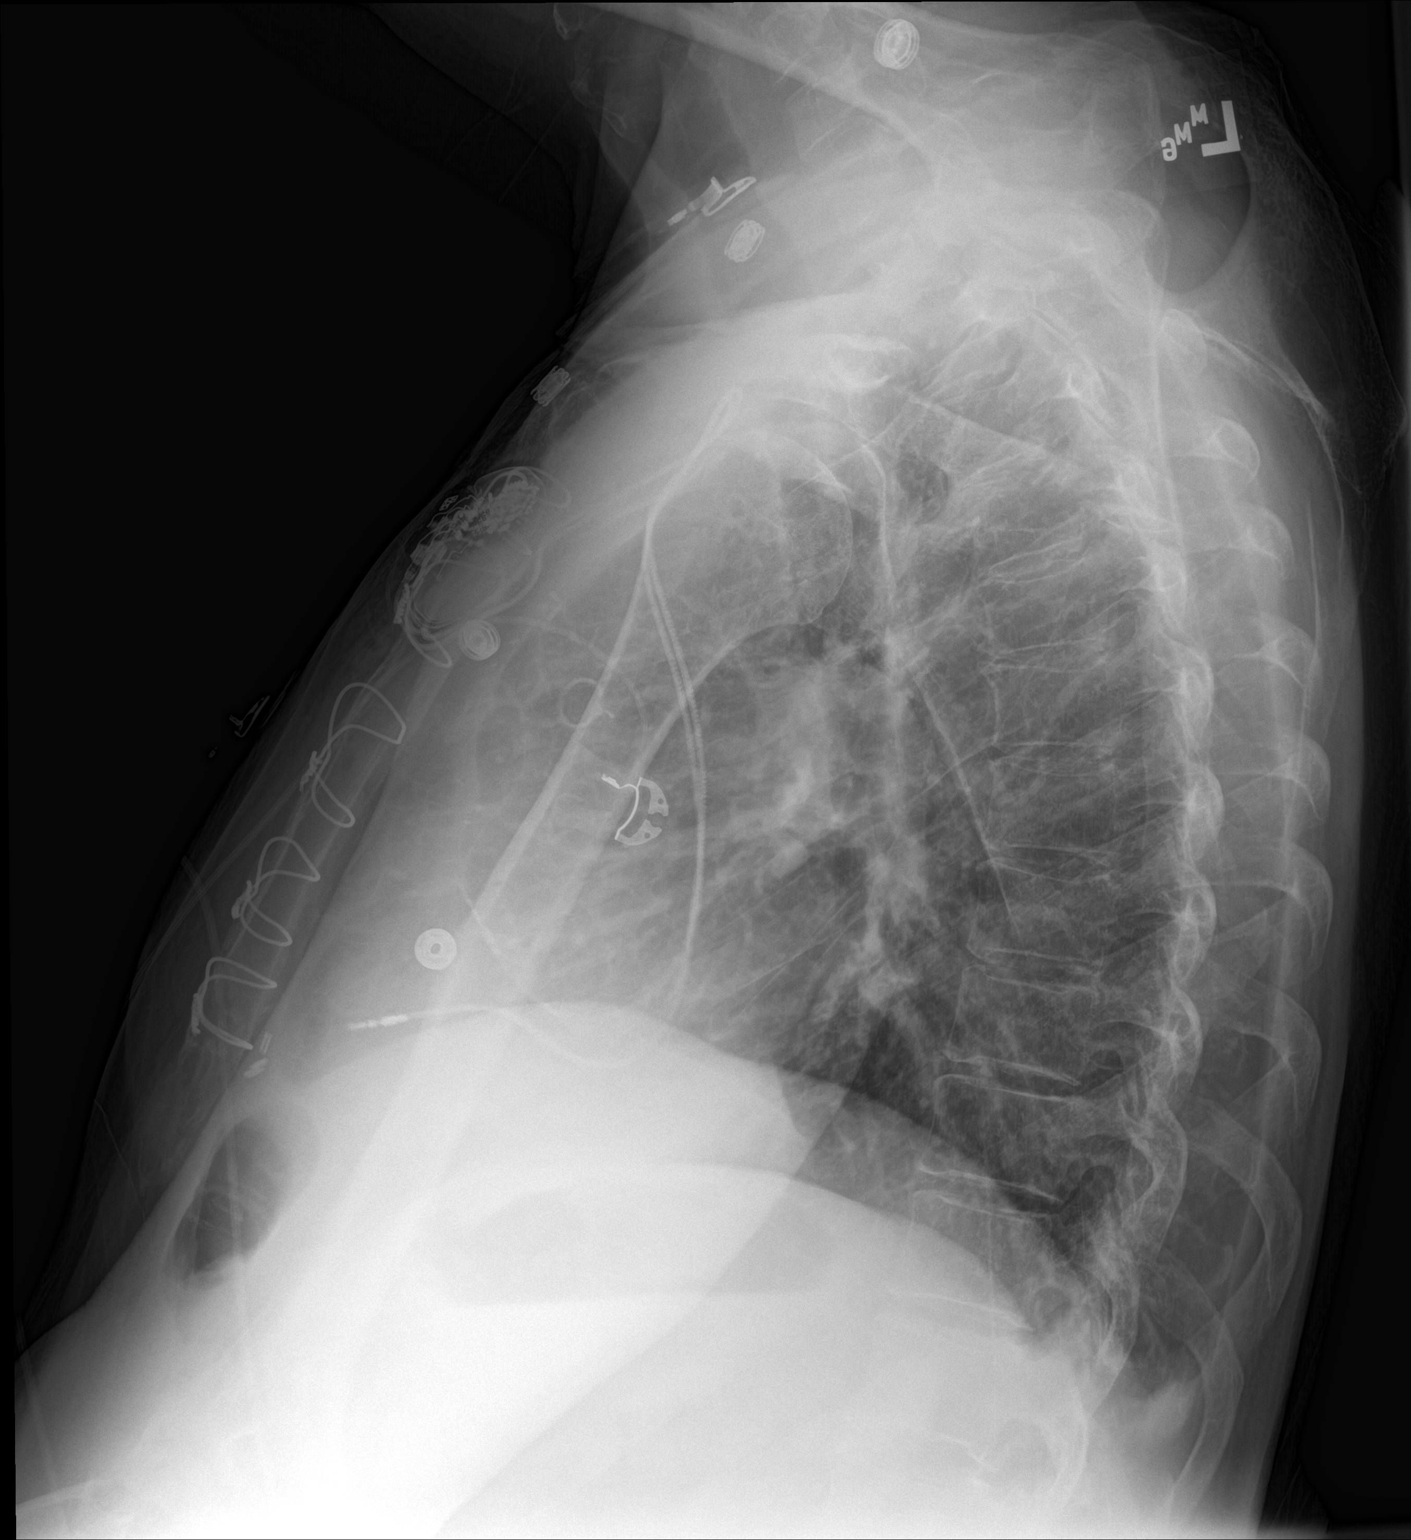

[2 of 2 positions shown; findings below may reference images not displayed]

FINDINGS: Interval placement of left pacer with leads in the right atrium and
right ventricle. No pneumothorax. Prior CABG. Heart and mediastinal
contours are within normal limits. No focal opacities or effusions.
No acute bony abnormality.
IMPRESSION: Left pacer placement without pneumothorax. No active cardiopulmonary
disease.

## 2017-06-12 ENCOUNTER — Telehealth: Payer: Self-pay | Admitting: Cardiology

## 2017-06-12 ENCOUNTER — Ambulatory Visit: Payer: BLUE CROSS/BLUE SHIELD | Admitting: *Deleted

## 2017-06-12 NOTE — Telephone Encounter (Signed)
LMOVM reminding pt to send remote transmission.   

## 2017-06-13 ENCOUNTER — Ambulatory Visit (INDEPENDENT_AMBULATORY_CARE_PROVIDER_SITE_OTHER): Payer: BLUE CROSS/BLUE SHIELD | Admitting: *Deleted

## 2017-06-13 ENCOUNTER — Encounter: Payer: Self-pay | Admitting: Cardiology

## 2017-06-13 DIAGNOSIS — I495 Sick sinus syndrome: Secondary | ICD-10-CM | POA: Diagnosis not present

## 2017-06-17 ENCOUNTER — Encounter: Payer: Self-pay | Admitting: Cardiology

## 2017-06-17 NOTE — Progress Notes (Signed)
Remote pacemaker transmission.   

## 2017-06-24 LAB — CUP PACEART REMOTE DEVICE CHECK
Battery Remaining Longevity: 98 mo
Battery Voltage: 3.02 V
Brady Statistic AP VS Percent: 0.57 %
Brady Statistic RA Percent Paced: 23.87 %
Brady Statistic RV Percent Paced: 38.01 %
Date Time Interrogation Session: 20180928155641
Implantable Lead Location: 753859
Implantable Lead Location: 753860
Implantable Lead Model: 5076
Implantable Pulse Generator Implant Date: 20161228
Lead Channel Impedance Value: 475 Ohm
Lead Channel Pacing Threshold Pulse Width: 0.4 ms
Lead Channel Sensing Intrinsic Amplitude: 11.875 mV
Lead Channel Sensing Intrinsic Amplitude: 2.5 mV
Lead Channel Setting Pacing Amplitude: 2.25 V
Lead Channel Setting Pacing Amplitude: 2.5 V
Lead Channel Setting Pacing Pulse Width: 0.4 ms
Lead Channel Setting Sensing Sensitivity: 2.8 mV
MDC IDC LEAD IMPLANT DT: 20161228
MDC IDC LEAD IMPLANT DT: 20161228
MDC IDC MSMT LEADCHNL RA IMPEDANCE VALUE: 342 Ohm
MDC IDC MSMT LEADCHNL RA IMPEDANCE VALUE: 456 Ohm
MDC IDC MSMT LEADCHNL RA PACING THRESHOLD AMPLITUDE: 1.125 V
MDC IDC MSMT LEADCHNL RA SENSING INTR AMPL: 2.5 mV
MDC IDC MSMT LEADCHNL RV IMPEDANCE VALUE: 399 Ohm
MDC IDC MSMT LEADCHNL RV PACING THRESHOLD AMPLITUDE: 0.625 V
MDC IDC MSMT LEADCHNL RV PACING THRESHOLD PULSEWIDTH: 0.4 ms
MDC IDC MSMT LEADCHNL RV SENSING INTR AMPL: 11.875 mV
MDC IDC STAT BRADY AP VP PERCENT: 47.23 %
MDC IDC STAT BRADY AS VP PERCENT: 25.11 %
MDC IDC STAT BRADY AS VS PERCENT: 27.09 %

## 2017-07-04 ENCOUNTER — Encounter: Payer: Self-pay | Admitting: Cardiology

## 2017-08-26 ENCOUNTER — Other Ambulatory Visit: Payer: Self-pay | Admitting: Internal Medicine

## 2017-09-15 ENCOUNTER — Telehealth: Payer: Self-pay | Admitting: Cardiology

## 2017-09-15 ENCOUNTER — Ambulatory Visit (INDEPENDENT_AMBULATORY_CARE_PROVIDER_SITE_OTHER): Payer: BLUE CROSS/BLUE SHIELD | Admitting: *Deleted

## 2017-09-15 DIAGNOSIS — I495 Sick sinus syndrome: Secondary | ICD-10-CM

## 2017-09-15 NOTE — Telephone Encounter (Signed)
LMOVM reminding pt to send remote transmission.   

## 2017-09-17 NOTE — Progress Notes (Signed)
Remote pacemaker transmission.   

## 2017-09-18 LAB — CUP PACEART REMOTE DEVICE CHECK
Brady Statistic AP VP Percent: 46.73 %
Brady Statistic AS VP Percent: 25.54 %
Brady Statistic RA Percent Paced: 26.42 %
Brady Statistic RV Percent Paced: 43.19 %
Date Time Interrogation Session: 20181231223902
Implantable Lead Implant Date: 20161228
Implantable Lead Location: 753859
Implantable Lead Location: 753860
Implantable Lead Model: 5076
Lead Channel Impedance Value: 399 Ohm
Lead Channel Impedance Value: 475 Ohm
Lead Channel Pacing Threshold Pulse Width: 0.4 ms
Lead Channel Sensing Intrinsic Amplitude: 13.5 mV
Lead Channel Sensing Intrinsic Amplitude: 13.5 mV
Lead Channel Setting Pacing Amplitude: 2.25 V
Lead Channel Setting Pacing Amplitude: 2.5 V
Lead Channel Setting Pacing Pulse Width: 0.4 ms
Lead Channel Setting Sensing Sensitivity: 2.8 mV
MDC IDC LEAD IMPLANT DT: 20161228
MDC IDC MSMT BATTERY REMAINING LONGEVITY: 95 mo
MDC IDC MSMT BATTERY VOLTAGE: 3.02 V
MDC IDC MSMT LEADCHNL RA IMPEDANCE VALUE: 342 Ohm
MDC IDC MSMT LEADCHNL RA IMPEDANCE VALUE: 532 Ohm
MDC IDC MSMT LEADCHNL RA PACING THRESHOLD AMPLITUDE: 1.125 V
MDC IDC MSMT LEADCHNL RA SENSING INTR AMPL: 2.375 mV
MDC IDC MSMT LEADCHNL RA SENSING INTR AMPL: 2.375 mV
MDC IDC MSMT LEADCHNL RV PACING THRESHOLD AMPLITUDE: 0.625 V
MDC IDC MSMT LEADCHNL RV PACING THRESHOLD PULSEWIDTH: 0.4 ms
MDC IDC PG IMPLANT DT: 20161228
MDC IDC STAT BRADY AP VS PERCENT: 0.52 %
MDC IDC STAT BRADY AS VS PERCENT: 27.21 %

## 2017-09-19 ENCOUNTER — Encounter: Payer: Self-pay | Admitting: Cardiology

## 2017-12-15 ENCOUNTER — Ambulatory Visit (INDEPENDENT_AMBULATORY_CARE_PROVIDER_SITE_OTHER): Payer: BLUE CROSS/BLUE SHIELD | Admitting: *Deleted

## 2017-12-15 ENCOUNTER — Telehealth: Payer: Self-pay | Admitting: Cardiology

## 2017-12-15 DIAGNOSIS — I495 Sick sinus syndrome: Secondary | ICD-10-CM

## 2017-12-15 NOTE — Telephone Encounter (Signed)
LMOVM reminding pt to send remote transmission.   

## 2017-12-16 ENCOUNTER — Encounter: Payer: Self-pay | Admitting: Cardiology

## 2017-12-16 NOTE — Progress Notes (Signed)
Remote pacemaker transmission.   

## 2017-12-17 LAB — CUP PACEART REMOTE DEVICE CHECK
Battery Voltage: 3.01 V
Brady Statistic AP VP Percent: 46.3 %
Brady Statistic RA Percent Paced: 23.71 %
Implantable Lead Implant Date: 20161228
Implantable Lead Location: 753860
Implantable Lead Model: 5076
Implantable Pulse Generator Implant Date: 20161228
Lead Channel Impedance Value: 304 Ohm
Lead Channel Impedance Value: 380 Ohm
Lead Channel Impedance Value: 437 Ohm
Lead Channel Pacing Threshold Amplitude: 1.125 V
Lead Channel Pacing Threshold Pulse Width: 0.4 ms
Lead Channel Pacing Threshold Pulse Width: 0.4 ms
Lead Channel Sensing Intrinsic Amplitude: 10.5 mV
Lead Channel Setting Pacing Amplitude: 2.25 V
Lead Channel Setting Pacing Pulse Width: 0.4 ms
Lead Channel Setting Sensing Sensitivity: 2.8 mV
MDC IDC LEAD IMPLANT DT: 20161228
MDC IDC LEAD LOCATION: 753859
MDC IDC MSMT BATTERY REMAINING LONGEVITY: 93 mo
MDC IDC MSMT LEADCHNL RA IMPEDANCE VALUE: 418 Ohm
MDC IDC MSMT LEADCHNL RA SENSING INTR AMPL: 1.75 mV
MDC IDC MSMT LEADCHNL RA SENSING INTR AMPL: 1.75 mV
MDC IDC MSMT LEADCHNL RV PACING THRESHOLD AMPLITUDE: 0.625 V
MDC IDC MSMT LEADCHNL RV SENSING INTR AMPL: 10.5 mV
MDC IDC SESS DTM: 20190401195151
MDC IDC SET LEADCHNL RV PACING AMPLITUDE: 2.5 V
MDC IDC STAT BRADY AP VS PERCENT: 0.37 %
MDC IDC STAT BRADY AS VP PERCENT: 24.77 %
MDC IDC STAT BRADY AS VS PERCENT: 28.56 %
MDC IDC STAT BRADY RV PERCENT PACED: 38.14 %

## 2018-03-16 ENCOUNTER — Telehealth: Payer: Self-pay | Admitting: Cardiology

## 2018-03-16 ENCOUNTER — Ambulatory Visit (INDEPENDENT_AMBULATORY_CARE_PROVIDER_SITE_OTHER): Payer: BLUE CROSS/BLUE SHIELD | Admitting: *Deleted

## 2018-03-16 DIAGNOSIS — I495 Sick sinus syndrome: Secondary | ICD-10-CM | POA: Diagnosis not present

## 2018-03-16 NOTE — Telephone Encounter (Signed)
LMOVM reminding pt to send remote transmission.   

## 2018-03-17 NOTE — Progress Notes (Signed)
Remote pacemaker transmission.   

## 2018-03-21 LAB — CUP PACEART REMOTE DEVICE CHECK
Battery Remaining Longevity: 86 mo
Battery Voltage: 3.02 V
Brady Statistic RA Percent Paced: 31.56 %
Date Time Interrogation Session: 20190701194243
Implantable Lead Implant Date: 20161228
Implantable Lead Location: 753860
Implantable Lead Model: 5076
Lead Channel Impedance Value: 342 Ohm
Lead Channel Pacing Threshold Amplitude: 1.125 V
Lead Channel Pacing Threshold Pulse Width: 0.4 ms
Lead Channel Pacing Threshold Pulse Width: 0.4 ms
Lead Channel Sensing Intrinsic Amplitude: 12.75 mV
Lead Channel Sensing Intrinsic Amplitude: 2.75 mV
Lead Channel Sensing Intrinsic Amplitude: 2.75 mV
Lead Channel Setting Pacing Pulse Width: 0.4 ms
MDC IDC LEAD IMPLANT DT: 20161228
MDC IDC LEAD LOCATION: 753859
MDC IDC MSMT LEADCHNL RA IMPEDANCE VALUE: 437 Ohm
MDC IDC MSMT LEADCHNL RV IMPEDANCE VALUE: 437 Ohm
MDC IDC MSMT LEADCHNL RV IMPEDANCE VALUE: 494 Ohm
MDC IDC MSMT LEADCHNL RV PACING THRESHOLD AMPLITUDE: 0.625 V
MDC IDC MSMT LEADCHNL RV SENSING INTR AMPL: 12.75 mV
MDC IDC PG IMPLANT DT: 20161228
MDC IDC SET LEADCHNL RA PACING AMPLITUDE: 2.25 V
MDC IDC SET LEADCHNL RV PACING AMPLITUDE: 2.5 V
MDC IDC SET LEADCHNL RV SENSING SENSITIVITY: 2.8 mV
MDC IDC STAT BRADY AP VP PERCENT: 51.39 %
MDC IDC STAT BRADY AP VS PERCENT: 0.38 %
MDC IDC STAT BRADY AS VP PERCENT: 27.46 %
MDC IDC STAT BRADY AS VS PERCENT: 20.77 %
MDC IDC STAT BRADY RV PERCENT PACED: 53 %

## 2018-05-09 ENCOUNTER — Other Ambulatory Visit: Payer: Self-pay | Admitting: Internal Medicine

## 2018-06-10 ENCOUNTER — Other Ambulatory Visit: Payer: Self-pay | Admitting: Internal Medicine

## 2018-06-11 NOTE — Telephone Encounter (Signed)
Pt last saw Dr Ladona Ridgel 03/13/17, pt is overdue for follow-up. Called pt scheduled follow-up appt with Dr Ladona Ridgel for 06/26/18.Last labs were on at [facility] on 05/06/18 Creat 1.06, age 62, weight 94.7kg, based on specified criteria pt is on appropriate dosage of Eliquis 5mg  BID.  Will refill rx.

## 2018-06-15 ENCOUNTER — Telehealth: Payer: Self-pay | Admitting: Cardiology

## 2018-06-15 ENCOUNTER — Ambulatory Visit: Payer: BLUE CROSS/BLUE SHIELD | Admitting: *Deleted

## 2018-06-15 NOTE — Telephone Encounter (Signed)
LMOVM reminding pt to send remote transmission.   

## 2018-06-17 ENCOUNTER — Ambulatory Visit (INDEPENDENT_AMBULATORY_CARE_PROVIDER_SITE_OTHER): Payer: BLUE CROSS/BLUE SHIELD | Admitting: *Deleted

## 2018-06-17 DIAGNOSIS — I495 Sick sinus syndrome: Secondary | ICD-10-CM

## 2018-06-18 NOTE — Progress Notes (Signed)
Remote pacemaker transmission.   

## 2018-06-23 LAB — CUP PACEART REMOTE DEVICE CHECK
Brady Statistic AP VP Percent: 53.09 %
Brady Statistic AP VS Percent: 0.48 %
Brady Statistic AS VP Percent: 28.48 %
Brady Statistic RA Percent Paced: 32.07 %
Brady Statistic RV Percent Paced: 53.54 %
Date Time Interrogation Session: 20191002131927
Implantable Lead Implant Date: 20161228
Implantable Lead Location: 753859
Implantable Lead Location: 753860
Implantable Lead Model: 5076
Lead Channel Impedance Value: 399 Ohm
Lead Channel Impedance Value: 456 Ohm
Lead Channel Pacing Threshold Pulse Width: 0.4 ms
Lead Channel Sensing Intrinsic Amplitude: 11.875 mV
Lead Channel Sensing Intrinsic Amplitude: 11.875 mV
Lead Channel Sensing Intrinsic Amplitude: 2.5 mV
Lead Channel Setting Pacing Amplitude: 2.25 V
Lead Channel Setting Pacing Amplitude: 2.5 V
Lead Channel Setting Pacing Pulse Width: 0.4 ms
MDC IDC LEAD IMPLANT DT: 20161228
MDC IDC MSMT BATTERY REMAINING LONGEVITY: 81 mo
MDC IDC MSMT BATTERY VOLTAGE: 3.01 V
MDC IDC MSMT LEADCHNL RA IMPEDANCE VALUE: 323 Ohm
MDC IDC MSMT LEADCHNL RA IMPEDANCE VALUE: 399 Ohm
MDC IDC MSMT LEADCHNL RA PACING THRESHOLD AMPLITUDE: 1.125 V
MDC IDC MSMT LEADCHNL RA SENSING INTR AMPL: 2.5 mV
MDC IDC MSMT LEADCHNL RV PACING THRESHOLD AMPLITUDE: 0.75 V
MDC IDC MSMT LEADCHNL RV PACING THRESHOLD PULSEWIDTH: 0.4 ms
MDC IDC PG IMPLANT DT: 20161228
MDC IDC SET LEADCHNL RV SENSING SENSITIVITY: 2.8 mV
MDC IDC STAT BRADY AS VS PERCENT: 17.95 %

## 2018-06-26 ENCOUNTER — Other Ambulatory Visit: Payer: Self-pay | Admitting: Internal Medicine

## 2018-06-26 ENCOUNTER — Encounter: Payer: Self-pay | Admitting: Internal Medicine

## 2018-06-26 ENCOUNTER — Ambulatory Visit: Payer: BLUE CROSS/BLUE SHIELD | Admitting: Internal Medicine

## 2018-06-26 VITALS — BP 128/80 | HR 63 | Ht 67.0 in | Wt 207.6 lb

## 2018-06-26 DIAGNOSIS — I480 Paroxysmal atrial fibrillation: Secondary | ICD-10-CM

## 2018-06-26 DIAGNOSIS — Z950 Presence of cardiac pacemaker: Secondary | ICD-10-CM

## 2018-06-26 DIAGNOSIS — I442 Atrioventricular block, complete: Secondary | ICD-10-CM | POA: Diagnosis not present

## 2018-06-26 DIAGNOSIS — Z951 Presence of aortocoronary bypass graft: Secondary | ICD-10-CM | POA: Diagnosis not present

## 2018-06-26 DIAGNOSIS — I495 Sick sinus syndrome: Secondary | ICD-10-CM

## 2018-06-26 MED ORDER — NITROGLYCERIN 0.4 MG/SPRAY TL SOLN: 1.0000 | 3 refills | Status: DC | PRN

## 2018-06-26 MED ORDER — METOPROLOL TARTRATE 25 MG PO TABS: 25.0000 mg | ORAL_TABLET | Freq: Every day | ORAL | 3 refills | Status: DC | PRN

## 2018-06-26 MED ORDER — QUINAPRIL HCL 40 MG PO TABS: 40.0000 mg | ORAL_TABLET | Freq: Every day | ORAL | 3 refills | Status: DC

## 2018-06-26 MED ORDER — FUROSEMIDE 20 MG PO TABS: 20.0000 mg | ORAL_TABLET | Freq: Every day | ORAL | 5 refills | Status: DC | PRN

## 2018-06-26 MED ORDER — APIXABAN 5 MG PO TABS: 5.0000 mg | ORAL_TABLET | Freq: Two times a day (BID) | ORAL | 11 refills | Status: DC

## 2018-06-26 NOTE — Progress Notes (Signed)
HPI Mr. Olgin returns today s/p permanent pacemaker insertion. He is a 62 year old man with known coronary artery disease, status post bypass surgery, hypertension, and gout. No other complaints. He has minimal palpitations. He is working overtime and feels well. He is pending a workup for his prostate. Allergies  Allergen Reactions  . Sulfonamide Derivatives Hives     Current Outpatient Medications  Medication Sig Dispense Refill  . allopurinol (ZYLOPRIM) 300 MG tablet Take 300 mg by mouth daily.      . beta carotene w/minerals (OCUVITE) tablet Take 1 tablet by mouth daily.    . cetirizine (ZYRTEC ALLERGY) 10 MG tablet Take 10 mg by mouth daily.      [name] COLCRYS 0.6 MG tablet Take 0.6 mg by mouth daily as needed (Gout).   0  . ELIQUIS 5 MG TABS tablet TAKE 1 TABLET(5 MG) BY MOUTH TWICE DAILY 60 tablet 0  . furosemide (LASIX) 20 MG tablet Take 1 tablet by mouth daily as needed (swelling).   5  . gabapentin (NEURONTIN) 300 MG capsule Take 1 capsule by mouth 2 (two) times daily.  5  . indomethacin (INDOCIN SR) 75 MG CR capsule Take 75 mg by mouth daily.     . metoprolol tartrate (LOPRESSOR) 25 MG tablet Take 1 tablet (25 mg total) by mouth daily as needed (heart rate). 30 tablet 6  . nitroGLYCERIN (NITROLINGUAL) 0.4 MG/SPRAY spray Place 1 spray under the tongue every 5 (five) minutes as needed for chest pain. 4.9 g 3  . omeprazole (PRILOSEC) 20 MG capsule Take 20 mg by mouth daily.      . QUEtiapine (SEROQUEL) 300 MG tablet Take 300 mg by mouth at bedtime.      . quinapril (ACCUPRIL) 40 MG tablet Take 40 mg by mouth daily.    . temazepam (RESTORIL) 30 MG capsule Take 1 capsule by mouth at bedtime.  5   No current facility-administered medications for this visit.      Past Medical History:  Diagnosis Date  . Asthma   . Atrial fibrillation [facility])    new November 2016  . Bipolar 1 disorder (HCC)    "put me on this after I got out of drug rehab 11/18/2005 and couldn't sleep;  added anxiety RX and have been sleeping fine since"  . Bradycardia    Very limited Wenkebach on event recorder November 2010  . CAD (coronary artery disease) 2006   Multivessel status post CABG in [place], patent LIMA to LAD and SVG to diagonal with 40% stenosis 2010  . ED (erectile dysfunction)   . Essential hypertension   . GERD (gastroesophageal reflux disease)   . Gout   . History of stomach ulcers 1980's  . Mixed hyperlipidemia    pt states "they took me off q med they had me on for high cholesterol; my good cholesterol is low" (09/13/2015)  . Myocardial infarction (HCC) 2006  . OSA (obstructive sleep apnea)    patient denies, stated had a sleep study and he was told he did not have    . Pneumonia 2015  . Pneumonia 1990's X 4 in one year   "double pneumonia"  . Presence of permanent cardiac pacemaker   . 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(HCC)     ROS:   All systems reviewed and negative except as noted in the HPI.   Past Surgical History:  Procedure Laterality Date  . BACK SURGERY    . CARDIAC CATHETERIZATION     "I've  had quite a few; don't remember any stents" (09/13/2015)  . CORONARY ANGIOPLASTY    . CORONARY ARTERY BYPASS GRAFT  2006   "CABG X2, in [place]"  . EP IMPLANTABLE DEVICE N/A 09/13/2015   Procedure: Pacemaker Implant;  Surgeon: Hillis Range, MD;  Location: [facility] INVASIVE CV LAB;  Service: Cardiovascular;  Laterality: N/A;  . INSERT / REPLACE / REMOVE PACEMAKER  09/13/2015   Medtronic  . LEFT HEART CATHETERIZATION WITH CORONARY/GRAFT ANGIOGRAM N/A 12/05/2014   Procedure: LEFT HEART CATHETERIZATION WITH Isabel Caprice;  Surgeon: Peter M Swaziland, MD;  Location: [facility] CATH LAB;  Service: Cardiovascular;  Laterality: N/A;  . LEG SURGERY Left 1961   "had 5 boils come up on my leg; they had to cut them out and put a drainage tube in there"  . LUMBAR DISC SURGERY  X 3   "ruptured disc"  . MAXIMUM ACCESS (MAS)POSTERIOR LUMBAR INTERBODY FUSION (PLIF) 1 LEVEL   ~ 2003   "L4-5; put rods & screws in"     Family History  Problem Relation Age of Onset  . COPD Mother   . Lung cancer Mother   . Stroke Mother   . Heart attack Mother   . Heart disease Father   . Heart attack Father   . Hyperlipidemia Brother   . Hypertension Brother   . Hypertension Brother   . Hypertension Sister      Social History   Socioeconomic History  . Marital status: Married    Spouse name: Not on file  . Number of children: Not on file  . Years of education: Not on file  . Highest education level: Not on file  Occupational History  . Not on file  Social Needs  . Financial resource strain: Not on file  . Food insecurity:    Worry: Not on file    Inability: Not on file  . Transportation needs:    Medical: Not on file    Non-medical: Not on file  Tobacco Use  . Smoking status: Former Smoker    Packs/day: 2.00    Years: 17.00    Pack years: 34.00    Types: Cigarettes    Last attempt to quit: 03/13/2017    Years since quitting: 1.2  . Smokeless tobacco: Current User    Types: Snuff  . Tobacco comment: 09/13/2015 "quit chewing tobacco August 2016"  Substance and Sexual Activity  . Alcohol use: Yes    Alcohol/week: 0.0 standard drinks    Comment: 09/13/2015 "got out of alcohol rehab 11/18/2005"  . Drug use: Yes    Comment: 09/13/2015 "got out of rehab for pills 11/18/2005"  . Sexual activity: Yes    Birth control/protection: None  Lifestyle  . Physical activity:    Days per week: Not on file    Minutes per session: Not on file  . Stress: Not on file  Relationships  . Social connections:    Talks on phone: Not on file    Gets together: Not on file    Attends religious service: Not on file    Active member of club or organization: Not on file    Attends meetings of clubs or organizations: Not on file    Relationship status: Not on file  . Intimate partner violence:    Fear of current or ex partner: Not on file    Emotionally abused: Not on file     Physically abused: Not on file    Forced sexual activity: Not on file  Other  Topics Concern  . 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      BP 128/80   Pulse 63   Ht 5\' 7"  (1.702 m)   Wt 207 lb 9.6 oz (94.2 kg)   SpO2 98%   BMI 32.51 kg/m   Physical Exam:  Well appearing NAD HEENT: Unremarkable Neck:  No JVD, no thyromegally Lymphatics:  No adenopathy Back:  No CVA tenderness Lungs:  Clear with no wheezes HEART:  Regular rate rhythm, no murmurs, no rubs, no clicks Abd:  soft, positive bowel sounds, no organomegally, no rebound, no guarding Ext:  2 plus pulses, no edema, no cyanosis, no clubbing Skin:  No rashes no nodules Neuro:  CN II through XII intact, motor grossly intact  EKG - nsr with first degree AV block and occaisional PVC's   DEVICE  Normal device function.  See PaceArt for details.   Assess/Plan: 1. Heart block - he is conducting today with a long AV delay.  2. PPM - his medtronic DDD PM is working normally. We have reprogrammed from DDI to AAI/DDD.  3. CAD - he has non-cardiac chest pain. He will continue his regular activity. 4. HTN - his blood pressure is well controlled.   Leonia Reeves.D.

## 2018-06-26 NOTE — Patient Instructions (Signed)

## 2018-06-30 ENCOUNTER — Encounter: Payer: Self-pay | Admitting: Gastroenterology

## 2018-07-15 ENCOUNTER — Ambulatory Visit: Payer: BLUE CROSS/BLUE SHIELD | Admitting: Urology

## 2018-07-15 DIAGNOSIS — R3912 Poor urinary stream: Secondary | ICD-10-CM

## 2018-07-15 DIAGNOSIS — R972 Elevated prostate specific antigen [PSA]: Secondary | ICD-10-CM | POA: Diagnosis not present

## 2018-08-18 ENCOUNTER — Ambulatory Visit: Payer: BLUE CROSS/BLUE SHIELD | Admitting: Physician Assistant

## 2018-08-18 ENCOUNTER — Telehealth: Payer: Self-pay | Admitting: Internal Medicine

## 2018-08-18 ENCOUNTER — Encounter: Payer: Self-pay | Admitting: Physician Assistant

## 2018-08-18 VITALS — BP 122/80 | HR 76 | Ht 67.0 in | Wt 216.8 lb

## 2018-08-18 DIAGNOSIS — Z950 Presence of cardiac pacemaker: Secondary | ICD-10-CM | POA: Insufficient documentation

## 2018-08-18 DIAGNOSIS — I25119 Atherosclerotic heart disease of native coronary artery with unspecified angina pectoris: Secondary | ICD-10-CM

## 2018-08-18 DIAGNOSIS — I480 Paroxysmal atrial fibrillation: Secondary | ICD-10-CM

## 2018-08-18 DIAGNOSIS — E785 Hyperlipidemia, unspecified: Secondary | ICD-10-CM | POA: Diagnosis not present

## 2018-08-18 DIAGNOSIS — I10 Essential (primary) hypertension: Secondary | ICD-10-CM

## 2018-08-18 HISTORY — DX: Paroxysmal atrial fibrillation: I48.0

## 2018-08-18 HISTORY — DX: Presence of cardiac pacemaker: Z95.0

## 2018-08-18 LAB — BASIC METABOLIC PANEL
BUN/Creatinine Ratio: 17 (ref 10–24)
BUN: 16 mg/dL (ref 8–27)
CHLORIDE: 102 mmol/L (ref 96–106)
CO2: 21 mmol/L (ref 20–29)
Calcium: 9.9 mg/dL (ref 8.6–10.2)
Creatinine, Ser: 0.95 mg/dL (ref 0.76–1.27)
GFR calc non Af Amer: 87 mL/min/{1.73_m2} (ref >59)
GFR, EST AFRICAN AMERICAN: 100 mL/min/{1.73_m2} (ref >59)
GLUCOSE: 99 mg/dL (ref 65–99)
Potassium: 4.4 mmol/L (ref 3.5–5.2)
SODIUM: 139 mmol/L (ref 134–144)

## 2018-08-18 LAB — CBC
Hematocrit: 46.7 % (ref 37.5–51.0)
Hemoglobin: 15.9 g/dL (ref 13.0–17.7)
MCH: 31.1 pg (ref 26.6–33.0)
MCHC: 34 g/dL (ref 31.5–35.7)
MCV: 91 fL (ref 79–97)
PLATELETS: 254 10*3/uL (ref 150–450)
RBC: 5.12 x10E6/uL (ref 4.14–5.80)
RDW: 13.8 % (ref 12.3–15.4)
WBC: 7.9 10*3/uL (ref 3.4–10.8)

## 2018-08-18 MED ORDER — ROSUVASTATIN CALCIUM 10 MG PO TABS: 10.0000 mg | ORAL_TABLET | Freq: Every day | ORAL | 3 refills | Status: DC

## 2018-08-18 NOTE — Progress Notes (Signed)
Cardiology Office Note:    Date:  08/18/2018   ID:  [name] [name], DOB 1956-05-01, MRN [id number]  PCP:  [name] Lund, PA  Cardiologist/Electrophysiologist:  Lewayne Bunting, MD    Referring MD: [name] [name], [place]   Chief Complaint  Patient presents with  . Chest Pain    History of Present Illness:    [name] [name] is a 62 y.o. male with coronary artery disease s/p coronary artery bypass grafting, complete heart block s/p pacemaker implantation, paroxysmal atrial fibrillation, hypertension, hyperlipidemia.  He was previously followed by Dr. Myrtis [name].  CHADS2-VASc=2 (vascular dz, HTN).  He remains on Apixaban for anticoagulation.  He was last seen by Dr. Ladona [name] in 06/2018.      [name] [name] returns for evaluation of chest pain.  He was recently evaluated for chest pain in the ED at [facility] in [place], [place].  He works at [employer].  He was resting on a cart when his chest pain began.  He describes it as severe and crushing with radiation to his neck.  He notes associated shortness of breath and diaphoresis.  He was dizzy but denies syncope.  He took 4 baby ASA and 2 sprays of NTG.  His pain resolved after about an hour.  He has not had any exertional chest pain recently.  He has had some cramping pain off and on since he went to the ED on his L lower side.  In the ED, a BNP, CXR and Troponin were all normal.  Of note, he was admitted to the same hospital in June 2019 with similar symptoms.  He apparently had a stress test that was low risk.   Prior CV studies:   The following studies were reviewed today:  Echo 12/03/2014 Mild concentric LVH, EF 60-65, normal wall motion, grade 1 diastolic dysfunction, mild LAE, trivial TR  Cardiac catheterization 12/05/2014 Left mainstem: Normal Left anterior descending (LAD): 100% occlusion proximally. Left circumflex (LCx): Normal Right coronary artery (RCA): Normal SVG to the diagonal is widely patent.  LIMA to the LAD is widely patent.    Left ventriculography: Left ventricular systolic function is normal, LVEF is estimated at 55-65%, there is no significant mitral regurgitation  Nuclear stress test 11/04/2012 Low risk stress nuclear study with a small, moderate intensity, partially reversible anterior defect suggestive of soft tissue attenuation; cannot exclude mild anterior ischemia.  LV Ejection Fraction: 56%.  LV Wall Motion:  NL LV Function; NL Wall Motion    Past Medical History:  Diagnosis Date  . Asthma   . Atrial fibrillation [facility])    new November 2016  . Bipolar 1 disorder (HCC)    "put me on this after I got out of drug rehab 11/18/2005 and couldn't sleep; added anxiety RX and have been sleeping fine since"  . Bradycardia    Very limited Wenkebach on event recorder November 2010  . CAD (coronary artery disease) 2006   Multivessel status post CABG in [place], patent LIMA to LAD and SVG to diagonal with 40% stenosis 2010 // Nuc 2/14: low risk, EF 56 // LHC 3/16: patent L-LAD, patent S-Dx // Echo 3/16: mild conc LVH, EF 60-65, Gr 1 DD, trivial TR, mild LAE  . ED (erectile dysfunction)   . Essential hypertension   . GERD (gastroesophageal reflux disease)   . Gout   . History of stomach ulcers 1980's  . Mixed hyperlipidemia    pt states "they took me off q med they had me on  for high cholesterol; my good cholesterol is low" (09/13/2015)  . Myocardial infarction (HCC) 2006  . OSA (obstructive sleep apnea)    patient denies, stated had a sleep study and he was told he did not have    . Pneumonia 2015  . Pneumonia 1990's X 4 in one year   "double pneumonia"  . Presence of permanent cardiac pacemaker   . 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   Current Medications: Current Meds  Medication Sig  . allopurinol (ZYLOPRIM) 300 MG tablet Take 300 mg by mouth daily.    [name] apixaban (ELIQUIS) 5 MG TABS tablet Take 1 tablet (5 mg total) by mouth 2 (two) times daily.  . beta carotene w/minerals (OCUVITE) tablet Take 1 tablet by mouth daily.  . cetirizine (ZYRTEC ALLERGY) 10 MG tablet Take 10 mg by mouth daily.    [name] COLCRYS 0.6 MG tablet Take 0.6 mg by mouth daily as needed (Gout).   . furosemide (LASIX) 20 MG tablet TAKE 1 TABLET(20 MG) BY MOUTH DAILY AS NEEDED FOR SWELLING  . gabapentin (NEURONTIN) 300 MG capsule Take 1 capsule by mouth 2 (two) times daily.  . indomethacin (INDOCIN SR) 75 MG CR capsule Take 75 mg by mouth daily.   . metoprolol tartrate (LOPRESSOR) 25 MG tablet Take 1 tablet (25 mg total) by mouth daily as needed (heart rate).  . nitroGLYCERIN (NITROLINGUAL) 0.4 MG/SPRAY spray USE 1 SPRAY UNDER THE TONGUE EVERY 5 MINUTES AS NEEDED FOR CHEST PAIN  . omeprazole (PRILOSEC) 20 MG capsule Take 20 mg by mouth daily.    . QUEtiapine (SEROQUEL) 300 MG tablet Take 300 mg by mouth at bedtime.    . quinapril (ACCUPRIL) 40 MG tablet Take 1 tablet (40 mg total) by mouth daily.  . tamsulosin (FLOMAX) 0.4 MG CAPS capsule TK 1 CAPSULE PO QHS  . temazepam (RESTORIL) 30 MG capsule Take 1 capsule by mouth at bedtime.     Allergies:   Sulfonamide derivatives   Social History   Tobacco Use  . Smoking status: Former Smoker    Packs/day: 2.00    Years: 17.00    Pack years: 34.00    Types: Cigarettes    Last attempt to quit: 03/13/2017    Years since quitting: 1.4  . Smokeless tobacco: Current User    Types: Snuff  . Tobacco comment: 09/13/2015 "quit chewing tobacco August 2016"  Substance Use Topics  . Alcohol use: Yes    Alcohol/week:  0.0 standard drinks    Comment: 09/13/2015 "got out of alcohol rehab 11/18/2005"  . Drug use: Yes    Comment: 09/13/2015 "got out of rehab for pills 11/18/2005"     Family Hx: The patient's family history includes COPD in his mother; Heart attack in his father and mother; Heart disease in his father; Hyperlipidemia in his brother; Hypertension in his brother, brother, and sister; Lung cancer in his mother; Stroke in his mother.  ROS:   Please see the history of present illness.    Review of Systems  Cardiovascular: Positive for chest pain and irregular heartbeat.  Respiratory: Positive for shortness of breath.  Genitourinary: Positive for incomplete emptying.   All other systems reviewed and are negative.   EKGs/Labs/Other Test Reviewed:    EKG:  EKG is  ordered today.  The ekg ordered today demonstrates NSR, HR 75, V paced   Recent Labs: No results found for requested labs within last 8760 hours.   Recent Lipid Panel Lab Results  Component Value Date/Time   CHOL 92 12/03/2014 01:35 AM   TRIG 65 12/03/2014 01:35 AM   HDL 33 (L) 12/03/2014 01:35 AM   CHOLHDL 2.8 12/03/2014 01:35 AM   LDLCALC 46 12/03/2014 01:35 AM    Physical Exam:    VS:  BP 122/80   Pulse 76   Ht 5\' 7"  (1.702 m)   Wt 216 lb 12.8 oz (98.3 kg)   SpO2 98%   BMI 33.96 kg/m     Wt Readings from Last 3 Encounters:  08/18/18 216 lb 12.8 oz (98.3 kg)  06/26/18 207 lb 9.6 oz (94.2 kg)  03/13/17 208 lb 12.8 oz (94.7 kg)     Physical Exam  Constitutional: He is oriented to person, place, and time. He appears well-developed and well-nourished. No distress.  HENT:  Head: Normocephalic and atraumatic.  Eyes: No scleral icterus.  Neck: No JVD present. No thyromegaly present.  Cardiovascular: Normal rate and regular rhythm.  No murmur heard. Pulmonary/Chest: Effort normal. He has no rales.  Abdominal: Soft.  Musculoskeletal: He exhibits no edema.  Lymphadenopathy:    He has no cervical adenopathy.    Neurological: He is alert and oriented to person, place, and time.  Skin: Skin is warm and dry.  Psychiatric: He has a normal mood and affect.    ASSESSMENT & PLAN:    Coronary artery disease involving native coronary artery of native heart with angina pectoris [facility]) He is s/p CABG in 2006.  Cardiac Catheterization in 2016 demonstrated patent bypass grafts.  He now presents with 2 separate episodes of severe "crushing" substernal chest pain.  His symptoms are concerning for angina.  He has had workups in the ED at [facility] without significant findings.  He apparently had a Nuclear stress test in 6/19 that was low risk.  Given his symptoms, I have recommended we proceed with a Cardiac Catheterization to further evaluate.  I reviewed this with Dr. Elberta Fortis (attending MD) who agreed.  Risks and benefits of cardiac catheterization have been discussed with the patient.  These include bleeding, infection, kidney damage, stroke, heart attack, death.  The patient understands these risks and is willing to proceed.  He will need to call us back to scheduled once he can look at his work calendar.  -Schedule Cardiac Catheterization in the next 2 weeks  -Continue beta-blocker, prn NTG  -Obtain BMET, CBC today  -Go to ED if recurrent symptoms.  -Obtain records from [facility] in [place], [place]  PAF (paroxysmal atrial fibrillation) (HCC) Maintaining NSR.  Will hold Apixaban for 48 hours prior to his Cardiac Catheterization and resume after when safe.  Essential hypertension The patient's blood pressure is controlled on his current regimen.  Continue current therapy.    Dyslipidemia  He is currently untreated.  He was taken off of Lipitor years ago for leg cramping.  I have suggested we try Crestor to see if he can tolerate this.  -Crestor 10 mg QD  -Lipids, LFTs in 8 weeks  Cardiac pacemaker in situ  Follow up with EP as planned.   Dispo:  Return in about 2 weeks (around 09/01/2018) for Post  Procedure Follow Up w/ Dr. Ladona [name] , or Tereso Newcomer, PA-C.   Medication Adjustments/Labs and Tests Ordered: Current medicines are reviewed at length with the patient today.  Concerns regarding medicines are outlined above.  Tests Ordered: Orders Placed This Encounter  Procedures  . Basic metabolic panel  . CBC  . Hepatic function panel  . Lipid panel  . EKG 12-Lead   Medication Changes: Meds ordered this encounter  Medications  . rosuvastatin (CRESTOR) 10 MG tablet    Sig: Take 1 tablet (10 mg total) by mouth daily.    Dispense:  90 tablet    Refill:  3    Signed, Tereso Newcomer, PA-C  08/18/2018 12:25 PM    [facility] [address] [place], [place], [place]  [address] Phone: [phone]; Fax: [phone]

## 2018-08-18 NOTE — H&P (View-Only) (Signed)
Cardiology Office Note:    Date:  08/18/2018   ID:  [name] [name], DOB 01/27/1956, MRN [id number]  PCP:  Becker, Anna G, PA  Cardiologist/Electrophysiologist:  Gregg Taylor, MD    Referring MD: Becker, Anna G, PA   Chief Complaint  Patient presents with  . Chest Pain    History of Present Illness:    [name] [name] is a 62 y.o. male with coronary artery disease s/p coronary artery bypass grafting, complete heart block s/p pacemaker implantation, paroxysmal atrial fibrillation, hypertension, hyperlipidemia.  He was previously followed by Dr. Katz.  CHADS2-VASc=2 (vascular dz, HTN).  He remains on Apixaban for anticoagulation.  He was last seen by Dr. Taylor in 06/2018.      [name] [name] returns for evaluation of chest pain.  He was recently evaluated for chest pain in the ED at [facility] in [place], [place].  He works at [employer].  He was resting on a cart when his chest pain began.  He describes it as severe and crushing with radiation to his neck.  He notes associated shortness of breath and diaphoresis.  He was dizzy but denies syncope.  He took 4 baby ASA and 2 sprays of NTG.  His pain resolved after about an hour.  He has not had any exertional chest pain recently.  He has had some cramping pain off and on since he went to the ED on his L lower side.  In the ED, a BNP, CXR and Troponin were all normal.  Of note, he was admitted to the same hospital in June 2019 with similar symptoms.  He apparently had a stress test that was low risk.   Prior CV studies:   The following studies were reviewed today:  Echo 12/03/2014 Mild concentric LVH, EF 60-65, normal wall motion, grade 1 diastolic dysfunction, mild LAE, trivial TR  Cardiac catheterization 12/05/2014 Left mainstem: Normal Left anterior descending (LAD): 100% occlusion proximally. Left circumflex (LCx): Normal Right coronary artery (RCA): Normal SVG to the diagonal is widely patent.  LIMA to the LAD is widely patent.    Left ventriculography: Left ventricular systolic function is normal, LVEF is estimated at 55-65%, there is no significant mitral regurgitation  Nuclear stress test 11/04/2012 Low risk stress nuclear study with a small, moderate intensity, partially reversible anterior defect suggestive of soft tissue attenuation; cannot exclude mild anterior ischemia.  LV Ejection Fraction: 56%.  LV Wall Motion:  NL LV Function; NL Wall Motion    Past Medical History:  Diagnosis Date  . Asthma   . Atrial fibrillation (HCC)    new November 2016  . Bipolar 1 disorder (HCC)    "put me on this after I got out of drug rehab 11/18/2005 and couldn't sleep; added anxiety RX and have been sleeping fine since"  . Bradycardia    Very limited Wenkebach on event recorder November 2010  . CAD (coronary artery disease) 2006   Multivessel status post CABG in [place], patent LIMA to LAD and SVG to diagonal with 40% stenosis 2010 // Nuc 2/14: low risk, EF 56 // LHC 3/16: patent L-LAD, patent S-Dx // Echo 3/16: mild conc LVH, EF 60-65, Gr 1 DD, trivial TR, mild LAE  . ED (erectile dysfunction)   . Essential hypertension   . GERD (gastroesophageal reflux disease)   . Gout   . History of stomach ulcers 1980's  . Mixed hyperlipidemia    pt states "they took me off q med they had me on   for high cholesterol; my good cholesterol is low" (09/13/2015)  . Myocardial infarction (HCC) 2006  . OSA (obstructive sleep apnea)    patient denies, stated had a sleep study and he was told he did not have    . Pneumonia 2015  . Pneumonia 1990's X 4 in one year   "double pneumonia"  . Presence of permanent cardiac pacemaker   . 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(HCC)    Surgical Hx: The patient  has a past surgical history that includes left heart catheterization with coronary/graft angiogram (N/A, 12/05/2014); Insert / replace / remove pacemaker (09/13/2015); Back surgery; Lumbar disc surgery (X 3); Maximum access (mas)posterior lumbar  interbody fusion (plif) 1 level (~ 2003); Coronary artery bypass graft (2006); Cardiac catheterization; Leg Surgery (Left, 1961); Coronary angioplasty; and Cardiac catheterization (N/A, 09/13/2015).   Current Medications: Current Meds  Medication Sig  . allopurinol (ZYLOPRIM) 300 MG tablet Take 300 mg by mouth daily.    . apixaban (ELIQUIS) 5 MG TABS tablet Take 1 tablet (5 mg total) by mouth 2 (two) times daily.  . beta carotene w/minerals (OCUVITE) tablet Take 1 tablet by mouth daily.  . cetirizine (ZYRTEC ALLERGY) 10 MG tablet Take 10 mg by mouth daily.    . COLCRYS 0.6 MG tablet Take 0.6 mg by mouth daily as needed (Gout).   . furosemide (LASIX) 20 MG tablet TAKE 1 TABLET(20 MG) BY MOUTH DAILY AS NEEDED FOR SWELLING  . gabapentin (NEURONTIN) 300 MG capsule Take 1 capsule by mouth 2 (two) times daily.  . indomethacin (INDOCIN SR) 75 MG CR capsule Take 75 mg by mouth daily.   . metoprolol tartrate (LOPRESSOR) 25 MG tablet Take 1 tablet (25 mg total) by mouth daily as needed (heart rate).  . nitroGLYCERIN (NITROLINGUAL) 0.4 MG/SPRAY spray USE 1 SPRAY UNDER THE TONGUE EVERY 5 MINUTES AS NEEDED FOR CHEST PAIN  . omeprazole (PRILOSEC) 20 MG capsule Take 20 mg by mouth daily.    . QUEtiapine (SEROQUEL) 300 MG tablet Take 300 mg by mouth at bedtime.    . quinapril (ACCUPRIL) 40 MG tablet Take 1 tablet (40 mg total) by mouth daily.  . tamsulosin (FLOMAX) 0.4 MG CAPS capsule TK 1 CAPSULE PO QHS  . temazepam (RESTORIL) 30 MG capsule Take 1 capsule by mouth at bedtime.     Allergies:   Sulfonamide derivatives   Social History   Tobacco Use  . Smoking status: Former Smoker    Packs/day: 2.00    Years: 17.00    Pack years: 34.00    Types: Cigarettes    Last attempt to quit: 03/13/2017    Years since quitting: 1.4  . Smokeless tobacco: Current User    Types: Snuff  . Tobacco comment: 09/13/2015 "quit chewing tobacco August 2016"  Substance Use Topics  . Alcohol use: Yes    Alcohol/week:  0.0 standard drinks    Comment: 09/13/2015 "got out of alcohol rehab 11/18/2005"  . Drug use: Yes    Comment: 09/13/2015 "got out of rehab for pills 11/18/2005"     Family Hx: The patient's family history includes COPD in his mother; Heart attack in his father and mother; Heart disease in his father; Hyperlipidemia in his brother; Hypertension in his brother, brother, and sister; Lung cancer in his mother; Stroke in his mother.  ROS:   Please see the history of present illness.    Review of Systems  Cardiovascular: Positive for chest pain and irregular heartbeat.  Respiratory: Positive for shortness of breath.     Genitourinary: Positive for incomplete emptying.   All other systems reviewed and are negative.   EKGs/Labs/Other Test Reviewed:    EKG:  EKG is  ordered today.  The ekg ordered today demonstrates NSR, HR 75, V paced   Recent Labs: No results found for requested labs within last 8760 hours.   Recent Lipid Panel Lab Results  Component Value Date/Time   CHOL 92 12/03/2014 01:35 AM   TRIG 65 12/03/2014 01:35 AM   HDL 33 (L) 12/03/2014 01:35 AM   CHOLHDL 2.8 12/03/2014 01:35 AM   LDLCALC 46 12/03/2014 01:35 AM    Physical Exam:    VS:  BP 122/80   Pulse 76   Ht 5' 7" (1.702 m)   Wt 216 lb 12.8 oz (98.3 kg)   SpO2 98%   BMI 33.96 kg/m     Wt Readings from Last 3 Encounters:  08/18/18 216 lb 12.8 oz (98.3 kg)  06/26/18 207 lb 9.6 oz (94.2 kg)  03/13/17 208 lb 12.8 oz (94.7 kg)     Physical Exam  Constitutional: He is oriented to person, place, and time. He appears well-developed and well-nourished. No distress.  HENT:  Head: Normocephalic and atraumatic.  Eyes: No scleral icterus.  Neck: No JVD present. No thyromegaly present.  Cardiovascular: Normal rate and regular rhythm.  No murmur heard. Pulmonary/Chest: Effort normal. He has no rales.  Abdominal: Soft.  Musculoskeletal: He exhibits no edema.  Lymphadenopathy:    He has no cervical adenopathy.    Neurological: He is alert and oriented to person, place, and time.  Skin: Skin is warm and dry.  Psychiatric: He has a normal mood and affect.    ASSESSMENT & PLAN:    Coronary artery disease involving native coronary artery of native heart with angina pectoris (HCC) He is s/p CABG in 2006.  Cardiac Catheterization in 2016 demonstrated patent bypass grafts.  He now presents with 2 separate episodes of severe "crushing" substernal chest pain.  His symptoms are concerning for angina.  He has had workups in the ED at [place] without significant findings.  He apparently had a Nuclear stress test in 6/19 that was low risk.  Given his symptoms, I have recommended we proceed with a Cardiac Catheterization to further evaluate.  I reviewed this with Dr. Camnitz (attending MD) who agreed.  Risks and benefits of cardiac catheterization have been discussed with the patient.  These include bleeding, infection, kidney damage, stroke, heart attack, death.  The patient understands these risks and is willing to proceed.  He will need to call us back to scheduled once he can look at his work calendar.  -Schedule Cardiac Catheterization in the next 2 weeks  -Continue beta-blocker, prn NTG  -Obtain BMET, CBC today  -Go to ED if recurrent symptoms.  -Obtain records from [facility] in [place], [place]  PAF (paroxysmal atrial fibrillation) (HCC) Maintaining NSR.  Will hold Apixaban for 48 hours prior to his Cardiac Catheterization and resume after when safe.  Essential hypertension The patient's blood pressure is controlled on his current regimen.  Continue current therapy.    Dyslipidemia  He is currently untreated.  He was taken off of Lipitor years ago for leg cramping.  I have suggested we try Crestor to see if he can tolerate this.  -Crestor 10 mg QD  -Lipids, LFTs in 8 weeks  Cardiac pacemaker in situ  Follow up with EP as planned.   Dispo:  Return in about 2 weeks (around 09/01/2018) for Post    Procedure Follow Up w/ Dr. Taylor , or Armany Mano, PA-C.   Medication Adjustments/Labs and Tests Ordered: Current medicines are reviewed at length with the patient today.  Concerns regarding medicines are outlined above.  Tests Ordered: Orders Placed This Encounter  Procedures  . Basic metabolic panel  . CBC  . Hepatic function panel  . Lipid panel  . EKG 12-Lead   Medication Changes: Meds ordered this encounter  Medications  . rosuvastatin (CRESTOR) 10 MG tablet    Sig: Take 1 tablet (10 mg total) by mouth daily.    Dispense:  90 tablet    Refill:  3    Signed, Snow Peoples, PA-C  08/18/2018 12:25 PM    [facility] [address], [place], [place]  [address] Phone: [phone]; Fax: [phone]    

## 2018-08-18 NOTE — Patient Instructions (Addendum)
Medication Instructions:  1. START CRESTOR 10 MG DAILY.  If you need a refill on your cardiac medications before your next appointment, please call your pharmacy.   Lab work: TODAY: BMET, CBC  LABS TO BE DONE IN 8 WEEKS: LFTS, LIPIDS  If you have labs (blood work) drawn today and your tests are completely normal, you will receive your results only by: [name] MyChart Message (if you have MyChart) OR . A paper copy in the mail If you have any lab test that is abnormal or we need to change your treatment, we will call you to review the results.  Testing/Procedures: Your physician has requested that you have a cardiac catheterization. Cardiac catheterization is used to diagnose and/or treat various heart conditions. Doctors may recommend this procedure for a number of different reasons. The most common reason is to evaluate chest pain. Chest pain can be a symptom of coronary artery disease (CAD), and cardiac catheterization can show whether plaque is narrowing or blocking your heart's arteries. This procedure is also used to evaluate the valves, as well as measure the blood flow and oxygen levels in different parts of your heart. For further information please visit [email or web address] Please follow instruction sheet, as given.    Follow-Up: At [facility], you and your health needs are our priority.  As part of our continuing mission to provide you with exceptional heart care, we have created designated Provider Care Teams.  These Care Teams include your primary Cardiologist (physician) and Advanced Practice Providers (APPs -  Physician Assistants and Nurse Practitioners) who all work together to provide you with the care you need, when you need it. You will need a follow up appointment in:  2 weeks.  Please call our office in advance to schedule this appointment.  You may see Lewayne Bunting, MD or Tereso Newcomer, PA-C     Any Other Special Instructions Will Be Listed Below (If  Applicable).    [facility] CARDIOVASCULAR DIVISION [facility] ST OFFICE [address] [place] [address] Dept: [phone] Loc: [phone]  [name] [name]  08/18/2018  You are scheduled for a Cardiac Catheterization on Dec. 11th   with Dr. Tresa Endo. PLEASE CALL OUR OFFICE TO SCHEDULE A TIME AND DATE THAT IS BEST FOR YOU. [phone]  1. Please arrive at the [facility] (Main Entrance A) at [facility]: [address] [place], [place] [address] at                   (This time is two hours before your procedure to ensure your preparation). Free valet parking service is available.   Special note: Every effort is made to have your procedure done on time. Please understand that emergencies sometimes delay scheduled procedures.  2. Diet: Do not eat solid foods after midnight.  The patient may have clear liquids until 5am upon the day of the procedure.  3. Labs: You will need to have blood drawn on TODAY. You do not need to be fasting.  4. Medication instructions in preparation for your procedure:   Contrast Allergy: No    * STOP TAKING ELIQUIS 48 HOURS BEFORE PROCEDURE. * HOLD YOUR LASIX THE MORNING OF PROCEDURE    On the morning of your procedure, take your Aspirin and any morning medicines NOT listed above.  You may use sips of water.  5. Plan for one night stay--bring personal belongings. 6. Bring a current list of your medications and  current insurance cards. 7. You MUST have a responsible person to drive you home. 8. Someone MUST be with you the first 24 hours after you arrive home or your discharge will be delayed. 9. Please wear clothes that are easy to get on and off and wear slip-on shoes.  Thank you for allowing [place] to care for you!   -- [facility]

## 2018-08-18 NOTE — Telephone Encounter (Signed)
Patient's wife is calling to set up her husband cath.

## 2018-08-19 NOTE — Telephone Encounter (Signed)
Spoke with pt's wife Gloriajean Dell (Dpr on file) about Cath appt. I have set up Cath appt with Dr. Tresa Endo on 12/11 @ 7:30 am. Per Tereso Newcomer need post cath f/u 2 weeks after procedure. F/u is scheduled for 12/31 @ 11:15 am. Also while I had pt's wife the phone, I schedule appt for labs per Scott's office note for 12/3 to have lfts and lipids in 8 weeks. Lab appt is scheduled for 10/12/18. Pt's wife verbalized understanding.

## 2018-08-19 NOTE — Telephone Encounter (Signed)
I have attempted to contact this patient by phone with the following results but was unable to leave message on voicemail. Will try again later.

## 2018-08-20 ENCOUNTER — Encounter: Payer: Self-pay | Admitting: Gastroenterology

## 2018-08-20 NOTE — Addendum Note (Signed)
Addended byAlben Spittle, Lorin Picket T on: 08/20/2018 04:50 PM   Modules accepted: Orders, SmartSet

## 2018-08-24 ENCOUNTER — Telehealth: Payer: Self-pay | Admitting: *Deleted

## 2018-08-24 NOTE — Telephone Encounter (Signed)
I reviewed instructions with patient's wife, she verbalized understanding, thanked me for call.

## 2018-08-24 NOTE — Telephone Encounter (Signed)
Pt contacted pre-catheterization scheduled at [facility] for: Wednesday August 26, 2018 8:30 AM Verified arrival time and place: [facility] Main Entrance A at: 6:30 AM  No solid food after midnight prior to cath, clear liquids until 5 AM day of procedure. Contrast allergy: no  Hold: Eliquis-08/24/18/until post procedure. Furosemide-AM of procedure.  Except hold medications AM meds can be  taken pre-cath with sip of water including: ASA 81 mg  Confirm patient has responsible person to drive home post procedure and for 24 hours after you arrive home.  LMTCB to review instructions with patient.

## 2018-08-26 ENCOUNTER — Encounter (HOSPITAL_COMMUNITY)
Admission: RE | Disposition: A | Discharge status: Home / Self Care | Payer: Self-pay | Source: Home / Self Care | Attending: Cardiovascular Disease

## 2018-08-26 ENCOUNTER — Ambulatory Visit (HOSPITAL_COMMUNITY)
Admission: RE | Admit: 2018-08-26 | Discharge: 2018-08-26 | Disposition: A | Discharge status: Home / Self Care | Payer: BLUE CROSS/BLUE SHIELD | Attending: Cardiovascular Disease | Admitting: Cardiovascular Disease

## 2018-08-26 ENCOUNTER — Encounter (HOSPITAL_COMMUNITY): Payer: Self-pay | Admitting: Cardiovascular Disease

## 2018-08-26 ENCOUNTER — Other Ambulatory Visit: Payer: Self-pay

## 2018-08-26 DIAGNOSIS — Z955 Presence of coronary angioplasty implant and graft: Secondary | ICD-10-CM | POA: Diagnosis not present

## 2018-08-26 DIAGNOSIS — G4733 Obstructive sleep apnea (adult) (pediatric): Secondary | ICD-10-CM | POA: Diagnosis not present

## 2018-08-26 DIAGNOSIS — Z882 Allergy status to sulfonamides status: Secondary | ICD-10-CM | POA: Insufficient documentation

## 2018-08-26 DIAGNOSIS — Z87891 Personal history of nicotine dependence: Secondary | ICD-10-CM | POA: Diagnosis not present

## 2018-08-26 DIAGNOSIS — Z950 Presence of cardiac pacemaker: Secondary | ICD-10-CM | POA: Diagnosis not present

## 2018-08-26 DIAGNOSIS — Z8249 Family history of ischemic heart disease and other diseases of the circulatory system: Secondary | ICD-10-CM | POA: Insufficient documentation

## 2018-08-26 DIAGNOSIS — I2582 Chronic total occlusion of coronary artery: Secondary | ICD-10-CM | POA: Insufficient documentation

## 2018-08-26 DIAGNOSIS — K219 Gastro-esophageal reflux disease without esophagitis: Secondary | ICD-10-CM | POA: Diagnosis not present

## 2018-08-26 DIAGNOSIS — M109 Gout, unspecified: Secondary | ICD-10-CM | POA: Insufficient documentation

## 2018-08-26 DIAGNOSIS — Z823 Family history of stroke: Secondary | ICD-10-CM | POA: Insufficient documentation

## 2018-08-26 DIAGNOSIS — I10 Essential (primary) hypertension: Secondary | ICD-10-CM | POA: Diagnosis not present

## 2018-08-26 DIAGNOSIS — Z79899 Other long term (current) drug therapy: Secondary | ICD-10-CM | POA: Diagnosis not present

## 2018-08-26 DIAGNOSIS — I25119 Atherosclerotic heart disease of native coronary artery with unspecified angina pectoris: Secondary | ICD-10-CM | POA: Diagnosis not present

## 2018-08-26 DIAGNOSIS — E782 Mixed hyperlipidemia: Secondary | ICD-10-CM | POA: Insufficient documentation

## 2018-08-26 DIAGNOSIS — I480 Paroxysmal atrial fibrillation: Secondary | ICD-10-CM | POA: Diagnosis not present

## 2018-08-26 DIAGNOSIS — Z951 Presence of aortocoronary bypass graft: Secondary | ICD-10-CM | POA: Insufficient documentation

## 2018-08-26 DIAGNOSIS — I252 Old myocardial infarction: Secondary | ICD-10-CM | POA: Diagnosis not present

## 2018-08-26 DIAGNOSIS — Z7901 Long term (current) use of anticoagulants: Secondary | ICD-10-CM | POA: Insufficient documentation

## 2018-08-26 HISTORY — PX: LEFT HEART CATH AND CORS/GRAFTS ANGIOGRAPHY: CATH118250

## 2018-08-26 SURGERY — LEFT HEART CATH AND CORS/GRAFTS ANGIOGRAPHY
Anesthesia: LOCAL

## 2018-08-26 MED ORDER — FENTANYL CITRATE (PF) 100 MCG/2ML IJ SOLN
INTRAMUSCULAR | Status: DC | PRN
  Administered 2018-08-26: 50 ug via INTRAVENOUS
  Administered 2018-08-26: 25 ug via INTRAVENOUS

## 2018-08-26 MED ORDER — ASPIRIN 81 MG PO CHEW: 81.0000 mg | CHEWABLE_TABLET | ORAL | Status: DC

## 2018-08-26 MED ORDER — IOHEXOL 350 MG/ML SOLN
INTRAVENOUS | Status: DC | PRN
  Administered 2018-08-26: 75 mL via INTRA_ARTERIAL

## 2018-08-26 MED ORDER — SODIUM CHLORIDE 0.9 % IV SOLN: INTRAVENOUS | Status: DC

## 2018-08-26 MED ORDER — HEPARIN (PORCINE) IN NACL 1000-0.9 UT/500ML-% IV SOLN
INTRAVENOUS | Status: AC
  Filled 2018-08-26: qty 1000

## 2018-08-26 MED ORDER — DIAZEPAM 5 MG PO TABS: 5.0000 mg | ORAL_TABLET | Freq: Four times a day (QID) | ORAL | Status: DC | PRN

## 2018-08-26 MED ORDER — SODIUM CHLORIDE 0.9% FLUSH: 3.0000 mL | Freq: Two times a day (BID) | INTRAVENOUS | Status: DC

## 2018-08-26 MED ORDER — HEPARIN (PORCINE) IN NACL 1000-0.9 UT/500ML-% IV SOLN
INTRAVENOUS | Status: DC | PRN
  Administered 2018-08-26 (×2): 500 mL

## 2018-08-26 MED ORDER — SODIUM CHLORIDE 0.9 % WEIGHT BASED INFUSION: 1.0000 mL/kg/h | INTRAVENOUS | Status: DC

## 2018-08-26 MED ORDER — FENTANYL CITRATE (PF) 100 MCG/2ML IJ SOLN
INTRAMUSCULAR | Status: AC
  Filled 2018-08-26: qty 2

## 2018-08-26 MED ORDER — MIDAZOLAM HCL 2 MG/2ML IJ SOLN
INTRAMUSCULAR | Status: AC
  Filled 2018-08-26: qty 2

## 2018-08-26 MED ORDER — LIDOCAINE HCL (PF) 1 % IJ SOLN
INTRAMUSCULAR | Status: AC
  Filled 2018-08-26: qty 30

## 2018-08-26 MED ORDER — MIDAZOLAM HCL 2 MG/2ML IJ SOLN
INTRAMUSCULAR | Status: DC | PRN
  Administered 2018-08-26: 2 mg via INTRAVENOUS
  Administered 2018-08-26: 1 mg via INTRAVENOUS

## 2018-08-26 MED ORDER — SODIUM CHLORIDE 0.9% FLUSH: 3.0000 mL | INTRAVENOUS | Status: DC | PRN

## 2018-08-26 MED ORDER — VERAPAMIL HCL 2.5 MG/ML IV SOLN
INTRAVENOUS | Status: AC
  Filled 2018-08-26: qty 2

## 2018-08-26 MED ORDER — ONDANSETRON HCL 4 MG/2ML IJ SOLN: 4.0000 mg | Freq: Four times a day (QID) | INTRAMUSCULAR | Status: DC | PRN

## 2018-08-26 MED ORDER — ACETAMINOPHEN 325 MG PO TABS: 650.0000 mg | ORAL_TABLET | ORAL | Status: DC | PRN

## 2018-08-26 MED ORDER — SODIUM CHLORIDE 0.9 % IV SOLN: 250.0000 mL | INTRAVENOUS | Status: DC | PRN

## 2018-08-26 MED ORDER — ASPIRIN 81 MG PO CHEW: 81.0000 mg | CHEWABLE_TABLET | Freq: Every day | ORAL | Status: DC

## 2018-08-26 MED ORDER — SODIUM CHLORIDE 0.9 % WEIGHT BASED INFUSION
3.0000 mL/kg/h | INTRAVENOUS | Status: AC
  Administered 2018-08-26: 3 mL/kg/h via INTRAVENOUS

## 2018-08-26 MED ORDER — LIDOCAINE HCL (PF) 1 % IJ SOLN
INTRAMUSCULAR | Status: DC | PRN
  Administered 2018-08-26: 25 mL

## 2018-08-26 SURGICAL SUPPLY — 8 items
CATH INFINITI 5FR MULTPACK ANG (CATHETERS) ×2 IMPLANT
KIT HEART LEFT (KITS) ×2 IMPLANT
PACK CARDIAC CATHETERIZATION (CUSTOM PROCEDURE TRAY) ×2 IMPLANT
SHEATH PINNACLE 5F 10CM (SHEATH) ×2 IMPLANT
SYR MEDRAD MARK 7 150ML (SYRINGE) ×2 IMPLANT
TRANSDUCER W/STOPCOCK (MISCELLANEOUS) ×2 IMPLANT
TUBING CIL FLEX 10 FLL-RA (TUBING) ×2 IMPLANT
WIRE EMERALD 3MM-J .035X150CM (WIRE) ×2 IMPLANT

## 2018-08-26 NOTE — Discharge Instructions (Signed)

## 2018-08-26 NOTE — Progress Notes (Addendum)
Site area: RFA Site Prior to Removal:  Level 0 Pressure Applied For:25 min Manual:  yes  Patient Status During Pull:  stable Post Pull Site:  Level 0 Post Pull Instructions Given:  yes Post Pull Pulses Present: palpable Dressing Applied:  clear Bedrest begins @ 1000 till 1400 Comments:   

## 2018-08-26 NOTE — Progress Notes (Signed)
No evidence of bleeding or hematoma noted post ambulation

## 2018-08-26 NOTE — Interval H&P Note (Signed)
Cath Lab Visit (complete for each Cath Lab visit)  Clinical Evaluation Leading to the Procedure:   ACS: No.  Non-ACS:    Anginal Classification: CCS III  Anti-ischemic medical therapy: Minimal Therapy (1 class of medications)  Non-Invasive Test Results: No non-invasive testing performed  Prior CABG: Previous CABG      History and Physical Interval Note:  08/26/2018 8:33 AM  [name] [name]  has presented today for surgery, with the diagnosis of cad with angina  The various methods of treatment have been discussed with the patient and family. After consideration of risks, benefits and other options for treatment, the patient has consented to  Procedure(s): LEFT HEART CATH AND CORS/GRAFTS ANGIOGRAPHY (N/A) as a surgical intervention .  The patient's history has been reviewed, patient examined, no change in status, stable for surgery.  I have reviewed the patient's chart and labs.  Questions were answered to the patient's satisfaction.     [name] [name]

## 2018-09-15 ENCOUNTER — Encounter: Payer: Self-pay | Admitting: Physician Assistant

## 2018-09-15 ENCOUNTER — Ambulatory Visit: Payer: BLUE CROSS/BLUE SHIELD | Admitting: Physician Assistant

## 2018-09-15 VITALS — BP 114/80 | HR 80 | Ht 68.0 in | Wt 222.8 lb

## 2018-09-15 DIAGNOSIS — I480 Paroxysmal atrial fibrillation: Secondary | ICD-10-CM

## 2018-09-15 DIAGNOSIS — E785 Hyperlipidemia, unspecified: Secondary | ICD-10-CM

## 2018-09-15 DIAGNOSIS — Z950 Presence of cardiac pacemaker: Secondary | ICD-10-CM

## 2018-09-15 DIAGNOSIS — I2510 Atherosclerotic heart disease of native coronary artery without angina pectoris: Secondary | ICD-10-CM | POA: Diagnosis not present

## 2018-09-15 DIAGNOSIS — I5022 Chronic systolic (congestive) heart failure: Secondary | ICD-10-CM

## 2018-09-15 DIAGNOSIS — I10 Essential (primary) hypertension: Secondary | ICD-10-CM | POA: Diagnosis not present

## 2018-09-15 HISTORY — DX: Chronic systolic (congestive) heart failure: I50.22

## 2018-09-15 MED ORDER — METOPROLOL SUCCINATE ER 25 MG PO TB24: 25.0000 mg | ORAL_TABLET | Freq: Every day | ORAL | 3 refills | Status: DC

## 2018-09-15 NOTE — Progress Notes (Signed)
Cardiology Office Note:    Date:  09/15/2018   ID:  [name] [name], DOB Oct 22, 1955, MRN [id number]  PCP:  Wilfrid Lund, PA  Cardiologist/Electrophysiologist:  Lewayne Bunting, MD     Referring MD: Wilfrid Lund, [place]   Chief Complaint  Patient presents with  . Hospitalization Follow-up    s/p outpatient cardiac cath     History of Present Illness:    LOYS [name] is a 62 y.o. male with coronary artery disease s/p coronary artery bypass grafting, complete heart block s/p pacemaker implantation, paroxysmal atrial fibrillation, hypertension, hyperlipidemia.  He was previously followed by Dr. Myrtis Ser.  CHADS2-VASc=2 (vascular dz, HTN).  He remains on Apixaban for anticoagulation.    He was last seen 08/18/2018 after presenting to the emergency room in [place], [place] with chest pain.  He also had been admitted to [facility] in [place] in June, 2019.  Stress test at that time demonstrated no ischemia.  His symptoms were concerning for unstable angina.  Cardiac catheterization was arranged.  This demonstrated mild LV dysfunction with an EF of 45, native CAD with total occlusion of the LAD at the ostium, patent vein graft to the diagonal patent LIMA to the LAD.  Medical therapy was recommended.   Mr. Broxterman returns for follow up.  He continues to have occasional chest pain but no significant episodes.  He denies shortness of breath, syncope, paroxysmal nocturnal dyspnea, leg swelling.  He has been told he has gallstones.  He sees GI in Feb 2020 to arrange a screening colonoscopy.    Prior CV studies:   The following studies were reviewed today:  Cardiac catheterization 08/26/2018 LAD ostial 100 LCx proximal 20 SVG-D1 patent with mid 15 LIMA-LAD patent EF 45 with mid to distal mild anterolateral hypocontractility     Nuclear stress test 03/06/2018 [facility] [place] [place]) Normal perfusion, no ischemia, EF 62  Echo 12/03/2014 Mild concentric LVH, EF 60-65, normal wall  motion, grade 1 diastolic dysfunction, mild LAE, trivial TR  Cardiac catheterization 12/05/2014 Left mainstem:Normal Left anterior descending (LAD):100% occlusion proximally. Left circumflex (LCx):Normal Right coronary artery (RCA):Normal SVG to the diagonal is widely patent.  LIMA to the LAD is widely patent.  Left ventriculography: Left ventricular systolic function is normal, LVEF is estimated at 55-65%, there is no significant mitral regurgitation  Nuclear stress test 11/04/2012 Low risk stress nuclear study with a small, moderate intensity, partially reversible anterior defect suggestive of soft tissue attenuation; cannot exclude mild anterior ischemia.  LV Ejection Fraction: 56%. LV Wall Motion: NL LV Function; NL Wall Motion  Past Medical History:  Diagnosis Date  . Asthma   . Atrial fibrillation [facility])    new November 2016  . Bipolar 1 disorder (HCC)    "put me on this after I got out of drug rehab 11/18/2005 and couldn't sleep; added anxiety RX and have been sleeping fine since"  . Bradycardia    Very limited Wenkebach on event recorder November 2010  . CAD (coronary artery disease) 2006   3v CAD >> s/p CABG in [place], patent LIMA to LAD and SVG to diagonal with 40% stenosis 2010 // Nuc 2/14: low risk, EF 56 // LHC 3/16: patent L-LAD, patent S-Dx // Echo 3/16: mild conc LVH, EF 60-65, Gr 1 DD, trivial TR, mild LAE // LHC 12/19: oLAD 100, pLCx 20, S-D1 patent, L-LAD patent, EF 45  . Chronic systolic CHF (congestive heart failure) (HCC) 09/15/2018   EF 45 by LHC in 12/19  .  ED (erectile dysfunction)   . Essential hypertension   . GERD (gastroesophageal reflux disease)   . Gout   . History of stomach ulcers 1980's  . Mixed hyperlipidemia    pt states "they took me off q med they had me on for high cholesterol; my good cholesterol is low" (09/13/2015)  . Myocardial infarction (HCC) 2006  . OSA (obstructive sleep apnea)    patient denies, stated had a sleep study and he  was told he did not have    . Pneumonia 2015  . Pneumonia 1990's X 4 in one year   "double pneumonia"  . Presence of permanent cardiac pacemaker   . 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   Current Medications: Current Meds  Medication Sig  . allopurinol (ZYLOPRIM) 300 MG tablet Take 300 mg by mouth daily.    [name] apixaban (ELIQUIS) 5 MG TABS tablet Take 1 tablet (5 mg total) by mouth 2 (two) times daily.  . beta carotene w/minerals (OCUVITE) tablet Take 1 tablet by mouth daily.  [name] COLCRYS 0.6 MG tablet Take 0.6 mg by mouth daily as needed (Gout).   . furosemide (LASIX) 20 MG tablet TAKE 1 TABLET(20 MG) BY MOUTH DAILY AS NEEDED FOR SWELLING  . gabapentin (NEURONTIN) 300 MG capsule Take 300 mg by mouth 4 (four) times daily.   . magnesium oxide (MAG-OX) 400 MG tablet Take 200 mg by mouth 2 (two) times daily.  . nitroGLYCERIN (NITROLINGUAL) 0.4 MG/SPRAY spray USE 1 SPRAY UNDER THE TONGUE EVERY 5 MINUTES AS NEEDED FOR CHEST PAIN  . omeprazole (PRILOSEC) 20 MG capsule Take 20 mg by mouth daily.    [name] OVER THE COUNTER MEDICATION Take 1 tablet by mouth daily. Allergy relief  . QUEtiapine (SEROQUEL) 300 MG tablet Take 300 mg by mouth at bedtime.    . quinapril (ACCUPRIL) 40 MG tablet Take 1 tablet (40 mg total) by mouth daily.  . rosuvastatin (CRESTOR) 10 MG tablet Take 1 tablet (10 mg total) by mouth daily.  . tamsulosin (FLOMAX) 0.4 MG CAPS capsule Take 0.4 mg by mouth at  bedtime.   . temazepam (RESTORIL) 30 MG capsule Take 30 mg by mouth at bedtime.   . [DISCONTINUED] metoprolol tartrate (LOPRESSOR) 25 MG tablet Take 1 tablet (25 mg total) by mouth daily as needed (heart rate).     Allergies:   Sulfonamide derivatives   Social History   Tobacco Use  . Smoking status: Former Smoker    Packs/day: 2.00    Years: 17.00    Pack years: 34.00    Types: Cigarettes    Last attempt to quit: 03/13/2017    Years since quitting: 1.5  . Smokeless tobacco: Current User    Types: Snuff  . Tobacco comment: 09/13/2015 "quit chewing tobacco August 2016"  Substance Use Topics  . Alcohol use: Yes    Alcohol/week: 0.0 standard drinks    Comment: 09/13/2015 "got out of alcohol rehab 11/18/2005"  . Drug use: Yes    Comment: 09/13/2015 "got out of rehab for pills 11/18/2005"     Family Hx: The  patient's family history includes COPD in his mother; Heart attack in his father and mother; Heart disease in his father; Hyperlipidemia in his brother; Hypertension in his brother, brother, and sister; Lung cancer in his mother; Stroke in his mother.  ROS:   Please see the history of present illness.    Review of Systems  Cardiovascular: Positive for chest pain.   All other systems reviewed and are negative.   EKGs/Labs/Other Test Reviewed:    EKG:  EKG is not ordered today.    Recent Labs: 08/18/2018: BUN 16; Creatinine, Ser 0.95; Hemoglobin 15.9; Platelets 254; Potassium 4.4; Sodium 139   Recent Lipid Panel Lab Results  Component Value Date/Time   CHOL 92 12/03/2014 01:35 AM   TRIG 65 12/03/2014 01:35 AM   HDL 33 (L) 12/03/2014 01:35 AM   CHOLHDL 2.8 12/03/2014 01:35 AM   LDLCALC 46 12/03/2014 01:35 AM    Physical Exam:    VS:  BP 114/80   Pulse 80   Ht 5\' 8"  (1.727 m)   Wt 222 lb 12.8 oz (101.1 kg)   SpO2 95%   BMI 33.88 kg/m     Wt Readings from Last 3 Encounters:  09/15/18 222 lb 12.8 oz (101.1 kg)  08/26/18 215 lb (97.5 kg)  08/18/18 216 lb 12.8 oz  (98.3 kg)     Physical Exam  Constitutional: He is oriented to person, place, and time. He appears well-developed and well-nourished. No distress.  HENT:  Head: Normocephalic and atraumatic.  Eyes: No scleral icterus.  Neck: Neck supple. No JVD present. No thyromegaly present.  Cardiovascular: Normal rate, regular rhythm, S1 normal and S2 normal.  No murmur heard. Pulmonary/Chest: Breath sounds normal. He has no rales.  Abdominal: Soft. There is no hepatomegaly.  Musculoskeletal:        General: No edema.     Comments: R groin without hematoma or bruit  Lymphadenopathy:    He has no cervical adenopathy.  Neurological: He is alert and oriented to person, place, and time.  Skin: Skin is warm and dry.  Psychiatric: He has a normal mood and affect.    ASSESSMENT & PLAN:    Coronary artery disease involving native coronary artery of native heart without angina pectoris (HCC) S/p CABG in 2006.  Recent Cardiac Catheterization with patent bypass grafts.  His symptoms sound more GI in nature and may be due to cholelithiasis or esophageal spasm.  He has an appointment with GI in Feb 2020.  He is not on ASA as he is on Apixaban.  Continue statin Rx.   Chronic systolic CHF (congestive heart failure) (HCC) EF 45 by LV gram at Cardiac Catheterization.  This is lower than his previous EF.  I have recommended that we continue his ACE inhibitor.  I will stop his as needed metoprolol tartrate and place him on Metoprolol succinate 25 mg QD.  Arrange follow up echocardiogram to confirm his current ejection fraction.    PAF (paroxysmal atrial fibrillation) (HCC) Continue Apixaban.  He takes his beta-blocker as needed for rapid palpitations.  Essential hypertension The patient's blood pressure is controlled on his current regimen.  Continue current therapy.    Cardiac pacemaker in situ Follow up with EP as planned.   Dyslipidemia Continue statin.  Follow up Lipids due next month.   Dispo:   Return in about 6 months (around 03/16/2019) for Routine Follow Up w/ Dr. Ladona Ridgelaylor.   Medication Adjustments/Labs and Tests Ordered: Current medicines are reviewed at length with the  patient today.  Concerns regarding medicines are outlined above.  Tests Ordered: Orders Placed This Encounter  Procedures  . ECHOCARDIOGRAM COMPLETE   Medication Changes: Meds ordered this encounter  Medications  . metoprolol succinate (TOPROL XL) 25 MG 24 hr tablet    Sig: Take 1 tablet (25 mg total) by mouth daily. You may take a extra 1/2 to 1 pill for palpitations    Dispense:  90 tablet    Refill:  3    Signed, Tereso Newcomer, PA-C  09/15/2018 12:10 PM    [facility] [address] [place], [place], [place]  [address] Phone: [phone]; Fax: [phone]

## 2018-09-15 NOTE — Patient Instructions (Signed)
Medication Instructions:  STOP: Metoprolol tartrate  START: Toprol XL 25 mg daily (you may take a extra 1/2 to 1 pill as needed for palpitations   If you need a refill on your cardiac medications before your next appointment, please call your pharmacy.   Lab work: None  If you have labs (blood work) drawn today and your tests are completely normal, you will receive your results only by: [name] MyChart Message (if you have MyChart) OR . A paper copy in the mail If you have any lab test that is abnormal or we need to change your treatment, we will call you to review the results.  Testing/Procedures: Your physician has requested that you have an echocardiogram. Echocardiography is a painless test that uses sound waves to create images of your heart. It provides your doctor with information about the size and shape of your heart and how well your heart's chambers and valves are working. This procedure takes approximately one hour. There are no restrictions for this procedure.    Follow-Up: At [facility], you and your health needs are our priority.  As part of our continuing mission to provide you with exceptional heart care, we have created designated Provider Care Teams.  These Care Teams include your primary Cardiologist (physician) and Advanced Practice Providers (APPs -  Physician Assistants and Nurse Practitioners) who all work together to provide you with the care you need, when you need it. You will need a follow up appointment in 6 months.  Please call our office 2 months in advance to schedule this appointment.  You may see Dr. Ladona Ridgel or one of the following Advanced Practice Providers on your designated Care Team:   Gypsy Balsam, NP . Francis Dowse, PA-C  Any Other Special Instructions Will Be Listed Below (If Applicable).

## 2018-09-17 ENCOUNTER — Ambulatory Visit (INDEPENDENT_AMBULATORY_CARE_PROVIDER_SITE_OTHER): Payer: BLUE CROSS/BLUE SHIELD

## 2018-09-17 DIAGNOSIS — I442 Atrioventricular block, complete: Secondary | ICD-10-CM | POA: Diagnosis not present

## 2018-09-18 ENCOUNTER — Encounter: Payer: Self-pay | Admitting: Physician Assistant

## 2018-09-18 LAB — CUP PACEART REMOTE DEVICE CHECK
Battery Remaining Longevity: 76 mo
Battery Voltage: 3.01 V
Brady Statistic AP VP Percent: 0.91 %
Brady Statistic AP VS Percent: 0.02 %
Brady Statistic AS VP Percent: 92.82 %
Brady Statistic AS VS Percent: 6.24 %
Brady Statistic RA Percent Paced: 0.93 %
Brady Statistic RV Percent Paced: 93.44 %
Date Time Interrogation Session: 20200103011655
Implantable Lead Implant Date: 20161228
Implantable Lead Implant Date: 20161228
Implantable Lead Location: 753859
Implantable Lead Location: 753860
Implantable Lead Model: 5076
Implantable Lead Model: 5076
Implantable Pulse Generator Implant Date: 20161228
Lead Channel Impedance Value: 323 Ohm
Lead Channel Impedance Value: 380 Ohm
Lead Channel Impedance Value: 418 Ohm
Lead Channel Impedance Value: 437 Ohm
Lead Channel Pacing Threshold Amplitude: 0.625 V
Lead Channel Pacing Threshold Amplitude: 1 V
Lead Channel Pacing Threshold Pulse Width: 0.4 ms
Lead Channel Sensing Intrinsic Amplitude: 18.75 mV
Lead Channel Sensing Intrinsic Amplitude: 18.75 mV
Lead Channel Sensing Intrinsic Amplitude: 2.125 mV
Lead Channel Setting Pacing Amplitude: 2.25 V
Lead Channel Setting Pacing Amplitude: 2.5 V
Lead Channel Setting Pacing Pulse Width: 0.4 ms
Lead Channel Setting Sensing Sensitivity: 2.8 mV
MDC IDC MSMT LEADCHNL RA PACING THRESHOLD PULSEWIDTH: 0.4 ms
MDC IDC MSMT LEADCHNL RA SENSING INTR AMPL: 2.125 mV

## 2018-09-18 NOTE — Progress Notes (Signed)
Remote pacemaker transmission.   

## 2018-09-23 ENCOUNTER — Ambulatory Visit: Payer: BLUE CROSS/BLUE SHIELD | Admitting: Gastroenterology

## 2018-10-02 ENCOUNTER — Ambulatory Visit (HOSPITAL_COMMUNITY): Payer: BLUE CROSS/BLUE SHIELD | Attending: Cardiovascular Disease

## 2018-10-02 ENCOUNTER — Other Ambulatory Visit: Payer: Self-pay

## 2018-10-02 DIAGNOSIS — I5022 Chronic systolic (congestive) heart failure: Secondary | ICD-10-CM | POA: Insufficient documentation

## 2018-10-05 ENCOUNTER — Encounter: Payer: Self-pay | Admitting: Physician Assistant

## 2018-10-12 ENCOUNTER — Other Ambulatory Visit: Payer: BLUE CROSS/BLUE SHIELD | Admitting: *Deleted

## 2018-10-12 DIAGNOSIS — Z950 Presence of cardiac pacemaker: Secondary | ICD-10-CM

## 2018-10-12 DIAGNOSIS — I25119 Atherosclerotic heart disease of native coronary artery with unspecified angina pectoris: Secondary | ICD-10-CM

## 2018-10-12 DIAGNOSIS — I10 Essential (primary) hypertension: Secondary | ICD-10-CM

## 2018-10-12 DIAGNOSIS — E785 Hyperlipidemia, unspecified: Secondary | ICD-10-CM

## 2018-10-13 LAB — LIPID PANEL
CHOL/HDL RATIO: 2.6 ratio (ref 0.0–5.0)
Cholesterol, Total: 95 mg/dL — ABNORMAL LOW (ref 100–199)
HDL: 36 mg/dL — ABNORMAL LOW (ref >39)
LDL Calculated: 42 mg/dL (ref 0–99)
Triglycerides: 83 mg/dL (ref 0–149)
VLDL Cholesterol Cal: 17 mg/dL (ref 5–40)

## 2018-10-13 LAB — HEPATIC FUNCTION PANEL
ALBUMIN: 4.5 g/dL (ref 3.8–4.9)
ALT: 40 IU/L (ref 0–44)
AST: 22 IU/L (ref 0–40)
Alkaline Phosphatase: 52 IU/L (ref 39–117)
Bilirubin Total: 0.5 mg/dL (ref 0.0–1.2)
Bilirubin, Direct: 0.18 mg/dL (ref 0.00–0.40)
Total Protein: 6.3 g/dL (ref 6.0–8.5)

## 2018-11-05 ENCOUNTER — Ambulatory Visit: Payer: BLUE CROSS/BLUE SHIELD | Admitting: Gastroenterology

## 2018-11-30 ENCOUNTER — Encounter (INDEPENDENT_AMBULATORY_CARE_PROVIDER_SITE_OTHER): Payer: Self-pay | Admitting: Orthopedic Surgery

## 2018-11-30 ENCOUNTER — Ambulatory Visit (INDEPENDENT_AMBULATORY_CARE_PROVIDER_SITE_OTHER): Payer: BLUE CROSS/BLUE SHIELD

## 2018-11-30 ENCOUNTER — Ambulatory Visit (INDEPENDENT_AMBULATORY_CARE_PROVIDER_SITE_OTHER): Payer: BLUE CROSS/BLUE SHIELD | Admitting: Orthopedic Surgery

## 2018-11-30 ENCOUNTER — Other Ambulatory Visit: Payer: Self-pay

## 2018-11-30 DIAGNOSIS — M25561 Pain in right knee: Secondary | ICD-10-CM

## 2018-12-02 ENCOUNTER — Encounter (INDEPENDENT_AMBULATORY_CARE_PROVIDER_SITE_OTHER): Payer: Self-pay | Admitting: Orthopedic Surgery

## 2018-12-02 NOTE — Progress Notes (Signed)
Office Visit Note   Patient: [name] [name]           Date of Birth: Mar 05, 1956           MRN: [id number] Visit Date: 11/30/2018 Requested by: [name] Lund, PA [address] [place], [place] [address] PCP: [name] [name], [place]  Subjective: Chief Complaint  Patient presents with  . Right Knee - Pain    HPI: [name] [name] is a patient with right knee pain of 3 months duration.  It is worse with any activity.  He works on his feet 12-hour shift.  Try to support brace without much relief.  Denies any weakness or giving way also denies any mechanical symptoms.  He does have a history of an injection in the knee which was not particularly helpful.  The brace does help him that he wears.  He has been taking Neurontin.  He is a recovering substance abuse patient.  He just reports pain.  He works at a [employer] which is very physically demanding.  The pain does not wake him from sleep and he does not report rest pain.              ROS: All systems reviewed are negative as they relate to the chief complaint within the history of present illness.  Patient denies  fevers or chills.   Assessment & Plan: Visit Diagnoses:  1. Right knee pain, unspecified chronicity     Plan: Impression is right knee pain with medial sided symptoms no effusion stable collateral cruciate ligaments and normal radiographs.  I think this could be a medial meniscal tear versus stress fracture.  Plan MRI scan and we will likely check his vitamin D if he does have a stress reaction or stress fracture.  Could be medial meniscal tear as well.  I will see him back after that study  Follow-Up Instructions: Return for after MRI.   Orders:  Orders Placed This Encounter  Procedures  . XR KNEE 3 VIEW RIGHT  . MR Knee Right w/o contrast   No orders of the defined types were placed in this encounter.     Procedures: No procedures performed   Clinical Data: No additional findings.  Objective: Vital Signs:  There were no vitals taken for this visit.  Physical Exam:   Constitutional: Patient appears well-developed HEENT:  Head: Normocephalic Eyes:EOM are normal Neck: Normal [name] of motion Cardiovascular: Normal rate Pulmonary/chest: Effort normal Neurologic: Patient is alert Skin: Skin is warm Psychiatric: Patient has normal mood and affect    Ortho Exam: Ortho exam demonstrates normal gait alignment.  No effusion.  Knee [name] of motion is full.  Has medial greater than lateral joint line tenderness.  Extensor mechanism is intact.  No groin pain on that right-hand side with internal/external rotation of the leg.  Negative McMurray compression testing.  Specialty Comments:  No specialty comments available.  Imaging: No results found.   PMFS History: Patient Active Problem List   Diagnosis Date Noted  . Chronic systolic CHF (congestive heart failure) (HCC) 09/15/2018  . Cardiac pacemaker in situ 08/18/2018  . PAF (paroxysmal atrial fibrillation) (HCC) 08/18/2018  . Second degree AV block 09/13/2015  . Chest pain with moderate risk of acute coronary syndrome   . CHB (complete heart block) (HCC) 12/02/2014  . Memory loss-SVD on MRI March 2015 11/18/2013  . Obstructive sleep apnea   . Second degree AV block, Mobitz type I 03/10/2013  . 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(  HCC)   . Dyslipidemia   . CAD (coronary artery disease)   . Essential hypertension   . Asthma   . Palpitations   . Bipolar 1 disorder (HCC)   . Hx of CABG   . Ejection fraction   . Bradycardia   . Tobacco abuse   . ALLERGIC RHINITIS 05/14/2010  . ASTHMA, UNSPECIFIED, UNSPECIFIED STATUS 07/05/2009   Past Medical History:  Diagnosis Date  . Asthma   . Atrial fibrillation [facility])    new November 2016  . Bipolar 1 disorder (HCC)    "put me on this after I got out of drug rehab 11/18/2005 and couldn't sleep; added anxiety RX and have been sleeping fine since"  . Bradycardia    Very limited Wenkebach on event  recorder November 2010  . CAD (coronary artery disease) 2006   3v CAD >> s/p CABG in [place], patent LIMA to LAD and SVG to diagonal with 40% stenosis 2010 // Nuc 2/14: low risk, EF 56 // LHC 3/16: patent L-LAD, patent S-Dx // Echo 3/16: mild conc LVH, EF 60-65, Gr 1 DD, trivial TR, mild LAE // LHC 12/19: oLAD 100, pLCx 20, S-D1 patent, L-LAD patent, EF 45  . Chronic systolic CHF (congestive heart failure) (HCC) 09/15/2018   EF 45 by LHC in 12/19 // Echo 09/2018: inf HK, EF 45-50, mod LAE  . ED (erectile dysfunction)   . Essential hypertension   . GERD (gastroesophageal reflux disease)   . Gout   . History of stomach ulcers 1980's  . Mixed hyperlipidemia    pt states "they took me off q med they had me on for high cholesterol; my good cholesterol is low" (09/13/2015)  . Myocardial infarction (HCC) 2006  . OSA (obstructive sleep apnea)    patient denies, stated had a sleep study and he was told he did not have    . Pneumonia 2015  . Pneumonia 1990's X 4 in one year   "double pneumonia"  . Presence of permanent cardiac pacemaker   . 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(HCC)     Family History  Problem Relation Age of Onset  . COPD Mother   . Lung cancer Mother   . Stroke Mother   . Heart attack Mother   . Heart disease Father   . Heart attack Father   . Hyperlipidemia Brother   . Hypertension Brother   . Hypertension Brother   . Hypertension Sister     Past Surgical History:  Procedure Laterality Date  . BACK SURGERY    . CARDIAC CATHETERIZATION     "I've had quite a few; don't remember any stents" (09/13/2015)  . CORONARY ANGIOPLASTY    . CORONARY ARTERY BYPASS GRAFT  2006   "CABG X2, in [place]"  . EP IMPLANTABLE DEVICE N/A 09/13/2015   Procedure: Pacemaker Implant;  Surgeon: [name] Range, MD;  Location: [facility] INVASIVE CV LAB;  Service: Cardiovascular;  Laterality: N/A;  . INSERT / REPLACE / REMOVE PACEMAKER  09/13/2015   [employer]tronic  . LEFT HEART CATH AND CORS/GRAFTS  ANGIOGRAPHY N/A 08/26/2018   Procedure: LEFT HEART CATH AND CORS/GRAFTS ANGIOGRAPHY;  Surgeon: [name] Bihari, MD;  Location: [facility] INVASIVE CV LAB;  Service: Cardiovascular;  Laterality: N/A;  . LEFT HEART CATHETERIZATION WITH CORONARY/GRAFT ANGIOGRAM N/A 12/05/2014   Procedure: LEFT HEART CATHETERIZATION WITH Isabel Caprice;  Surgeon: Peter M Swaziland, MD;  Location: [facility] CATH LAB;  Service: Cardiovascular;  Laterality: N/A;  . LEG SURGERY Left 1961   "had 5 boils come  up on my leg; they had to cut them out and put a drainage tube in there"  . LUMBAR DISC SURGERY  X 3   "ruptured disc"  . MAXIMUM ACCESS (MAS)POSTERIOR LUMBAR INTERBODY FUSION (PLIF) 1 LEVEL  ~ 2003   "L4-5; put rods & screws in"   Social History   Occupational History  . Not on file  Tobacco Use  . Smoking status: Former Smoker    Packs/day: 2.00    Years: 17.00    Pack years: 34.00    Types: Cigarettes    Last attempt to quit: 03/13/2017    Years since quitting: 1.7  . Smokeless tobacco: Current User    Types: Snuff  . Tobacco comment: 09/13/2015 "quit chewing tobacco August 2016"  Substance and Sexual Activity  . Alcohol use: Yes    Alcohol/week: 0.0 standard drinks    Comment: 09/13/2015 "got out of alcohol rehab 11/18/2005"  . Drug use: Yes    Comment: 09/13/2015 "got out of rehab for pills 11/18/2005"  . Sexual activity: Yes    Birth control/protection: None

## 2018-12-03 ENCOUNTER — Telehealth (INDEPENDENT_AMBULATORY_CARE_PROVIDER_SITE_OTHER): Payer: Self-pay | Admitting: Orthopedic Surgery

## 2018-12-03 NOTE — Telephone Encounter (Signed)
11/30/2018 ov note faxed to [facility] ,attn: Raynelle Fanning [phone]

## 2018-12-17 ENCOUNTER — Other Ambulatory Visit: Payer: Self-pay

## 2018-12-17 ENCOUNTER — Ambulatory Visit (INDEPENDENT_AMBULATORY_CARE_PROVIDER_SITE_OTHER): Payer: BLUE CROSS/BLUE SHIELD | Admitting: *Deleted

## 2018-12-17 DIAGNOSIS — I442 Atrioventricular block, complete: Secondary | ICD-10-CM | POA: Diagnosis not present

## 2018-12-18 ENCOUNTER — Telehealth: Payer: Self-pay

## 2018-12-18 LAB — CUP PACEART REMOTE DEVICE CHECK
Battery Remaining Longevity: 71 mo
Battery Voltage: 3 V
Brady Statistic AP VP Percent: 2.83 %
Brady Statistic AP VS Percent: 0.02 %
Brady Statistic AS VP Percent: 89.09 %
Brady Statistic AS VS Percent: 8.06 %
Brady Statistic RA Percent Paced: 2.79 %
Brady Statistic RV Percent Paced: 91.45 %
Date Time Interrogation Session: 20200403153544
Implantable Lead Implant Date: 20161228
Implantable Lead Implant Date: 20161228
Implantable Lead Location: 753859
Implantable Lead Location: 753860
Implantable Lead Model: 5076
Implantable Lead Model: 5076
Implantable Pulse Generator Implant Date: 20161228
Lead Channel Impedance Value: 323 Ohm
Lead Channel Impedance Value: 361 Ohm
Lead Channel Impedance Value: 437 Ohm
Lead Channel Impedance Value: 437 Ohm
Lead Channel Pacing Threshold Amplitude: 0.625 V
Lead Channel Pacing Threshold Amplitude: 1.375 V
Lead Channel Pacing Threshold Pulse Width: 0.4 ms
Lead Channel Pacing Threshold Pulse Width: 0.4 ms
Lead Channel Sensing Intrinsic Amplitude: 11.625 mV
Lead Channel Sensing Intrinsic Amplitude: 11.625 mV
Lead Channel Sensing Intrinsic Amplitude: 2.375 mV
Lead Channel Sensing Intrinsic Amplitude: 2.375 mV
Lead Channel Setting Pacing Amplitude: 2.5 V
Lead Channel Setting Pacing Amplitude: 2.75 V
Lead Channel Setting Pacing Pulse Width: 0.4 ms
Lead Channel Setting Sensing Sensitivity: 2.8 mV

## 2018-12-18 NOTE — Telephone Encounter (Signed)
Spoke with patient to remind of missed remote transmission 

## 2018-12-18 NOTE — Telephone Encounter (Signed)
Left message for patient to remind of missed remote transmission.  

## 2018-12-24 ENCOUNTER — Encounter: Payer: Self-pay | Admitting: Cardiology

## 2018-12-24 NOTE — Progress Notes (Signed)
Remote pacemaker transmission.   

## 2019-02-03 ENCOUNTER — Telehealth: Payer: Self-pay | Admitting: Orthopedic Surgery

## 2019-02-03 NOTE — Telephone Encounter (Signed)
Patient called advised due to him having a pace maker his MRI will need to be scheduled at [facility]. The number to contact patient is [phone]

## 2019-02-04 NOTE — Telephone Encounter (Signed)
Order changed to [facility]

## 2019-03-18 ENCOUNTER — Telehealth: Payer: Self-pay

## 2019-03-18 ENCOUNTER — Ambulatory Visit (INDEPENDENT_AMBULATORY_CARE_PROVIDER_SITE_OTHER): Payer: BC Managed Care – PPO | Admitting: *Deleted

## 2019-03-18 DIAGNOSIS — I442 Atrioventricular block, complete: Secondary | ICD-10-CM | POA: Diagnosis not present

## 2019-03-18 LAB — CUP PACEART REMOTE DEVICE CHECK
Battery Remaining Longevity: 69 mo
Battery Voltage: 3 V
Brady Statistic AP VP Percent: 3.49 %
Brady Statistic AP VS Percent: 0.02 %
Brady Statistic AS VP Percent: 92.22 %
Brady Statistic AS VS Percent: 4.27 %
Brady Statistic RA Percent Paced: 3.48 %
Brady Statistic RV Percent Paced: 95.28 %
Date Time Interrogation Session: 20200702164707
Implantable Lead Implant Date: 20161228
Implantable Lead Implant Date: 20161228
Implantable Lead Location: 753859
Implantable Lead Location: 753860
Implantable Lead Model: 5076
Implantable Lead Model: 5076
Implantable Pulse Generator Implant Date: 20161228
Lead Channel Impedance Value: 304 Ohm
Lead Channel Impedance Value: 418 Ohm
Lead Channel Impedance Value: 418 Ohm
Lead Channel Impedance Value: 475 Ohm
Lead Channel Pacing Threshold Amplitude: 0.75 V
Lead Channel Pacing Threshold Amplitude: 1.25 V
Lead Channel Pacing Threshold Pulse Width: 0.4 ms
Lead Channel Pacing Threshold Pulse Width: 0.4 ms
Lead Channel Sensing Intrinsic Amplitude: 1.625 mV
Lead Channel Sensing Intrinsic Amplitude: 16 mV
Lead Channel Setting Pacing Amplitude: 2.5 V
Lead Channel Setting Pacing Amplitude: 2.5 V
Lead Channel Setting Pacing Pulse Width: 0.4 ms
Lead Channel Setting Sensing Sensitivity: 2.8 mV

## 2019-03-18 NOTE — Telephone Encounter (Signed)
Spoke with patient to remind of missed remote transmission 

## 2019-03-25 ENCOUNTER — Encounter: Payer: Self-pay | Admitting: Cardiology

## 2019-03-25 NOTE — Progress Notes (Signed)
Remote pacemaker transmission.   

## 2019-06-17 ENCOUNTER — Ambulatory Visit (INDEPENDENT_AMBULATORY_CARE_PROVIDER_SITE_OTHER): Payer: BC Managed Care – PPO | Admitting: *Deleted

## 2019-06-17 DIAGNOSIS — I442 Atrioventricular block, complete: Secondary | ICD-10-CM

## 2019-06-19 LAB — CUP PACEART REMOTE DEVICE CHECK
Battery Remaining Longevity: 68 mo
Battery Voltage: 3 V
Brady Statistic AP VP Percent: 5.23 %
Brady Statistic AP VS Percent: 0.07 %
Brady Statistic AS VP Percent: 89.61 %
Brady Statistic AS VS Percent: 5.09 %
Brady Statistic RA Percent Paced: 5.26 %
Brady Statistic RV Percent Paced: 94.4 %
Date Time Interrogation Session: 20201002151448
Implantable Lead Implant Date: 20161228
Implantable Lead Implant Date: 20161228
Implantable Lead Location: 753859
Implantable Lead Location: 753860
Implantable Lead Model: 5076
Implantable Lead Model: 5076
Implantable Pulse Generator Implant Date: 20161228
Lead Channel Impedance Value: 342 Ohm
Lead Channel Impedance Value: 399 Ohm
Lead Channel Impedance Value: 456 Ohm
Lead Channel Impedance Value: 456 Ohm
Lead Channel Pacing Threshold Amplitude: 0.75 V
Lead Channel Pacing Threshold Amplitude: 1.125 V
Lead Channel Pacing Threshold Pulse Width: 0.4 ms
Lead Channel Pacing Threshold Pulse Width: 0.4 ms
Lead Channel Sensing Intrinsic Amplitude: 13.25 mV
Lead Channel Sensing Intrinsic Amplitude: 13.25 mV
Lead Channel Sensing Intrinsic Amplitude: 2 mV
Lead Channel Sensing Intrinsic Amplitude: 2 mV
Lead Channel Setting Pacing Amplitude: 2.25 V
Lead Channel Setting Pacing Amplitude: 2.5 V
Lead Channel Setting Pacing Pulse Width: 0.4 ms
Lead Channel Setting Sensing Sensitivity: 2.8 mV

## 2019-06-23 ENCOUNTER — Encounter: Payer: Self-pay | Admitting: Cardiology

## 2019-06-23 NOTE — Progress Notes (Signed)
Remote pacemaker transmission.   

## 2019-07-11 ENCOUNTER — Other Ambulatory Visit: Payer: Self-pay | Admitting: Internal Medicine

## 2019-07-12 NOTE — Telephone Encounter (Signed)
Pt last saw Richardson Dopp, PA on 09/15/18, last labs 08/18/18 Creat 0.95, age 63, weight 101.1kg, based on specified criteria pt is on appropriate dosage of Eliquis 5mg  BID.  Will refill rx.

## 2019-08-09 ENCOUNTER — Other Ambulatory Visit: Payer: Self-pay | Admitting: Physician Assistant

## 2019-09-16 ENCOUNTER — Ambulatory Visit (INDEPENDENT_AMBULATORY_CARE_PROVIDER_SITE_OTHER): Payer: BC Managed Care – PPO | Admitting: *Deleted

## 2019-09-16 DIAGNOSIS — I442 Atrioventricular block, complete: Secondary | ICD-10-CM | POA: Diagnosis not present

## 2019-09-16 LAB — CUP PACEART REMOTE DEVICE CHECK
Battery Remaining Longevity: 68 mo
Battery Voltage: 3 V
Brady Statistic AP VP Percent: 6.37 %
Brady Statistic AP VS Percent: 0.02 %
Brady Statistic AS VP Percent: 88.99 %
Brady Statistic AS VS Percent: 4.61 %
Brady Statistic RA Percent Paced: 6.3 %
Brady Statistic RV Percent Paced: 94.92 %
Date Time Interrogation Session: 20201231162228
Implantable Lead Implant Date: 20161228
Implantable Lead Implant Date: 20161228
Implantable Lead Location: 753859
Implantable Lead Location: 753860
Implantable Lead Model: 5076
Implantable Lead Model: 5076
Implantable Pulse Generator Implant Date: 20161228
Lead Channel Impedance Value: 323 Ohm
Lead Channel Impedance Value: 361 Ohm
Lead Channel Impedance Value: 437 Ohm
Lead Channel Impedance Value: 456 Ohm
Lead Channel Pacing Threshold Amplitude: 0.75 V
Lead Channel Pacing Threshold Amplitude: 1.125 V
Lead Channel Pacing Threshold Pulse Width: 0.4 ms
Lead Channel Pacing Threshold Pulse Width: 0.4 ms
Lead Channel Sensing Intrinsic Amplitude: 15.75 mV
Lead Channel Sensing Intrinsic Amplitude: 15.75 mV
Lead Channel Sensing Intrinsic Amplitude: 2.25 mV
Lead Channel Sensing Intrinsic Amplitude: 2.25 mV
Lead Channel Setting Pacing Amplitude: 2.25 V
Lead Channel Setting Pacing Amplitude: 2.5 V
Lead Channel Setting Pacing Pulse Width: 0.4 ms
Lead Channel Setting Sensing Sensitivity: 2.8 mV

## 2019-09-29 ENCOUNTER — Other Ambulatory Visit: Payer: Self-pay | Admitting: Physician Assistant

## 2019-09-30 MED ORDER — METOPROLOL SUCCINATE ER 25 MG PO TB24: 25.0000 mg | ORAL_TABLET | Freq: Every day | ORAL | 0 refills | Status: DC

## 2019-10-20 ENCOUNTER — Other Ambulatory Visit: Payer: Self-pay | Admitting: Physician Assistant

## 2019-11-05 ENCOUNTER — Other Ambulatory Visit: Payer: Self-pay | Admitting: Physician Assistant

## 2019-11-05 ENCOUNTER — Other Ambulatory Visit: Payer: Self-pay | Admitting: Internal Medicine

## 2019-11-05 MED ORDER — ROSUVASTATIN CALCIUM 10 MG PO TABS: ORAL_TABLET | ORAL | 0 refills | Status: DC

## 2019-11-11 ENCOUNTER — Other Ambulatory Visit: Payer: Self-pay

## 2019-11-11 MED ORDER — METOPROLOL SUCCINATE ER 25 MG PO TB24: 25.0000 mg | ORAL_TABLET | Freq: Every day | ORAL | 0 refills | Status: DC

## 2019-11-24 ENCOUNTER — Other Ambulatory Visit: Payer: Self-pay | Admitting: Internal Medicine

## 2019-12-07 ENCOUNTER — Other Ambulatory Visit: Payer: Self-pay | Admitting: Internal Medicine

## 2019-12-07 ENCOUNTER — Encounter: Payer: BC Managed Care – PPO | Admitting: Physician Assistant

## 2019-12-08 NOTE — Progress Notes (Addendum)
Cardiology Office Note Date:  12/09/2019  Patient ID:  [name], [name] 06-07-1956, MRN [id number] PCP:  Lois Huxley, PA  Cardiologist:  Dr. Lovena Le     Chief Complaint: annual visit  History of Present Illness: [name] [name] is a 64 y.o. male with history of CAD (CABG 2006, last LHC Jun 2019 noted below, medical therapy recommended), Bioplar I disorder, HTN, HLD, CHB w/PPM, AFib, Chronic CHF, CM.  He comes in today to be seen for Dr. Lovena Le, last seen by Kathleen Argue Dec 2019, at that time having some CP that was suspect to be GI, pending evaluation.  PRN metoprolol changed to daily with plans for re-eval of his echo TTE remained with LVEF 45-50%, no WMA, LA was mod dilated  He had COVID mid Feb, mild with loss of taste/smell, no respiratory symptoms, self quarantined.   He is doing well.  No cardiac awareness, no CP or palpitations.  He denies any physical limitations outside of his back.  His job for >20 years at [employer], very labor intensive he reports has taken a toll on his back.   No dizzy spells, near syncope or syncope No SOB, no DOE, denies symptoms of PND or orthopnea No bleeding or signs of bleeding  He sees and has labs with his PMD every 6 months typically.   Device information MDT dual chamber PPM, implanted 09/13/2015   Past Medical History:  Diagnosis Date  . ALLERGIC RHINITIS 05/14/2010   Qualifier: Diagnosis of  By: Claybon Jabs PA, Dawn    . Asthma   . Asthma   . Atrial fibrillation [facility])    new November 2016  . Bipolar 1 disorder ([place])    "put me on this after I got out of drug rehab 11/18/2005 and couldn't sleep; added anxiety RX and have been sleeping fine since"  . Bradycardia    Very limited Wenkebach on event recorder November 2010  . CAD (coronary artery disease) 2006   3v CAD >> s/p CABG in [place], patent LIMA to LAD and SVG to diagonal with 40% stenosis 2010 // Nuc 2/14: low risk, EF 56 // LHC 3/16: patent L-LAD, patent  S-Dx // Echo 3/16: mild conc LVH, EF 60-65, Gr 1 DD, trivial TR, mild LAE // LHC 12/19: oLAD 100, pLCx 20, S-D1 patent, L-LAD patent, EF 45  . Cardiac pacemaker in situ 08/18/2018  . CHB (complete heart block) ([place]) 12/02/2014  . Chest pain with moderate risk of acute coronary syndrome   . Chronic systolic CHF (congestive heart failure) ([place]) 09/15/2018   EF 45 by LHC in 12/19 // Echo 09/2018: inf HK, EF 45-50, mod LAE  . Dyslipidemia    mixed   . ED (erectile dysfunction)   . Ejection fraction    EF 50%, catheterization, September, 2010   //   EF 60-65%, echo, December 03, 2014   . Essential hypertension   . GERD (gastroesophageal reflux disease)   . Gout   . History of stomach ulcers 1980's  . Hx of CABG    2006, [place]   . Memory loss-SVD on MRI March 2015 11/18/2013  . Mixed hyperlipidemia    pt states "they took me off q med they had me on for high cholesterol; my good cholesterol is low" (09/13/2015)  . Myocardial infarction ([place]) 2006  . OSA (obstructive sleep apnea)    patient denies, stated had a sleep study and he was told he did not have    .  PAF (paroxysmal atrial fibrillation) (HCC) 08/18/2018  . Palpitations    Event recorder November, 2010, no significant arrhythmias   . Pneumonia 2015  . Pneumonia 1990's X 4 in one year   "double pneumonia"  . Presence of permanent cardiac pacemaker   . Second degree AV block 09/13/2015  . Second degree AV block, Mobitz type I 03/10/2013   Patient had Mobitz 1 heart block that was asymptomatic. This resolved off beta blockade while in the hospital March, 2016. Plan to keep the patient off beta blockers. Patient will be seen in follow-up by Dr. Ladona Ridgel and the [place] office.   . 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[facility])     Past Surgical History:  Procedure Laterality Date  . BACK SURGERY    . CARDIAC CATHETERIZATION     "I've had quite a few; don't remember any stents" (09/13/2015)  . CORONARY ANGIOPLASTY    . CORONARY ARTERY BYPASS  GRAFT  2006   "CABG X2, in [place]"  . EP IMPLANTABLE DEVICE N/A 09/13/2015   Procedure: Pacemaker Implant;  Surgeon: Hillis Range, MD;  Location: [facility] INVASIVE CV LAB;  Service: Cardiovascular;  Laterality: N/A;  . INSERT / REPLACE / REMOVE PACEMAKER  09/13/2015   [employer]  . LEFT HEART CATH AND CORS/GRAFTS ANGIOGRAPHY N/A 08/26/2018   Procedure: LEFT HEART CATH AND CORS/GRAFTS ANGIOGRAPHY;  Surgeon: Lennette Bihari, MD;  Location: [facility] INVASIVE CV LAB;  Service: Cardiovascular;  Laterality: N/A;  . LEFT HEART CATHETERIZATION WITH CORONARY/GRAFT ANGIOGRAM N/A 12/05/2014   Procedure: LEFT HEART CATHETERIZATION WITH Isabel Caprice;  Surgeon: Peter M Swaziland, MD;  Location: [facility] CATH LAB;  Service: Cardiovascular;  Laterality: N/A;  . LEG SURGERY Left 1961   "had 5 boils come up on my leg; they had to cut them out and put a drainage tube in there"  . LUMBAR DISC SURGERY  X 3   "ruptured disc"  . MAXIMUM ACCESS (MAS)POSTERIOR LUMBAR INTERBODY FUSION (PLIF) 1 LEVEL  ~ 2003   "L4-5; put rods & screws in"    Current Outpatient Medications  Medication Sig Dispense Refill  . allopurinol (ZYLOPRIM) 300 MG tablet Take 300 mg by mouth daily.      . beta carotene w/minerals (OCUVITE) tablet Take 1 tablet by mouth daily.    [name] COLCRYS 0.6 MG tablet Take 0.6 mg by mouth daily as needed (Gout).   0  . ELIQUIS 5 MG TABS tablet TAKE 1 TABLET(5 MG) BY MOUTH TWICE DAILY 60 tablet 5  . furosemide (LASIX) 20 MG tablet TAKE 1 TABLET(20 MG) BY MOUTH DAILY AS NEEDED FOR SWELLING 90 tablet 3  . gabapentin (NEURONTIN) 300 MG capsule Take 300 mg by mouth 4 (four) times daily.   5  . magnesium oxide (MAG-OX) 400 MG tablet Take 200 mg by mouth 2 (two) times daily.    . metoprolol succinate (TOPROL-XL) 25 MG 24 hr tablet TAKE 1 TABLET BY MOUTH EVERY DAY, MAY TAKE 1/2 TO 1 TABLET EXTRA FOR PALITATTIONS 30 tablet 0  . nitroGLYCERIN (NITROLINGUAL) 0.4 MG/SPRAY spray USE 1 SPRAY UNDER THE TONGUE EVERY 5 MINUTES AS  NEEDED FOR CHEST PAIN 12 g 3  . omeprazole (PRILOSEC) 20 MG capsule Take 20 mg by mouth daily.      [name] OVER THE COUNTER MEDICATION Take 1 tablet by mouth daily. Allergy relief    . QUEtiapine (SEROQUEL) 300 MG tablet Take 300 mg by mouth at bedtime.      . quinapril (ACCUPRIL) 40 MG tablet Take 1 tablet (40 mg  total) by mouth daily. 90 tablet 3  . rosuvastatin (CRESTOR) 10 MG tablet TAKE 1 TABLET BY MOUTH EVERY DAY. Please keep upcoming appt for further refills 90 tablet 0  . tamsulosin (FLOMAX) 0.4 MG CAPS capsule Take 0.4 mg by mouth at bedtime.   11  . temazepam (RESTORIL) 30 MG capsule Take 30 mg by mouth at bedtime.   5   No current facility-administered medications for this visit.    Allergies:   Sulfonamide derivatives   Social History:  The patient  reports that he quit smoking about 2 years ago. His smoking use included cigarettes. He has a 34.00 pack-year smoking history. His smokeless tobacco use includes snuff. He reports current alcohol use. He reports current drug use.   Family History:  The patient's family history includes COPD in his mother; Heart attack in his father and mother; Heart disease in his father; Hyperlipidemia in his brother; Hypertension in his brother, brother, and sister; Lung cancer in his mother; Stroke in his mother.  ROS:  Please see the history of present illness.  All other systems are reviewed and otherwise negative.   PHYSICAL EXAM:  VS:  BP 124/80   Pulse 79   Ht 5\' 8"  (1.727 m)   Wt 249 lb (112.9 kg)   BMI 37.86 kg/m  BMI: Body mass index is 37.86 kg/m. Well nourished, well developed, in no acute distress  HEENT: normocephalic, atraumatic  Neck: no JVD, carotid bruits or masses Cardiac:  RRR; no significant murmurs, no rubs, or gallops Lungs: CTA b/l, no wheezing, rhonchi or rales  Abd: soft, nontende MS: no deformity or atrophy Ext: no edema  Skin: warm and dry, no rash Neuro:  No gross deficits appreciated Psych: euthymic mood, full  affect  PPM site is stable, no tethering or discomfort   EKG:  Done today and reviewed by myself shows  SR/VP   PPM interrogation done today and reviewed by myself:  Battery and lead measurements are good AFib burden 1.5% 94.8%VP   10/02/2018: TTE Study Conclusions  - Left ventricle: Abnormal septal motion inferior basal  hypokinesis. The cavity size was mildly dilated. Systolic  function was mildly reduced. The estimated ejection fraction was  in the range of 45% to 50%. Wall motion was normal; there were no  regional wall motion abnormalities. The study is not technically  sufficient to allow evaluation of LV diastolic function.  - Left atrium: The atrium was moderately dilated.  - Atrial septum: No defect or patent foramen ovale was identified.   Cardiac catheterization 08/26/2018 LAD ostial 100 LCx proximal 20 SVG-D1 patent with mid 15 LIMA-LAD patent EF 45 with mid to distal mild anterolateral hypocontractility     Nuclear stress test 03/06/2018 [facility] [place] [place]) Normal perfusion, no ischemia, EF 62  Echo 12/03/2014 Mild concentric LVH, EF 60-65, normal wall motion, grade 1 diastolic dysfunction, mild LAE, trivial TR  Cardiac catheterization 12/05/2014 Left mainstem:Normal Left anterior descending (LAD):100% occlusion proximally. Left circumflex (LCx):Normal Right coronary artery (RCA):Normal SVG to the diagonal is widely patent.  LIMA to the LAD is widely patent.  Left ventriculography: Left ventricular systolic function is normal, LVEF is estimated at 55-65%, there is no significant mitral regurgitation  Nuclear stress test 11/04/2012 Low risk stress nuclear study with a small, moderate intensity, partially reversible anterior defect suggestive of soft tissue attenuation; cannot exclude mild anterior ischemia. LV Ejection Fraction: 56%. LV Wall Motion: NL LV Function; NL Wall Motion    Recent Labs:  No results found for  requested labs within last 8760 hours.  No results found for requested labs within last 8760 hours.   CrCl cannot be calculated (Patient's most recent lab result is older than the maximum 21 days allowed.).   Wt Readings from Last 3 Encounters:  12/09/19 249 lb (112.9 kg)  09/15/18 222 lb 12.8 oz (101.1 kg)  08/26/18 215 lb (97.5 kg)     Other studies reviewed: Additional studies/records reviewed today include: summarized above  ASSESSMENT AND PLAN:  1. PPM     Intact function, no programming changes made  2. CAD     No anginal symptoms     On BB, statin, no ASA w/eliquis  3. Chronic CHF 4. 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 HTN     Looks good  6. HLD     Labs with his PMD  7. Paroxysmal Afib     CHA2DS2Vasc is 3, on Eliquis, appropriately dosed     1.5% burden, rate controlled/largely paced, asymptomatic    Disposition: F/u with remotes every 3 months, in-clinic in a year, sooner if needed  Current medicines are reviewed at length with the patient today.  The patient did not have any concerns regarding medicines.  Norma Fredrickson, PA-C 12/09/2019 10:09 AM     [facility] [address] [place] [address] [phone] (office)  [phone] (fax)

## 2019-12-09 ENCOUNTER — Other Ambulatory Visit: Payer: Self-pay

## 2019-12-09 ENCOUNTER — Ambulatory Visit: Payer: BC Managed Care – PPO | Admitting: Physician Assistant

## 2019-12-09 VITALS — BP 124/80 | HR 79 | Ht 68.0 in | Wt 249.0 lb

## 2019-12-09 DIAGNOSIS — I480 Paroxysmal atrial fibrillation: Secondary | ICD-10-CM | POA: Diagnosis not present

## 2019-12-09 DIAGNOSIS — I5022 Chronic systolic (congestive) heart failure: Secondary | ICD-10-CM

## 2019-12-09 DIAGNOSIS — Z950 Presence of cardiac pacemaker: Secondary | ICD-10-CM

## 2019-12-09 DIAGNOSIS — I10 Essential (primary) hypertension: Secondary | ICD-10-CM

## 2019-12-09 DIAGNOSIS — I428 Other cardiomyopathies: Secondary | ICD-10-CM

## 2019-12-09 DIAGNOSIS — I2510 Atherosclerotic heart disease of native coronary artery without angina pectoris: Secondary | ICD-10-CM

## 2019-12-09 DIAGNOSIS — E782 Mixed hyperlipidemia: Secondary | ICD-10-CM

## 2019-12-09 NOTE — Patient Instructions (Signed)

## 2019-12-16 ENCOUNTER — Ambulatory Visit (INDEPENDENT_AMBULATORY_CARE_PROVIDER_SITE_OTHER): Payer: BC Managed Care – PPO | Admitting: *Deleted

## 2019-12-16 DIAGNOSIS — I442 Atrioventricular block, complete: Secondary | ICD-10-CM | POA: Diagnosis not present

## 2019-12-18 LAB — CUP PACEART REMOTE DEVICE CHECK
Battery Remaining Longevity: 59 mo
Battery Voltage: 3 V
Brady Statistic AP VP Percent: 4.81 %
Brady Statistic AP VS Percent: 0 %
Brady Statistic AS VP Percent: 91.48 %
Brady Statistic AS VS Percent: 3.71 %
Brady Statistic RA Percent Paced: 4.79 %
Brady Statistic RV Percent Paced: 96.07 %
Date Time Interrogation Session: 20210402132251
Implantable Lead Implant Date: 20161228
Implantable Lead Implant Date: 20161228
Implantable Lead Location: 753859
Implantable Lead Location: 753860
Implantable Lead Model: 5076
Implantable Lead Model: 5076
Implantable Pulse Generator Implant Date: 20161228
Lead Channel Impedance Value: 342 Ohm
Lead Channel Impedance Value: 380 Ohm
Lead Channel Impedance Value: 456 Ohm
Lead Channel Impedance Value: 456 Ohm
Lead Channel Pacing Threshold Amplitude: 0.625 V
Lead Channel Pacing Threshold Amplitude: 1.25 V
Lead Channel Pacing Threshold Pulse Width: 0.4 ms
Lead Channel Pacing Threshold Pulse Width: 0.4 ms
Lead Channel Sensing Intrinsic Amplitude: 1.75 mV
Lead Channel Sensing Intrinsic Amplitude: 1.75 mV
Lead Channel Sensing Intrinsic Amplitude: 12.625 mV
Lead Channel Sensing Intrinsic Amplitude: 12.625 mV
Lead Channel Setting Pacing Amplitude: 2.5 V
Lead Channel Setting Pacing Amplitude: 2.75 V
Lead Channel Setting Pacing Pulse Width: 0.4 ms
Lead Channel Setting Sensing Sensitivity: 2.8 mV

## 2019-12-21 ENCOUNTER — Encounter (HOSPITAL_COMMUNITY): Payer: Self-pay

## 2019-12-21 ENCOUNTER — Other Ambulatory Visit: Payer: Self-pay

## 2019-12-21 ENCOUNTER — Emergency Department (HOSPITAL_COMMUNITY): Payer: BC Managed Care – PPO

## 2019-12-21 ENCOUNTER — Emergency Department (HOSPITAL_COMMUNITY)
Admission: EM | Admit: 2019-12-21 | Discharge: 2019-12-22 | Disposition: A | Discharge status: Home / Self Care | Payer: BC Managed Care – PPO | Attending: Emergency Medicine | Admitting: Emergency Medicine

## 2019-12-21 DIAGNOSIS — I5022 Chronic systolic (congestive) heart failure: Secondary | ICD-10-CM | POA: Diagnosis not present

## 2019-12-21 DIAGNOSIS — I2510 Atherosclerotic heart disease of native coronary artery without angina pectoris: Secondary | ICD-10-CM | POA: Insufficient documentation

## 2019-12-21 DIAGNOSIS — Z79899 Other long term (current) drug therapy: Secondary | ICD-10-CM | POA: Diagnosis not present

## 2019-12-21 DIAGNOSIS — Z950 Presence of cardiac pacemaker: Secondary | ICD-10-CM | POA: Diagnosis not present

## 2019-12-21 DIAGNOSIS — Z951 Presence of aortocoronary bypass graft: Secondary | ICD-10-CM | POA: Insufficient documentation

## 2019-12-21 DIAGNOSIS — I110 Hypertensive heart disease with heart failure: Secondary | ICD-10-CM | POA: Diagnosis not present

## 2019-12-21 DIAGNOSIS — Z87891 Personal history of nicotine dependence: Secondary | ICD-10-CM | POA: Insufficient documentation

## 2019-12-21 DIAGNOSIS — R109 Unspecified abdominal pain: Secondary | ICD-10-CM | POA: Insufficient documentation

## 2019-12-21 DIAGNOSIS — J45909 Unspecified asthma, uncomplicated: Secondary | ICD-10-CM | POA: Insufficient documentation

## 2019-12-21 DIAGNOSIS — Z7901 Long term (current) use of anticoagulants: Secondary | ICD-10-CM | POA: Diagnosis not present

## 2019-12-21 LAB — CBC WITH DIFFERENTIAL/PLATELET
Abs Immature Granulocytes: 0.04 10*3/uL (ref 0.00–0.07)
Basophils Absolute: 0.1 10*3/uL (ref 0.0–0.1)
Basophils Relative: 2 %
Eosinophils Absolute: 0.1 10*3/uL (ref 0.0–0.5)
Eosinophils Relative: 1 %
HCT: 48.3 % (ref 39.0–52.0)
Hemoglobin: 17.1 g/dL — ABNORMAL HIGH (ref 13.0–17.0)
Immature Granulocytes: 0 %
Lymphocytes Relative: 17 %
Lymphs Abs: 1.6 10*3/uL (ref 0.7–4.0)
MCH: 32.1 pg (ref 26.0–34.0)
MCHC: 35.4 g/dL (ref 30.0–36.0)
MCV: 90.6 fL (ref 80.0–100.0)
Monocytes Absolute: 0.8 10*3/uL (ref 0.1–1.0)
Monocytes Relative: 8 %
Neutro Abs: 6.6 10*3/uL (ref 1.7–7.7)
Neutrophils Relative %: 72 %
Platelets: 249 10*3/uL (ref 150–400)
RBC: 5.33 MIL/uL (ref 4.22–5.81)
RDW: 14 % (ref 11.5–15.5)
WBC: 9.1 10*3/uL (ref 4.0–10.5)
nRBC: 0 % (ref 0.0–0.2)

## 2019-12-21 LAB — URINALYSIS, ROUTINE W REFLEX MICROSCOPIC
Bacteria, UA: NONE SEEN
Bilirubin Urine: NEGATIVE
Glucose, UA: NEGATIVE mg/dL
Hgb urine dipstick: NEGATIVE
Ketones, ur: NEGATIVE mg/dL
Nitrite: NEGATIVE
Protein, ur: NEGATIVE mg/dL
Specific Gravity, Urine: 1.013 (ref 1.005–1.030)
pH: 6 (ref 5.0–8.0)

## 2019-12-21 LAB — COMPREHENSIVE METABOLIC PANEL
ALT: 41 U/L (ref 0–44)
AST: 24 U/L (ref 15–41)
Albumin: 5 g/dL (ref 3.5–5.0)
Alkaline Phosphatase: 51 U/L (ref 38–126)
Anion gap: 9 (ref 5–15)
BUN: 13 mg/dL (ref 8–23)
CO2: 27 mmol/L (ref 22–32)
Calcium: 9.8 mg/dL (ref 8.9–10.3)
Chloride: 102 mmol/L (ref 98–111)
Creatinine, Ser: 0.88 mg/dL (ref 0.61–1.24)
GFR calc Af Amer: >60 mL/min (ref >60)
GFR calc non Af Amer: >60 mL/min (ref >60)
Glucose, Bld: 100 mg/dL — ABNORMAL HIGH (ref 70–99)
Potassium: 4.2 mmol/L (ref 3.5–5.1)
Sodium: 138 mmol/L (ref 135–145)
Total Bilirubin: 0.7 mg/dL (ref 0.3–1.2)
Total Protein: 7.5 g/dL (ref 6.5–8.1)

## 2019-12-21 LAB — D-DIMER, QUANTITATIVE: D-Dimer, Quant: 0.3 ug/mL-FEU (ref 0.00–0.50)

## 2019-12-21 MED ORDER — SODIUM CHLORIDE 0.9 % IV BOLUS
1000.0000 mL | Freq: Once | INTRAVENOUS | Status: AC
  Administered 2019-12-21: 1000 mL via INTRAVENOUS

## 2019-12-21 MED ORDER — HYDROMORPHONE HCL 1 MG/ML IJ SOLN
1.0000 mg | Freq: Once | INTRAMUSCULAR | Status: AC
  Administered 2019-12-21: 1 mg via INTRAVENOUS
  Filled 2019-12-21: qty 1

## 2019-12-21 MED ORDER — ONDANSETRON HCL 4 MG/2ML IJ SOLN
4.0000 mg | Freq: Once | INTRAMUSCULAR | Status: AC
  Administered 2019-12-21: 4 mg via INTRAVENOUS
  Filled 2019-12-21: qty 2

## 2019-12-21 MED ORDER — CYCLOBENZAPRINE HCL 10 MG PO TABS: 10.0000 mg | ORAL_TABLET | Freq: Three times a day (TID) | ORAL | 0 refills | Status: DC | PRN

## 2019-12-21 MED ORDER — KETOROLAC TROMETHAMINE 30 MG/ML IJ SOLN
30.0000 mg | Freq: Once | INTRAMUSCULAR | Status: AC
  Administered 2019-12-21: 30 mg via INTRAVENOUS
  Filled 2019-12-21: qty 1

## 2019-12-21 NOTE — ED Triage Notes (Signed)
Pt presents to ED with complaints of right sided flank pain since today. Pt states also having difficulty urinating.

## 2019-12-21 NOTE — Discharge Instructions (Addendum)
Follow-up with your urologist next week and follow-up with your family doctor for recheck of your discomfort recheck of your discomfort

## 2019-12-21 NOTE — ED Notes (Signed)
Pt transported to CT ?

## 2019-12-22 NOTE — ED Provider Notes (Signed)
[facility] EMERGENCY DEPARTMENT Provider Note   CSN: [id number] Arrival date & time: 12/21/19  1659     History Chief Complaint  Patient presents with  . Flank Pain    [name] [name] is a 64 y.o. male.  Patient complains of right-sided flank pain.  He also states he has mild difficulty urination  The history is provided by the patient and a relative. No language interpreter was used.  Flank Pain This is a new problem. The current episode started less than 1 hour ago. The problem occurs constantly. The problem has not changed since onset.Pertinent negatives include no chest pain, no abdominal pain and no headaches. Nothing aggravates the symptoms. Nothing relieves the symptoms. He has tried nothing for the symptoms. The treatment provided no relief.       Past Medical History:  Diagnosis Date  . ALLERGIC RHINITIS 05/14/2010   Qualifier: Diagnosis of  By: Garnette Czech PA, Dawn    . Asthma   . Asthma   . Atrial fibrillation [facility])    new November 2016  . Bipolar 1 disorder (HCC)    "put me on this after I got out of drug rehab 11/18/2005 and couldn't sleep; added anxiety RX and have been sleeping fine since"  . Bradycardia    Very limited Wenkebach on event recorder November 2010  . CAD (coronary artery disease) 2006   3v CAD >> s/p CABG in [place], patent LIMA to LAD and SVG to diagonal with 40% stenosis 2010 // Nuc 2/14: low risk, EF 56 // LHC 3/16: patent L-LAD, patent S-Dx // Echo 3/16: mild conc LVH, EF 60-65, Gr 1 DD, trivial TR, mild LAE // LHC 12/19: oLAD 100, pLCx 20, S-D1 patent, L-LAD patent, EF 45  . Cardiac pacemaker in situ 08/18/2018  . CHB (complete heart block) (HCC) 12/02/2014  . Chest pain with moderate risk of acute coronary syndrome   . Chronic systolic CHF (congestive heart failure) (HCC) 09/15/2018   EF 45 by LHC in 12/19 // Echo 09/2018: inf HK, EF 45-50, mod LAE  . Dyslipidemia    mixed   . ED (erectile dysfunction)   . Ejection fraction    EF 50%,  catheterization, September, 2010   //   EF 60-65%, echo, December 03, 2014   . Essential hypertension   . GERD (gastroesophageal reflux disease)   . Gout   . History of stomach ulcers 1980's  . Hx of CABG    2006, [place]   . Memory loss-SVD on MRI March 2015 11/18/2013  . Mixed hyperlipidemia    pt states "they took me off q med they had me on for high cholesterol; my good cholesterol is low" (09/13/2015)  . Myocardial infarction (HCC) 2006  . OSA (obstructive sleep apnea)    patient denies, stated had a sleep study and he was told he did not have    . PAF (paroxysmal atrial fibrillation) (HCC) 08/18/2018  . Palpitations    Event recorder November, 2010, no significant arrhythmias   . Pneumonia 2015  . Pneumonia 1990's X 4 in one year   "double pneumonia"  . Presence of permanent cardiac pacemaker   . Second degree AV block 09/13/2015  . Second degree AV block, Mobitz type I 03/10/2013   Patient had Mobitz 1 heart block that was asymptomatic. This resolved off beta blockade while in the hospital March, 2016. Plan to keep the patient off beta blockers. Patient will be seen in follow-up by Dr. Ladona Ridgel and the  [place] office.   . 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 Chronic systolic CHF (congestive heart failure) (HCC) 09/15/2018  . Cardiac pacemaker in situ 08/18/2018  . PAF (paroxysmal atrial fibrillation) (HCC) 08/18/2018  . Second degree AV block 09/13/2015  . Chest pain with moderate risk of acute coronary syndrome   . CHB (complete heart block) (HCC) 12/02/2014  . Memory loss-SVD on MRI March 2015 11/18/2013  . Obstructive sleep apnea   . Second degree AV block, Mobitz type I 03/10/2013  . 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(HCC)   . Dyslipidemia   . CAD (coronary artery disease)   . Essential hypertension   . Asthma   . Palpitations   . Bipolar 1 disorder (HCC)   . Hx of CABG   . Ejection fraction   . Bradycardia    . Tobacco abuse   . ALLERGIC RHINITIS 05/14/2010  . ASTHMA, UNSPECIFIED, UNSPECIFIED STATUS 07/05/2009    Past Surgical History:  Procedure Laterality Date  . BACK SURGERY    . CARDIAC CATHETERIZATION     "I've had quite a few; don't remember any stents" (09/13/2015)  . CORONARY ANGIOPLASTY    . CORONARY ARTERY BYPASS GRAFT  2006   "CABG X2, in [place]"  . EP IMPLANTABLE DEVICE N/A 09/13/2015   Procedure: Pacemaker Implant;  Surgeon: Hillis Range, MD;  Location: [facility] INVASIVE CV LAB;  Service: Cardiovascular;  Laterality: N/A;  . INSERT / REPLACE / REMOVE PACEMAKER  09/13/2015   [employer]  . LEFT HEART CATH AND CORS/GRAFTS ANGIOGRAPHY N/A 08/26/2018   Procedure: LEFT HEART CATH AND CORS/GRAFTS ANGIOGRAPHY;  Surgeon: Lennette Bihari, MD;  Location: [facility] INVASIVE CV LAB;  Service: Cardiovascular;  Laterality: N/A;  . LEFT HEART CATHETERIZATION WITH CORONARY/GRAFT ANGIOGRAM N/A 12/05/2014   Procedure: LEFT HEART CATHETERIZATION WITH Isabel Caprice;  Surgeon: Peter M Swaziland, MD;  Location: [facility] CATH LAB;  Service: Cardiovascular;  Laterality: N/A;  . LEG SURGERY Left 1961   "had 5 boils come up on my leg; they had to cut them out and put a drainage tube in there"  . LUMBAR DISC SURGERY  X 3   "ruptured disc"  . MAXIMUM ACCESS (MAS)POSTERIOR LUMBAR INTERBODY FUSION (PLIF) 1 LEVEL  ~ 2003   "L4-5; put rods & screws in"       Family History  Problem Relation Age of Onset  . COPD Mother   . Lung cancer Mother   . Stroke Mother   . Heart attack Mother   . Heart disease Father   . Heart attack Father   . Hyperlipidemia Brother   . Hypertension Brother   . Hypertension Brother   . Hypertension Sister     Social History   Tobacco Use  . Smoking status: Former Smoker    Packs/day: 2.00    Years: 17.00    Pack years: 34.00    Types: Cigarettes    Quit date: 03/13/2017    Years since quitting: 2.7  . Smokeless tobacco: Current User    Types: Snuff  . Tobacco comment:  09/13/2015 "quit chewing tobacco August 2016"  Substance Use Topics  . Alcohol use: Yes    Alcohol/week: 0.0 standard drinks    Comment: 09/13/2015 "got out of alcohol rehab 11/18/2005"  . Drug use: Yes    Comment: 09/13/2015 "got out of rehab for pills 11/18/2005"    Home Medications Prior to Admission medications   Medication Sig Start Date End Date  Taking? Authorizing Provider  allopurinol (ZYLOPRIM) 300 MG tablet Take 300 mg by mouth every evening.    Yes [provider]  COLCRYS 0.6 MG tablet Take 0.6 mg by mouth daily as needed (Gout).  06/12/15  Yes [provider]  ELIQUIS 5 MG TABS tablet TAKE 1 TABLET(5 MG) BY MOUTH TWICE DAILY Patient taking differently: Take 5 mg by mouth 2 (two) times daily.  07/12/19  Yes Marinus Maw, MD  loratadine (CLARITIN) 10 MG tablet Take 10 mg by mouth daily.   Yes [provider]  metoprolol succinate (TOPROL-XL) 25 MG 24 hr tablet TAKE 1 TABLET BY MOUTH EVERY DAY, MAY TAKE 1/2 TO 1 TABLET EXTRA FOR PALITATTIONS Patient taking differently: Take 25 mg by mouth daily. *May take one-half to one tablet extra for palpitations 11/24/19  Yes Marinus Maw, MD  omeprazole (PRILOSEC) 20 MG capsule Take 20 mg by mouth daily.     Yes [provider]  QUEtiapine (SEROQUEL) 300 MG tablet Take 300 mg by mouth at bedtime.     Yes [provider]  quinapril (ACCUPRIL) 40 MG tablet Take 1 tablet (40 mg total) by mouth daily. 06/26/18  Yes Marinus Maw, MD  rosuvastatin (CRESTOR) 10 MG tablet TAKE 1 TABLET BY MOUTH EVERY DAY. Please keep upcoming appt for further refills Patient taking differently: Take 10 mg by mouth daily.  12/07/19  Yes Marinus Maw, MD  tamsulosin (FLOMAX) 0.4 MG CAPS capsule Take 0.4 mg by mouth daily.  08/11/18  Yes [provider]  temazepam (RESTORIL) 30 MG capsule Take 30 mg by mouth at bedtime.  02/20/16  Yes [provider]  cyclobenzaprine (FLEXERIL) 10 MG tablet Take 1 tablet  (10 mg total) by mouth 3 (three) times daily as needed for muscle spasms. 12/21/19   Bethann Berkshire, MD  nitroGLYCERIN (NITROLINGUAL) 0.4 MG/SPRAY spray USE 1 SPRAY UNDER THE TONGUE EVERY 5 MINUTES AS NEEDED FOR CHEST PAIN Patient not taking: Reported on 12/21/2019 06/26/18   Marinus Maw, MD    Allergies    Sulfonamide derivatives  Review of Systems   Review of Systems  Constitutional: Negative for appetite change and fatigue.  HENT: Negative for congestion, ear discharge and sinus pressure.   Eyes: Negative for discharge.  Respiratory: Negative for cough.   Cardiovascular: Negative for chest pain.  Gastrointestinal: Negative for abdominal pain and diarrhea.  Genitourinary: Positive for flank pain. Negative for frequency and hematuria.  Musculoskeletal: Negative for back pain.  Skin: Negative for rash.  Neurological: Negative for seizures and headaches.  Psychiatric/Behavioral: Negative for hallucinations.    Physical Exam Updated Vital Signs BP 110/78   Pulse 71   Temp 98 F (36.7 C) (Oral)   Resp 16   Ht 5\' 8"  (1.727 m)   Wt 106.6 kg   SpO2 99%   BMI 35.73 kg/m   Physical Exam Vitals and nursing note reviewed.  Constitutional:      Appearance: He is well-developed.  HENT:     Head: Normocephalic.     Nose: Nose normal.  Eyes:     General: No scleral icterus.    Conjunctiva/sclera: Conjunctivae normal.  Neck:     Thyroid: No thyromegaly.  Cardiovascular:     Rate and Rhythm: Normal rate and regular rhythm.     Heart sounds: No murmur. No friction rub. No gallop.   Pulmonary:     Breath sounds: No stridor. No wheezing or rales.  Chest:  Chest wall: No tenderness.  Abdominal:     General: There is no distension.     Tenderness: There is no abdominal tenderness. There is no rebound.  Genitourinary:    Comments: Tender right flank Musculoskeletal:        General: Normal range of motion.     Cervical back: Neck supple.  Lymphadenopathy:     Cervical: No  cervical adenopathy.  Skin:    Findings: No erythema or rash.  Neurological:     Mental Status: He is alert and oriented to person, place, and time.     Motor: No abnormal muscle tone.     Coordination: Coordination normal.  Psychiatric:        Behavior: Behavior normal.     ED Results / Procedures / Treatments   Labs (all labs ordered are listed, but only abnormal results are displayed) Labs Reviewed  CBC WITH DIFFERENTIAL/PLATELET - Abnormal; Notable for the following components:      Result Value   Hemoglobin 17.1 (*)    All other components within normal limits  COMPREHENSIVE METABOLIC PANEL - Abnormal; Notable for the following components:   Glucose, Bld 100 (*)    All other components within normal limits  URINALYSIS, ROUTINE W REFLEX MICROSCOPIC - Abnormal; Notable for the following components:   Leukocytes,Ua TRACE (*)    All other components within normal limits  D-DIMER, QUANTITATIVE (NOT AT [facility])    EKG None  Radiology CT Renal Stone Study  Result Date: 12/21/2019 CLINICAL DATA:  Flank pain. EXAM: CT ABDOMEN AND PELVIS WITHOUT CONTRAST TECHNIQUE: Multidetector CT imaging of the abdomen and pelvis was performed following the standard protocol without IV contrast. COMPARISON:  None. FINDINGS: Lower chest: 4 mm nodule is noted posteriorly in the right lower lobe. Hepatobiliary: Mild cholelithiasis is noted. No biliary dilatation is noted. Hepatic steatosis is noted. Pancreas: Unremarkable. No pancreatic ductal dilatation or surrounding inflammatory changes. Spleen: Moderate splenomegaly is noted. Adrenals/Urinary Tract: Adrenal glands are unremarkable. Kidneys are normal, without renal calculi, focal lesion, or hydronephrosis. Bladder is unremarkable. Stomach/Bowel: Stomach is within normal limits. Appendix appears normal. No evidence of bowel wall thickening, distention, or inflammatory changes. Vascular/Lymphatic: No significant vascular findings are present. No enlarged  abdominal or pelvic lymph nodes. Reproductive: Mild prostatic enlargement is noted. Other: No abdominal wall hernia or abnormality. No abdominopelvic ascites. Musculoskeletal: No acute or significant osseous findings. IMPRESSION: 1. Mild cholelithiasis. 2. Hepatic steatosis. 3. Moderate splenomegaly. 4. No hydronephrosis or renal obstruction is noted. 5. No renal or ureteral calculi are noted. 6. Mild prostatic enlargement. 7. 4 mm nodule seen posteriorly in right lower lobe. No follow-up needed if patient is low-risk. Non-contrast chest CT can be considered in 12 months if patient is high-risk. This recommendation follows the consensus statement: Guidelines for Management of Incidental Pulmonary Nodules Detected on CT Images: From the Fleischner Society 2017; Radiology 2017; 284:228-243. Electronically Signed   By: Lupita Raider M.D.   On: 12/21/2019 18:52    Procedures Procedures (including critical care time)  Medications Ordered in ED Medications  HYDROmorphone (DILAUDID) injection 1 mg (1 mg Intravenous Given 12/21/19 1734)  ondansetron (ZOFRAN) injection 4 mg (4 mg Intravenous Given 12/21/19 1734)  sodium chloride 0.9 % bolus 1,000 mL (0 mLs Intravenous Stopped 12/21/19 2141)  ketorolac (TORADOL) 30 MG/ML injection 30 mg (30 mg Intravenous Given 12/21/19 2033)    ED Course  I have reviewed the triage vital signs and the nursing notes.  Pertinent labs & imaging results  that were available during my care of the patient were reviewed by me and considered in my medical decision making (see chart for details).    MDM Rules/Calculators/A&P                       Labs are unremarkable.  With musculoskeletal pain and will be given Flexeril      This patient presents to the ED for concern of right flank pain, this involves an extensive number of treatment options, and is a complaint that carries with it a high risk of complications and morbidity.  The differential diagnosis includes kidney stone  abdominal aneurysm   Lab Tests:   I Ordered, reviewed, and interpreted labs, which included CBC chemistries D-dimer urinalysis  Medicines ordered:   I ordered medication Dilaudid Toradol for pain  Imaging Studies ordered:   I ordered imaging studies which included renal CT and  I independently visualized and interpreted imaging which showed no kidney stone mild enlarged prostate  Additional history obtained:   Additional history obtained from wife  Previous records obtained and reviewed   Consultations Obtained:   Reevaluation:  After the interventions stated above, I reevaluated the patient and found patient improved with treatment  Critical Interventions:  .   Final Clinical Impression(s) / ED Diagnoses Final diagnoses:  Flank pain    Rx / DC Orders ED Discharge Orders         Ordered    cyclobenzaprine (FLEXERIL) 10 MG tablet  3 times daily PRN     12/21/19 2321           Milton Ferguson, MD 12/22/19 1216

## 2019-12-27 ENCOUNTER — Other Ambulatory Visit: Payer: Self-pay | Admitting: Internal Medicine

## 2019-12-29 ENCOUNTER — Other Ambulatory Visit: Payer: Self-pay | Admitting: Neurosurgery

## 2019-12-29 ENCOUNTER — Other Ambulatory Visit (HOSPITAL_COMMUNITY): Payer: Self-pay | Admitting: Neurosurgery

## 2019-12-29 DIAGNOSIS — Z981 Arthrodesis status: Secondary | ICD-10-CM

## 2019-12-30 ENCOUNTER — Other Ambulatory Visit (HOSPITAL_COMMUNITY): Payer: Self-pay | Admitting: Neurosurgery

## 2019-12-30 DIAGNOSIS — Z981 Arthrodesis status: Secondary | ICD-10-CM

## 2020-01-08 ENCOUNTER — Other Ambulatory Visit: Payer: Self-pay | Admitting: Internal Medicine

## 2020-01-10 NOTE — Telephone Encounter (Signed)
Last OV 12/09/19 64 years old Scr 0.88 on 12/21/19 106.6kg Eliquis 5mg  BID sent to pharmacy

## 2020-01-20 ENCOUNTER — Ambulatory Visit (HOSPITAL_COMMUNITY)
Admission: RE | Admit: 2020-01-20 | Discharge: 2020-01-20 | Disposition: A | Discharge status: Home / Self Care | Payer: No Typology Code available for payment source | Source: Ambulatory Visit | Attending: Neurosurgery | Admitting: Neurosurgery

## 2020-01-20 ENCOUNTER — Other Ambulatory Visit: Payer: Self-pay

## 2020-01-20 DIAGNOSIS — Z981 Arthrodesis status: Secondary | ICD-10-CM | POA: Diagnosis present

## 2020-01-20 MED ORDER — GADOBUTROL 1 MMOL/ML IV SOLN
10.0000 mL | Freq: Once | INTRAVENOUS | Status: AC | PRN
  Administered 2020-01-20: 14:00:00 10 mL via INTRAVENOUS

## 2020-02-11 ENCOUNTER — Other Ambulatory Visit: Payer: Self-pay

## 2020-02-11 MED ORDER — NITROGLYCERIN 0.4 MG/SPRAY TL SOLN: 3 refills | Status: DC

## 2020-02-11 NOTE — Telephone Encounter (Signed)
Pt's medication was sent to pt's pharmacy as requested. Confirmation received.  °

## 2020-03-08 ENCOUNTER — Other Ambulatory Visit: Payer: Self-pay | Admitting: Internal Medicine

## 2020-03-16 ENCOUNTER — Ambulatory Visit (INDEPENDENT_AMBULATORY_CARE_PROVIDER_SITE_OTHER): Payer: BC Managed Care – PPO | Admitting: *Deleted

## 2020-03-16 DIAGNOSIS — I441 Atrioventricular block, second degree: Secondary | ICD-10-CM | POA: Diagnosis not present

## 2020-03-17 LAB — CUP PACEART REMOTE DEVICE CHECK
Battery Remaining Longevity: 57 mo
Battery Voltage: 2.99 V
Brady Statistic AP VP Percent: 7.02 %
Brady Statistic AP VS Percent: 0.02 %
Brady Statistic AS VP Percent: 91.44 %
Brady Statistic AS VS Percent: 1.53 %
Brady Statistic RA Percent Paced: 6.91 %
Brady Statistic RV Percent Paced: 98.33 %
Date Time Interrogation Session: 20210702123137
Implantable Lead Implant Date: 20161228
Implantable Lead Implant Date: 20161228
Implantable Lead Location: 753859
Implantable Lead Location: 753860
Implantable Lead Model: 5076
Implantable Lead Model: 5076
Implantable Pulse Generator Implant Date: 20161228
Lead Channel Impedance Value: 342 Ohm
Lead Channel Impedance Value: 399 Ohm
Lead Channel Impedance Value: 456 Ohm
Lead Channel Impedance Value: 456 Ohm
Lead Channel Pacing Threshold Amplitude: 0.75 V
Lead Channel Pacing Threshold Amplitude: 1.375 V
Lead Channel Pacing Threshold Pulse Width: 0.4 ms
Lead Channel Pacing Threshold Pulse Width: 0.4 ms
Lead Channel Sensing Intrinsic Amplitude: 1.75 mV
Lead Channel Sensing Intrinsic Amplitude: 1.75 mV
Lead Channel Sensing Intrinsic Amplitude: 16.5 mV
Lead Channel Sensing Intrinsic Amplitude: 16.5 mV
Lead Channel Setting Pacing Amplitude: 2.5 V
Lead Channel Setting Pacing Amplitude: 2.75 V
Lead Channel Setting Pacing Pulse Width: 0.4 ms
Lead Channel Setting Sensing Sensitivity: 2.8 mV

## 2020-03-21 NOTE — Progress Notes (Signed)
Remote pacemaker transmission.   

## 2020-06-03 ENCOUNTER — Other Ambulatory Visit: Payer: Self-pay | Admitting: Internal Medicine

## 2020-06-04 ENCOUNTER — Other Ambulatory Visit: Payer: Self-pay | Admitting: Internal Medicine

## 2020-06-05 NOTE — Telephone Encounter (Signed)
Prescription refill request for Eliquis received.  Last office visit: 12/09/2019, [name] [name] Scr: 0.88, 12/21/2019 Age:  63 y.o. Weight: 106.6 kg   Prescription refill sent.

## 2020-06-15 ENCOUNTER — Ambulatory Visit (INDEPENDENT_AMBULATORY_CARE_PROVIDER_SITE_OTHER): Payer: BC Managed Care – PPO

## 2020-06-15 DIAGNOSIS — I442 Atrioventricular block, complete: Secondary | ICD-10-CM

## 2020-06-16 LAB — CUP PACEART REMOTE DEVICE CHECK
Battery Remaining Longevity: 50 mo
Battery Voltage: 2.99 V
Brady Statistic AP VP Percent: 9.81 %
Brady Statistic AP VS Percent: 0.02 %
Brady Statistic AS VP Percent: 89.46 %
Brady Statistic AS VS Percent: 0.71 %
Brady Statistic RA Percent Paced: 9.69 %
Brady Statistic RV Percent Paced: 99.18 %
Date Time Interrogation Session: 20210930154217
Implantable Lead Implant Date: 20161228
Implantable Lead Implant Date: 20161228
Implantable Lead Location: 753859
Implantable Lead Location: 753860
Implantable Lead Model: 5076
Implantable Lead Model: 5076
Implantable Pulse Generator Implant Date: 20161228
Lead Channel Impedance Value: 342 Ohm
Lead Channel Impedance Value: 418 Ohm
Lead Channel Impedance Value: 456 Ohm
Lead Channel Impedance Value: 475 Ohm
Lead Channel Pacing Threshold Amplitude: 0.75 V
Lead Channel Pacing Threshold Amplitude: 1.25 V
Lead Channel Pacing Threshold Pulse Width: 0.4 ms
Lead Channel Pacing Threshold Pulse Width: 0.4 ms
Lead Channel Sensing Intrinsic Amplitude: 16.25 mV
Lead Channel Sensing Intrinsic Amplitude: 16.25 mV
Lead Channel Sensing Intrinsic Amplitude: 2.5 mV
Lead Channel Sensing Intrinsic Amplitude: 2.5 mV
Lead Channel Setting Pacing Amplitude: 2.5 V
Lead Channel Setting Pacing Amplitude: 2.75 V
Lead Channel Setting Pacing Pulse Width: 0.4 ms
Lead Channel Setting Sensing Sensitivity: 2.8 mV

## 2020-06-19 NOTE — Progress Notes (Signed)
Remote pacemaker transmission.   

## 2020-07-13 ENCOUNTER — Other Ambulatory Visit: Payer: Self-pay | Admitting: Internal Medicine

## 2020-07-13 MED ORDER — APIXABAN 5 MG PO TABS: ORAL_TABLET | ORAL | 5 refills | Status: DC

## 2020-07-13 NOTE — Telephone Encounter (Signed)
Eliquis 5mg  refill request received. Patient is 64 years old, weight-106.6kg, Crea-0.88 on 12/17/2019, Diagnosis-Afib, and last seen by 02/16/2020 on 12/09/2019. Dose is appropriate based on dosing criteria. Will send in refill to requested pharmacy.

## 2020-07-13 NOTE — Telephone Encounter (Signed)
   *  STAT* If patient is at the pharmacy, call can be transferred to refill team.   1. Which medications need to be refilled? (please list name of each medication and dose if known)   ELIQUIS 5 MG TABS tablet     2. Which pharmacy/location (including street and city if local pharmacy) is medication to be sent to? [facility] [id number] - [place], [place] - [address] AT [facility] [address]  3. Do they need a 30 day or 90 day supply? 30 days

## 2020-07-19 ENCOUNTER — Emergency Department (HOSPITAL_COMMUNITY)
Admission: EM | Admit: 2020-07-19 | Discharge: 2020-07-19 | Disposition: A | Discharge status: Home / Self Care | Payer: BC Managed Care – PPO | Attending: Emergency Medicine | Admitting: Emergency Medicine

## 2020-07-19 ENCOUNTER — Encounter (HOSPITAL_COMMUNITY): Payer: Self-pay | Admitting: *Deleted

## 2020-07-19 ENCOUNTER — Other Ambulatory Visit: Payer: Self-pay

## 2020-07-19 ENCOUNTER — Emergency Department (HOSPITAL_COMMUNITY): Payer: BC Managed Care – PPO

## 2020-07-19 DIAGNOSIS — I110 Hypertensive heart disease with heart failure: Secondary | ICD-10-CM | POA: Diagnosis not present

## 2020-07-19 DIAGNOSIS — J45909 Unspecified asthma, uncomplicated: Secondary | ICD-10-CM | POA: Diagnosis not present

## 2020-07-19 DIAGNOSIS — I5022 Chronic systolic (congestive) heart failure: Secondary | ICD-10-CM | POA: Insufficient documentation

## 2020-07-19 DIAGNOSIS — R0789 Other chest pain: Secondary | ICD-10-CM | POA: Diagnosis present

## 2020-07-19 DIAGNOSIS — Z950 Presence of cardiac pacemaker: Secondary | ICD-10-CM | POA: Diagnosis not present

## 2020-07-19 DIAGNOSIS — Z7901 Long term (current) use of anticoagulants: Secondary | ICD-10-CM | POA: Diagnosis not present

## 2020-07-19 DIAGNOSIS — Z955 Presence of coronary angioplasty implant and graft: Secondary | ICD-10-CM | POA: Diagnosis not present

## 2020-07-19 DIAGNOSIS — I2510 Atherosclerotic heart disease of native coronary artery without angina pectoris: Secondary | ICD-10-CM | POA: Insufficient documentation

## 2020-07-19 DIAGNOSIS — Z79899 Other long term (current) drug therapy: Secondary | ICD-10-CM | POA: Diagnosis not present

## 2020-07-19 DIAGNOSIS — Z87891 Personal history of nicotine dependence: Secondary | ICD-10-CM | POA: Diagnosis not present

## 2020-07-19 DIAGNOSIS — R059 Cough, unspecified: Secondary | ICD-10-CM

## 2020-07-19 MED ORDER — GUAIFENESIN-CODEINE 100-10 MG/5ML PO SOLN: 10.0000 mL | Freq: Four times a day (QID) | ORAL | 0 refills | Status: DC | PRN

## 2020-07-19 MED ORDER — AZITHROMYCIN 250 MG PO TABS: 250.0000 mg | ORAL_TABLET | Freq: Every day | ORAL | 0 refills | Status: DC

## 2020-07-19 MED ORDER — AZITHROMYCIN 250 MG PO TABS
500.0000 mg | ORAL_TABLET | Freq: Once | ORAL | Status: AC
  Administered 2020-07-19: 500 mg via ORAL
  Filled 2020-07-19: qty 2

## 2020-07-19 NOTE — ED Provider Notes (Signed)
[facility] EMERGENCY DEPARTMENT Provider Note   CSN: [id number] Arrival date & time: 07/19/20  0047     History Chief Complaint  Patient presents with  . Chest Pain    [name] [name] is a 64 y.o. male.  Patient is a 64 year old male with extensive past medical history including coronary artery disease with CABG, CHF, complete heart block, bipolar, atrial fibrillation.  He presents today for evaluation of chest wall pain.  Patient reports a cough that has been persistent for the past 2 weeks.  He is occasionally coughing up phlegm.  He coughed so hard yesterday, then he developed pain to the right side of his chest.  There is an area to the right lateral ribs that is tender to the touch, it hurts when he breathes, and hurts worse when he changes position.  He denies any shortness of breath, nausea, diaphoresis, or radiation to the arm or jaw.  The history is provided by the patient.  Chest Pain Pain location:  R lateral chest Pain quality: sharp   Pain radiates to:  Does not radiate Pain severity:  Moderate Onset quality:  Sudden Duration:  1 day Timing:  Constant Progression:  Unchanged Chronicity:  New Context: movement   Relieved by:  Rest Worsened by:  Coughing, certain positions, deep breathing and movement Ineffective treatments:  None tried      Past Medical History:  Diagnosis Date  . ALLERGIC RHINITIS 05/14/2010   Qualifier: Diagnosis of  By: Garnette Czech PA, Dawn    . Asthma   . Asthma   . Atrial fibrillation [facility])    new November 2016  . Bipolar 1 disorder (HCC)    "put me on this after I got out of drug rehab 11/18/2005 and couldn't sleep; added anxiety RX and have been sleeping fine since"  . Bradycardia    Very limited Wenkebach on event recorder November 2010  . CAD (coronary artery disease) 2006   3v CAD >> s/p CABG in [place], patent LIMA to LAD and SVG to diagonal with 40% stenosis 2010 // Nuc 2/14: low risk, EF 56 // LHC 3/16: patent L-LAD, patent  S-Dx // Echo 3/16: mild conc LVH, EF 60-65, Gr 1 DD, trivial TR, mild LAE // LHC 12/19: oLAD 100, pLCx 20, S-D1 patent, L-LAD patent, EF 45  . Cardiac pacemaker in situ 08/18/2018  . CHB (complete heart block) (HCC) 12/02/2014  . Chest pain with moderate risk of acute coronary syndrome   . Chronic systolic CHF (congestive heart failure) (HCC) 09/15/2018   EF 45 by LHC in 12/19 // Echo 09/2018: inf HK, EF 45-50, mod LAE  . Dyslipidemia    mixed   . ED (erectile dysfunction)   . Ejection fraction    EF 50%, catheterization, September, 2010   //   EF 60-65%, echo, December 03, 2014   . Essential hypertension   . GERD (gastroesophageal reflux disease)   . Gout   . History of stomach ulcers 1980's  . Hx of CABG    2006, [place]   . Memory loss-SVD on MRI March 2015 11/18/2013  . Mixed hyperlipidemia    pt states "they took me off q med they had me on for high cholesterol; my good cholesterol is low" (09/13/2015)  . Myocardial infarction (HCC) 2006  . OSA (obstructive sleep apnea)    patient denies, stated had a sleep study and he was told he did not have    . PAF (paroxysmal atrial fibrillation) (HCC) 08/18/2018  .  Palpitations    Event recorder November, 2010, no significant arrhythmias   . Pneumonia 2015  . Pneumonia 1990's X 4 in one year   "double pneumonia"  . Presence of permanent cardiac pacemaker   . Second degree AV block 09/13/2015  . Second degree AV block, Mobitz type I 03/10/2013   Patient had Mobitz 1 heart block that was asymptomatic. This resolved off beta blockade while in the hospital March, 2016. Plan to keep the patient off beta blockers. Patient will be seen in follow-up by Dr. Ladona Ridgel and the [place] office.   . 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 Chronic systolic CHF (congestive heart failure) (HCC) 09/15/2018  . Cardiac pacemaker in situ 08/18/2018  . PAF (paroxysmal atrial fibrillation) (HCC) 08/18/2018    . Second degree AV block 09/13/2015  . Chest pain with moderate risk of acute coronary syndrome   . CHB (complete heart block) (HCC) 12/02/2014  . Memory loss-SVD on MRI March 2015 11/18/2013  . Obstructive sleep apnea   . Second degree AV block, Mobitz type I 03/10/2013  . 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(HCC)   . Dyslipidemia   . CAD (coronary artery disease)   . Essential hypertension   . Asthma   . Palpitations   . Bipolar 1 disorder (HCC)   . Hx of CABG   . Ejection fraction   . Bradycardia   . Tobacco abuse   . ALLERGIC RHINITIS 05/14/2010  . ASTHMA, UNSPECIFIED, UNSPECIFIED STATUS 07/05/2009    Past Surgical History:  Procedure Laterality Date  . BACK SURGERY    . CARDIAC CATHETERIZATION     "I've had quite a few; don't remember any stents" (09/13/2015)  . CORONARY ANGIOPLASTY    . CORONARY ARTERY BYPASS GRAFT  2006   "CABG X2, in [place]"  . EP IMPLANTABLE DEVICE N/A 09/13/2015   Procedure: Pacemaker Implant;  Surgeon: Hillis Range, MD;  Location: [facility] INVASIVE CV LAB;  Service: Cardiovascular;  Laterality: N/A;  . INSERT / REPLACE / REMOVE PACEMAKER  09/13/2015   Medtronic  . LEFT HEART CATH AND CORS/GRAFTS ANGIOGRAPHY N/A 08/26/2018   Procedure: LEFT HEART CATH AND CORS/GRAFTS ANGIOGRAPHY;  Surgeon: Lennette Bihari, MD;  Location: [facility] INVASIVE CV LAB;  Service: Cardiovascular;  Laterality: N/A;  . LEFT HEART CATHETERIZATION WITH CORONARY/GRAFT ANGIOGRAM N/A 12/05/2014   Procedure: LEFT HEART CATHETERIZATION WITH Isabel Caprice;  Surgeon: Peter M Swaziland, MD;  Location: [facility] CATH LAB;  Service: Cardiovascular;  Laterality: N/A;  . LEG SURGERY Left 1961   "had 5 boils come up on my leg; they had to cut them out and put a drainage tube in there"  . LUMBAR DISC SURGERY  X 3   "ruptured disc"  . MAXIMUM ACCESS (MAS)POSTERIOR LUMBAR INTERBODY FUSION (PLIF) 1 LEVEL  ~ 2003   "L4-5; put rods & screws in"       Family History  Problem Relation Age of Onset  .  COPD Mother   . Lung cancer Mother   . Stroke Mother   . Heart attack Mother   . Heart disease Father   . Heart attack Father   . Hyperlipidemia Brother   . Hypertension Brother   . Hypertension Brother   . Hypertension Sister     Social History   Tobacco Use  . Smoking status: Former Smoker    Packs/day: 2.00    Years: 17.00    Pack years: 34.00    Types:  Cigarettes    Quit date: 03/13/2017    Years since quitting: 3.3  . Smokeless tobacco: Current User    Types: Snuff  . Tobacco comment: 09/13/2015 "quit chewing tobacco August 2016"  Substance Use Topics  . Alcohol use: Yes    Alcohol/week: 0.0 standard drinks    Comment: 09/13/2015 "got out of alcohol rehab 11/18/2005"  . Drug use: Yes    Comment: 09/13/2015 "got out of rehab for pills 11/18/2005"    Home Medications Prior to Admission medications   Medication Sig Start Date End Date Taking? Authorizing Provider  allopurinol (ZYLOPRIM) 300 MG tablet Take 300 mg by mouth every evening.     [provider]  apixaban (ELIQUIS) 5 MG TABS tablet TAKE 1 TABLET(5 MG) BY MOUTH TWICE DAILY 07/13/20   Marinus Maw, MD  COLCRYS 0.6 MG tablet Take 0.6 mg by mouth daily as needed (Gout).  06/12/15   [provider]  cyclobenzaprine (FLEXERIL) 10 MG tablet Take 1 tablet (10 mg total) by mouth 3 (three) times daily as needed for muscle spasms. 12/21/19   Bethann Berkshire, MD  loratadine (CLARITIN) 10 MG tablet Take 10 mg by mouth daily.    [provider]  metoprolol succinate (TOPROL-XL) 25 MG 24 hr tablet TAKE 1 TABLET BY MOUTH EVERY DAY. MAY TAKE 1/2 TO 1 TABLET EXTRA FOR PALITATTIONS 12/28/19   Marinus Maw, MD  nitroGLYCERIN (NITROLINGUAL) 0.4 MG/SPRAY spray USE 1 SPRAY UNDER THE TONGUE EVERY 5 MINUTES AS NEEDED FOR CHEST PAIN 02/11/20   Marinus Maw, MD  omeprazole (PRILOSEC) 20 MG capsule Take 20 mg by mouth daily.      [provider]  QUEtiapine (SEROQUEL) 300 MG tablet Take 300 mg by mouth at  bedtime.      [provider]  quinapril (ACCUPRIL) 40 MG tablet Take 1 tablet (40 mg total) by mouth daily. 06/26/18   Marinus Maw, MD  rosuvastatin (CRESTOR) 10 MG tablet TAKE 1 TABLET BY MOUTH EVERY DAY 06/05/20   Marinus Maw, MD  tamsulosin (FLOMAX) 0.4 MG CAPS capsule Take 0.4 mg by mouth daily.  08/11/18   [provider]  temazepam (RESTORIL) 30 MG capsule Take 30 mg by mouth at bedtime.  02/20/16   [provider]    Allergies    Sulfonamide derivatives  Review of Systems   Review of Systems  Cardiovascular: Positive for chest pain.  All other systems reviewed and are negative.   Physical Exam Updated Vital Signs BP (!) 159/94 (BP Location: Left Arm)   Pulse 74   Temp 98 F (36.7 C) (Oral)   Resp 20   Ht 5\' 8"  (1.727 m)   Wt 111.6 kg   SpO2 93%   BMI 37.40 kg/m   Physical Exam Vitals and nursing note reviewed.  Constitutional:      General: He is not in acute distress.    Appearance: He is well-developed. He is not diaphoretic.  HENT:     Head: Normocephalic and atraumatic.  Cardiovascular:     Rate and Rhythm: Normal rate and regular rhythm.     Heart sounds: No murmur heard.  No friction rub.  Pulmonary:     Effort: Pulmonary effort is normal. No respiratory distress.     Breath sounds: Normal breath sounds. No wheezing or rales.     Comments: There is exquisite tenderness to palpation to the right lateral ribs.  There is no crepitus or palpable abnormality.  Breath sounds  are clear and equal.  This reproduces his symptoms. Abdominal:     General: Bowel sounds are normal. There is no distension.     Palpations: Abdomen is soft.     Tenderness: There is no abdominal tenderness.  Musculoskeletal:        General: Normal range of motion.     Cervical back: Normal range of motion and neck supple.  Skin:    General: Skin is warm and dry.  Neurological:     Mental Status: He is alert and oriented to person, place, and time.      Coordination: Coordination normal.     ED Results / Procedures / Treatments   Labs (all labs ordered are listed, but only abnormal results are displayed) Labs Reviewed - No data to display  EKG None  Radiology No results found.  Procedures Procedures (including critical care time)  Medications Ordered in ED Medications - No data to display  ED Course  I have reviewed the triage vital signs and the nursing notes.  Pertinent labs & imaging results that were available during my care of the patient were reviewed by me and considered in my medical decision making (see chart for details).    MDM Rules/Calculators/A&P  Patient presenting here with complaints of pain to the right lateral ribs that is clearly musculoskeletal in nature.  It is easily reproducible with palpation and worse when he changes position and breathes.  He reports a 2-week history of cough that is unrelieved with Tessalon.  Patient's x-rays show no evidence for fracture or pneumonia or pneumothorax.  His vitals are stable without hypoxia or tachycardia.  I highly doubt pulmonary embolism as the patient is already on Eliquis.  At this point, I feel as though discharge is appropriate.  He will be prescribed antibiotics for his persistent cough as well as Robitussin with codeine he can take as needed for cough.  Final Clinical Impression(s) / ED Diagnoses Final diagnoses:  None    Rx / DC Orders ED Discharge Orders    None       Geoffery Lyons, MD 07/19/20 0400

## 2020-07-19 NOTE — ED Triage Notes (Signed)
Pt c/o right rib pain that has got progressively worse throughout the day; pt c/o dry cough

## 2020-07-19 NOTE — Discharge Instructions (Addendum)
Begin taking Zithromax as prescribed.  Take Robitussin with codeine as needed for cough.  Follow-up with primary doctor if symptoms or not improving in the next few days, and return to the ER if symptoms significantly worsen or change.

## 2020-09-14 ENCOUNTER — Ambulatory Visit (INDEPENDENT_AMBULATORY_CARE_PROVIDER_SITE_OTHER): Payer: BC Managed Care – PPO

## 2020-09-14 DIAGNOSIS — I441 Atrioventricular block, second degree: Secondary | ICD-10-CM

## 2020-09-15 LAB — CUP PACEART REMOTE DEVICE CHECK
Battery Remaining Longevity: 52 mo
Battery Voltage: 2.99 V
Brady Statistic AP VP Percent: 5.98 %
Brady Statistic AP VS Percent: 0.03 %
Brady Statistic AS VP Percent: 93.29 %
Brady Statistic AS VS Percent: 0.71 %
Brady Statistic RA Percent Paced: 5.98 %
Brady Statistic RV Percent Paced: 99.15 %
Date Time Interrogation Session: 20211230174512
Implantable Lead Implant Date: 20161228
Implantable Lead Implant Date: 20161228
Implantable Lead Location: 753859
Implantable Lead Location: 753860
Implantable Lead Model: 5076
Implantable Lead Model: 5076
Implantable Pulse Generator Implant Date: 20161228
Lead Channel Impedance Value: 361 Ohm
Lead Channel Impedance Value: 399 Ohm
Lead Channel Impedance Value: 456 Ohm
Lead Channel Impedance Value: 475 Ohm
Lead Channel Pacing Threshold Amplitude: 0.75 V
Lead Channel Pacing Threshold Amplitude: 1.5 V
Lead Channel Pacing Threshold Pulse Width: 0.4 ms
Lead Channel Pacing Threshold Pulse Width: 0.4 ms
Lead Channel Sensing Intrinsic Amplitude: 15.875 mV
Lead Channel Sensing Intrinsic Amplitude: 15.875 mV
Lead Channel Sensing Intrinsic Amplitude: 2.375 mV
Lead Channel Sensing Intrinsic Amplitude: 2.375 mV
Lead Channel Setting Pacing Amplitude: 2.5 V
Lead Channel Setting Pacing Amplitude: 3 V
Lead Channel Setting Pacing Pulse Width: 0.4 ms
Lead Channel Setting Sensing Sensitivity: 2.8 mV

## 2020-09-28 NOTE — Progress Notes (Signed)
Remote pacemaker transmission.   

## 2020-10-10 ENCOUNTER — Telehealth: Payer: Self-pay | Admitting: Internal Medicine

## 2020-10-10 MED ORDER — APIXABAN 5 MG PO TABS: ORAL_TABLET | ORAL | 0 refills | Status: DC

## 2020-10-10 NOTE — Telephone Encounter (Signed)
°*  STAT* If patient is at the pharmacy, call can be transferred to refill team.   1. Which medications need to be refilled? (please list name of each medication and dose if known)  apixaban (ELIQUIS) 5 MG TABS tablet   2. Which pharmacy/location (including street and city if local pharmacy) is medication to be sent to? [facility] [address] [place], [place] [address]  3. Do they need a 30 day or 90 day supply? 90

## 2020-10-10 NOTE — Telephone Encounter (Signed)
Prescription refill request for Eliquis received. Indication:12/09/19 Last office visit: Scr: 0.88 on 12/21/19 Age: 65 Weight:111kg

## 2020-11-13 ENCOUNTER — Telehealth: Payer: Self-pay | Admitting: Pharmacist

## 2020-11-13 NOTE — Telephone Encounter (Signed)
Level 1 Eliquis appeals has been faxed.

## 2020-11-14 ENCOUNTER — Telehealth: Payer: Self-pay | Admitting: Internal Medicine

## 2020-11-14 NOTE — Telephone Encounter (Signed)
Peer to peer competed

## 2020-11-14 NOTE — Telephone Encounter (Signed)
w Message:   This doctor says he needs to do a peer to peer review with Dr Ladona Ridgel or Dr Lubertha Basque nurse for pt's Eliquis please.

## 2020-11-14 NOTE — Telephone Encounter (Signed)
Called MD back and left VM

## 2020-11-23 MED ORDER — RIVAROXABAN 20 MG PO TABS: 20.0000 mg | ORAL_TABLET | Freq: Every day | ORAL | Status: DC

## 2020-11-23 NOTE — Addendum Note (Signed)
Addended by: Malena Peer D on: 11/23/2020 11:51 AM   Modules accepted: Orders

## 2020-11-23 NOTE — Telephone Encounter (Signed)
Eliquis second level appeals denied.No option for External appeals. Called pt to advise that he could still use copay card. However PCP already changed him to Xarelto. He does not have a savings card. I activated a savings card for Xarelto and faxed to pharmacy. Updated med list.

## 2020-12-02 ENCOUNTER — Other Ambulatory Visit: Payer: Self-pay | Admitting: Internal Medicine

## 2020-12-14 ENCOUNTER — Ambulatory Visit (INDEPENDENT_AMBULATORY_CARE_PROVIDER_SITE_OTHER): Payer: BC Managed Care – PPO

## 2020-12-14 DIAGNOSIS — I441 Atrioventricular block, second degree: Secondary | ICD-10-CM

## 2020-12-18 LAB — CUP PACEART REMOTE DEVICE CHECK
Battery Remaining Longevity: 46 mo
Battery Voltage: 2.98 V
Brady Statistic AP VP Percent: 11.35 %
Brady Statistic AP VS Percent: 0.03 %
Brady Statistic AS VP Percent: 87.95 %
Brady Statistic AS VS Percent: 0.66 %
Brady Statistic RA Percent Paced: 11.35 %
Brady Statistic RV Percent Paced: 99.2 %
Date Time Interrogation Session: 20220401132339
Implantable Lead Implant Date: 20161228
Implantable Lead Implant Date: 20161228
Implantable Lead Location: 753859
Implantable Lead Location: 753860
Implantable Lead Model: 5076
Implantable Lead Model: 5076
Implantable Pulse Generator Implant Date: 20161228
Lead Channel Impedance Value: 342 Ohm
Lead Channel Impedance Value: 456 Ohm
Lead Channel Impedance Value: 456 Ohm
Lead Channel Impedance Value: 513 Ohm
Lead Channel Pacing Threshold Amplitude: 0.75 V
Lead Channel Pacing Threshold Amplitude: 1.375 V
Lead Channel Pacing Threshold Pulse Width: 0.4 ms
Lead Channel Pacing Threshold Pulse Width: 0.4 ms
Lead Channel Sensing Intrinsic Amplitude: 19.125 mV
Lead Channel Sensing Intrinsic Amplitude: 19.125 mV
Lead Channel Sensing Intrinsic Amplitude: 2.25 mV
Lead Channel Sensing Intrinsic Amplitude: 2.25 mV
Lead Channel Setting Pacing Amplitude: 2.5 V
Lead Channel Setting Pacing Amplitude: 2.75 V
Lead Channel Setting Pacing Pulse Width: 0.4 ms
Lead Channel Setting Sensing Sensitivity: 2.8 mV

## 2020-12-26 ENCOUNTER — Other Ambulatory Visit: Payer: Self-pay | Admitting: Family Medicine

## 2020-12-26 DIAGNOSIS — R911 Solitary pulmonary nodule: Secondary | ICD-10-CM

## 2020-12-26 NOTE — Progress Notes (Signed)
Remote pacemaker transmission.   

## 2021-01-11 ENCOUNTER — Other Ambulatory Visit: Payer: Self-pay

## 2021-01-11 ENCOUNTER — Ambulatory Visit
Admission: RE | Admit: 2021-01-11 | Discharge: 2021-01-11 | Disposition: A | Discharge status: Home / Self Care | Payer: BC Managed Care – PPO | Source: Ambulatory Visit | Attending: Family Medicine | Admitting: Family Medicine

## 2021-01-11 DIAGNOSIS — R911 Solitary pulmonary nodule: Secondary | ICD-10-CM

## 2021-01-11 MED ORDER — IOPAMIDOL (ISOVUE-370) INJECTION 76%
75.0000 mL | Freq: Once | INTRAVENOUS | Status: AC | PRN
  Administered 2021-01-11: 75 mL via INTRAVENOUS

## 2021-02-01 ENCOUNTER — Encounter: Payer: Self-pay | Admitting: Internal Medicine

## 2021-02-01 ENCOUNTER — Ambulatory Visit: Payer: BC Managed Care – PPO | Admitting: Internal Medicine

## 2021-02-01 ENCOUNTER — Other Ambulatory Visit: Payer: Self-pay

## 2021-02-01 VITALS — BP 134/86 | HR 71 | Ht 68.0 in | Wt 243.6 lb

## 2021-02-01 DIAGNOSIS — I480 Paroxysmal atrial fibrillation: Secondary | ICD-10-CM

## 2021-02-01 DIAGNOSIS — I442 Atrioventricular block, complete: Secondary | ICD-10-CM | POA: Diagnosis not present

## 2021-02-01 DIAGNOSIS — I10 Essential (primary) hypertension: Secondary | ICD-10-CM | POA: Diagnosis not present

## 2021-02-01 DIAGNOSIS — Z950 Presence of cardiac pacemaker: Secondary | ICD-10-CM | POA: Diagnosis not present

## 2021-02-01 NOTE — Patient Instructions (Addendum)
Medication Instructions:  Your physician recommends that you continue on your current medications as directed. Please refer to the Current Medication list given to you today.  Labwork: None ordered.  Testing/Procedures: None ordered.  Follow-Up: Your physician wants you to follow-up in: one year with Lewayne Bunting, MD in [place]   Remote monitoring is used to monitor your Pacemaker from home. This monitoring reduces the number of office visits required to check your device to one time per year. It allows [place] to keep an eye on the functioning of your device to ensure it is working properly. You are scheduled for a device check from home on 03/15/2021. You may send your transmission at any time that day. If you have a wireless device, the transmission will be sent automatically. After your physician reviews your transmission, you will receive a postcard with your next transmission date.  Any Other Special Instructions Will Be Listed Below (If Applicable).  If you need a refill on your cardiac medications before your next appointment, please call your pharmacy.

## 2021-02-01 NOTE — Progress Notes (Signed)
HPI Mr. Coone returns today for follwoup. He is a pleasant 65 yo man with a h/o CAD, s/p CABG, HTN, and gout. He has sinus node dysfunction as well as heart block, s/p PPM insertion. He has done well in the interim except he has retired and gained weight. He notes episodes of non-exertional chest pain, which is different from what he experienced prior to CABG.   Allergies  Allergen Reactions  . Sulfonamide Derivatives Hives     Current Outpatient Medications  Medication Sig Dispense Refill  . allopurinol (ZYLOPRIM) 300 MG tablet Take 300 mg by mouth every evening.    [name] azithromycin (ZITHROMAX) 250 MG tablet Take 1 tablet (250 mg total) by mouth daily. 4 tablet 0  . COLCRYS 0.6 MG tablet Take 0.6 mg by mouth daily as needed (Gout).   0  . cyclobenzaprine (FLEXERIL) 10 MG tablet Take 1 tablet (10 mg total) by mouth 3 (three) times daily as needed for muscle spasms. 20 tablet 0  . guaiFENesin-codeine 100-10 MG/5ML syrup Take 10 mLs by mouth every 6 (six) hours as needed for cough. 180 mL 0  . loratadine (CLARITIN) 10 MG tablet Take 10 mg by mouth daily.    . metoprolol succinate (TOPROL-XL) 25 MG 24 hr tablet TAKE 1 TABLET BY MOUTH EVERY DAY. MAY TAKE 1/2 TO 1 TABLET EXTRA FOR PALITATTIONS 60 tablet 11  . nitroGLYCERIN (NITROLINGUAL) 0.4 MG/SPRAY spray USE 1 SPRAY UNDER THE TONGUE EVERY 5 MINUTES AS NEEDED FOR CHEST PAIN 12 g 3  . omeprazole (PRILOSEC) 20 MG capsule Take 20 mg by mouth daily.    . QUEtiapine (SEROQUEL) 300 MG tablet Take 300 mg by mouth at bedtime.    . rivaroxaban (XARELTO) 20 MG TABS tablet Take 1 tablet (20 mg total) by mouth daily with supper. 30 tablet   . rosuvastatin (CRESTOR) 10 MG tablet TAKE 1 TABLET BY MOUTH EVERY DAY 30 tablet 0  . tamsulosin (FLOMAX) 0.4 MG CAPS capsule Take 0.4 mg by mouth daily.   11  . temazepam (RESTORIL) 30 MG capsule Take 30 mg by mouth at bedtime.   5  . valsartan (DIOVAN) 320 MG tablet Take 1 tablet by mouth daily.     No  current facility-administered medications for this visit.     Past Medical History:  Diagnosis Date  . ALLERGIC RHINITIS 05/14/2010   Qualifier: Diagnosis of  By: Garnette Czech PA, Dawn    . Asthma   . Asthma   . Atrial fibrillation [facility])    new November 2016  . Bipolar 1 disorder (HCC)    "put me on this after I got out of drug rehab 11/18/2005 and couldn't sleep; added anxiety RX and have been sleeping fine since"  . Bradycardia    Very limited Wenkebach on event recorder November 2010  . CAD (coronary artery disease) 2006   3v CAD >> s/p CABG in [place], patent LIMA to LAD and SVG to diagonal with 40% stenosis 2010 // Nuc 2/14: low risk, EF 56 // LHC 3/16: patent L-LAD, patent S-Dx // Echo 3/16: mild conc LVH, EF 60-65, Gr 1 DD, trivial TR, mild LAE // LHC 12/19: oLAD 100, pLCx 20, S-D1 patent, L-LAD patent, EF 45  . Cardiac pacemaker in situ 08/18/2018  . CHB (complete heart block) (HCC) 12/02/2014  . Chest pain with moderate risk of acute coronary syndrome   . Chronic systolic CHF (congestive heart failure) (HCC) 09/15/2018   EF 45 by LHC in  12/19 // Echo 09/2018: inf HK, EF 45-50, mod LAE  . Dyslipidemia    mixed   . ED (erectile dysfunction)   . Ejection fraction    EF 50%, catheterization, September, 2010   //   EF 60-65%, echo, December 03, 2014   . Essential hypertension   . GERD (gastroesophageal reflux disease)   . Gout   . History of stomach ulcers 1980's  . Hx of CABG    2006, [place]   . Memory loss-SVD on MRI March 2015 11/18/2013  . Mixed hyperlipidemia    pt states "they took me off q med they had me on for high cholesterol; my good cholesterol is low" (09/13/2015)  . Myocardial infarction (HCC) 2006  . OSA (obstructive sleep apnea)    patient denies, stated had a sleep study and he was told he did not have    . PAF (paroxysmal atrial fibrillation) (HCC) 08/18/2018  . Palpitations    Event recorder November, 2010, no significant arrhythmias   . Pneumonia 2015  .  Pneumonia 1990's X 4 in one year   "double pneumonia"  . Presence of permanent cardiac pacemaker   . Second degree AV block 09/13/2015  . Second degree AV block, Mobitz type I 03/10/2013   Patient had Mobitz 1 heart block that was asymptomatic. This resolved off beta blockade while in the hospital March, 2016. Plan to keep the patient off beta blockers. Patient will be seen in follow-up by Dr. Ladona Ridgel and the [place] office.   . 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(HCC)     ROS:   All systems reviewed and negative except as noted in the HPI.   Past Surgical History:  Procedure Laterality Date  . BACK SURGERY    . CARDIAC CATHETERIZATION     "I've had quite a few; don't remember any stents" (09/13/2015)  . CORONARY ANGIOPLASTY    . CORONARY ARTERY BYPASS GRAFT  2006   "CABG X2, in [place]"  . EP IMPLANTABLE DEVICE N/A 09/13/2015   Procedure: Pacemaker Implant;  Surgeon: Hillis Range, MD;  Location: [facility] INVASIVE CV LAB;  Service: Cardiovascular;  Laterality: N/A;  . INSERT / REPLACE / REMOVE PACEMAKER  09/13/2015   [employer]tr[employer]  . LEFT HEART CATH AND CORS/GRAFTS ANGIOGRAPHY N/A 08/26/2018   Procedure: LEFT HEART CATH AND CORS/GRAFTS ANGIOGRAPHY;  Surgeon: Lennette Bihari, MD;  Location: [facility] INVASIVE CV LAB;  Service: Cardiovascular;  Laterality: N/A;  . LEFT HEART CATHETERIZATION WITH CORONARY/GRAFT ANGIOGRAM N/A 12/05/2014   Procedure: LEFT HEART CATHETERIZATION WITH Isabel Caprice;  Surgeon: Peter M Swaziland, MD;  Location: [facility] CATH LAB;  Service: Cardiovascular;  Laterality: N/A;  . LEG SURGERY Left 1961   "had 5 boils come up on my leg; they had to cut them out and put a drainage tube in there"  . LUMBAR DISC SURGERY  X 3   "ruptured disc"  . MAXIMUM ACCESS (MAS)POSTERIOR LUMBAR INTERBODY FUSION (PLIF) 1 LEVEL  ~ 2003   "L4-5; put rods & screws in"     Family History  Problem Relation Age of Onset  . COPD Mother   . Lung cancer Mother   . Stroke Mother   . Heart attack  Mother   . Heart disease Father   . Heart attack Father   . Hyperlipidemia Brother   . Hypertension Brother   . Hypertension Brother   . Hypertension Sister      Social History   Socioeconomic History  . Marital status: Married    Spouse  name: Not on file  . Number of children: Not on file  . Years of education: Not on file  . Highest education level: Not on file  Occupational History  . Not on file  Tobacco Use  . Smoking status: Former Smoker    Packs/day: 2.00    Years: 17.00    Pack years: 34.00    Types: Cigarettes    Quit date: 03/13/2017    Years since quitting: 3.8  . Smokeless tobacco: Current User    Types: Snuff  . Tobacco comment: 09/13/2015 "quit chewing tobacco August 2016"  Substance and Sexual Activity  . Alcohol use: Yes    Alcohol/week: 0.0 standard drinks    Comment: 09/13/2015 "got out of alcohol rehab 11/18/2005"  . Drug use: Yes    Comment: 09/13/2015 "got out of rehab for pills 11/18/2005"  . Sexual activity: Yes    Birth control/protection: None  Other Topics Concern  . 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    Social Determinants of Health   Financial Resource Strain: Not on file  Food Insecurity: Not on file  Transportation Needs: Not on file  Physical Activity: Not on file  Stress: Not on file  Social Connections: Not on file  Intimate Partner Violence: Not on file     BP 134/86   Pulse 71   Ht 5\' 8"  (1.727 m)   Wt 243 lb 9.6 oz (110.5 kg)   SpO2 95%   BMI 37.04 kg/m   Physical Exam:  Well appearing NAD HEENT: Unremarkable Neck:  No JVD, no thyromegally Lymphatics:  No adenopathy Back:  No CVA tenderness Lungs:  Clear HEART:  Regular rate rhythm, no murmurs, no rubs, no clicks Abd:  soft, positive bowel sounds, no organomegally, no rebound, no guarding Ext:  2 plus pulses, no edema, no cyanosis, no clubbing Skin:  No rashes no nodules Neuro:  CN II through XII  intact, motor grossly intact  EKG - nsr with LBBB  DEVICE  Normal device function.  See PaceArt for details.   Assess/Plan: 1. Heart block - he is asymptomatic s/p PPM insertion. 2. PPM - his medtronic DDD PM is working normally. We will recheck in several months. 3. Chest pain - his symptoms are non-exertional and I encouraged him to call if they change and become so. 4. Obesity - he states that he has gained 50 lbs in the last year since he retired. I discussed intermittent fasting with the patient.   Catelin Manthe,MD

## 2021-03-15 ENCOUNTER — Ambulatory Visit (INDEPENDENT_AMBULATORY_CARE_PROVIDER_SITE_OTHER): Payer: BC Managed Care – PPO

## 2021-03-15 DIAGNOSIS — I442 Atrioventricular block, complete: Secondary | ICD-10-CM | POA: Diagnosis not present

## 2021-03-19 LAB — CUP PACEART REMOTE DEVICE CHECK
Battery Remaining Longevity: 44 mo
Battery Voltage: 2.98 V
Brady Statistic AP VP Percent: 18.16 %
Brady Statistic AP VS Percent: 0.02 %
Brady Statistic AS VP Percent: 80.75 %
Brady Statistic AS VS Percent: 1.08 %
Brady Statistic RA Percent Paced: 18.09 %
Brady Statistic RV Percent Paced: 98.8 %
Date Time Interrogation Session: 20220701135841
Implantable Lead Implant Date: 20161228
Implantable Lead Implant Date: 20161228
Implantable Lead Location: 753859
Implantable Lead Location: 753860
Implantable Lead Model: 5076
Implantable Lead Model: 5076
Implantable Pulse Generator Implant Date: 20161228
Lead Channel Impedance Value: 342 Ohm
Lead Channel Impedance Value: 399 Ohm
Lead Channel Impedance Value: 437 Ohm
Lead Channel Impedance Value: 456 Ohm
Lead Channel Pacing Threshold Amplitude: 0.75 V
Lead Channel Pacing Threshold Amplitude: 1.125 V
Lead Channel Pacing Threshold Pulse Width: 0.4 ms
Lead Channel Pacing Threshold Pulse Width: 0.4 ms
Lead Channel Sensing Intrinsic Amplitude: 1.375 mV
Lead Channel Sensing Intrinsic Amplitude: 1.375 mV
Lead Channel Sensing Intrinsic Amplitude: 15.625 mV
Lead Channel Sensing Intrinsic Amplitude: 15.625 mV
Lead Channel Setting Pacing Amplitude: 2.25 V
Lead Channel Setting Pacing Amplitude: 2.5 V
Lead Channel Setting Pacing Pulse Width: 0.4 ms
Lead Channel Setting Sensing Sensitivity: 2.8 mV

## 2021-04-03 NOTE — Progress Notes (Signed)
Remote pacemaker transmission.   

## 2021-06-14 ENCOUNTER — Ambulatory Visit (INDEPENDENT_AMBULATORY_CARE_PROVIDER_SITE_OTHER): Payer: BC Managed Care – PPO

## 2021-06-14 DIAGNOSIS — I442 Atrioventricular block, complete: Secondary | ICD-10-CM | POA: Diagnosis not present

## 2021-06-18 LAB — CUP PACEART REMOTE DEVICE CHECK
Battery Remaining Longevity: 44 mo
Battery Voltage: 2.97 V
Brady Statistic AP VP Percent: 19.37 %
Brady Statistic AP VS Percent: 0.02 %
Brady Statistic AS VP Percent: 79.63 %
Brady Statistic AS VS Percent: 0.98 %
Brady Statistic RA Percent Paced: 19.26 %
Brady Statistic RV Percent Paced: 98.87 %
Date Time Interrogation Session: 20220930115957
Implantable Lead Implant Date: 20161228
Implantable Lead Implant Date: 20161228
Implantable Lead Location: 753859
Implantable Lead Location: 753860
Implantable Lead Model: 5076
Implantable Lead Model: 5076
Implantable Pulse Generator Implant Date: 20161228
Lead Channel Impedance Value: 323 Ohm
Lead Channel Impedance Value: 418 Ohm
Lead Channel Impedance Value: 437 Ohm
Lead Channel Impedance Value: 475 Ohm
Lead Channel Pacing Threshold Amplitude: 0.875 V
Lead Channel Pacing Threshold Amplitude: 1 V
Lead Channel Pacing Threshold Pulse Width: 0.4 ms
Lead Channel Pacing Threshold Pulse Width: 0.4 ms
Lead Channel Sensing Intrinsic Amplitude: 1.125 mV
Lead Channel Sensing Intrinsic Amplitude: 1.125 mV
Lead Channel Sensing Intrinsic Amplitude: 13.5 mV
Lead Channel Sensing Intrinsic Amplitude: 13.5 mV
Lead Channel Setting Pacing Amplitude: 2.25 V
Lead Channel Setting Pacing Amplitude: 2.5 V
Lead Channel Setting Pacing Pulse Width: 0.4 ms
Lead Channel Setting Sensing Sensitivity: 2.8 mV

## 2021-06-21 NOTE — Progress Notes (Signed)
Remote pacemaker transmission.   

## 2021-07-09 ENCOUNTER — Other Ambulatory Visit: Payer: Self-pay

## 2021-07-09 ENCOUNTER — Encounter: Payer: Self-pay | Admitting: Urology

## 2021-07-09 ENCOUNTER — Ambulatory Visit: Payer: BC Managed Care – PPO | Admitting: Urology

## 2021-07-09 VITALS — BP 126/83 | HR 80 | Temp 98.4°F | Wt 239.0 lb

## 2021-07-09 DIAGNOSIS — N400 Enlarged prostate without lower urinary tract symptoms: Secondary | ICD-10-CM | POA: Diagnosis not present

## 2021-07-09 DIAGNOSIS — N401 Enlarged prostate with lower urinary tract symptoms: Secondary | ICD-10-CM | POA: Diagnosis not present

## 2021-07-09 DIAGNOSIS — R3912 Poor urinary stream: Secondary | ICD-10-CM

## 2021-07-09 DIAGNOSIS — N138 Other obstructive and reflux uropathy: Secondary | ICD-10-CM

## 2021-07-09 LAB — URINALYSIS, ROUTINE W REFLEX MICROSCOPIC
Bilirubin, UA: NEGATIVE
Ketones, UA: NEGATIVE
Leukocytes,UA: NEGATIVE
Nitrite, UA: NEGATIVE
Protein,UA: NEGATIVE
RBC, UA: NEGATIVE
Specific Gravity, UA: >1.03 — ABNORMAL HIGH (ref 1.005–1.030)
Urobilinogen, Ur: 0.2 mg/dL (ref 0.2–1.0)
pH, UA: 5.5 (ref 5.0–7.5)

## 2021-07-09 LAB — BLADDER SCAN AMB NON-IMAGING: Scan Result: 56

## 2021-07-09 MED ORDER — SILODOSIN 8 MG PO CAPS: 8.0000 mg | ORAL_CAPSULE | Freq: Every day | ORAL | 11 refills | Status: DC

## 2021-07-09 NOTE — Progress Notes (Signed)
post void residual=56 

## 2021-07-09 NOTE — Patient Instructions (Signed)
Benign Prostatic Hyperplasia Benign prostatic hyperplasia (BPH) is an enlarged prostate gland that is caused by the normal aging process and not by cancer. The prostate is a walnut-sized gland that is involved in the production of semen. It is located in front of the rectum and below the bladder. The bladder stores urine and the urethra is the tube that carries the urine out of the body. The prostate may get bigger as a man gets older. An enlarged prostate can press on the urethra. This can make it harder to pass urine. The build-up of urine in the bladder can cause infection. Back pressure and infection may progress to bladder damage and kidney (renal) failure. What are the causes? This condition is part of a normal aging process. However, not all men develop problems from this condition. If the prostate enlarges away from the urethra, urine flow will not be blocked. If it enlarges toward the urethra and compresses it, there will be problems passing urine. What increases the risk? This condition is more likely to develop in men over the age of 50 years. What are the signs or symptoms? Symptoms of this condition include: Getting up often during the night to urinate. Needing to urinate frequently during the day. Difficulty starting urine flow. Decrease in size and strength of your urine stream. Leaking (dribbling) after urinating. Inability to pass urine. This needs immediate treatment. Inability to completely empty your bladder. Pain when you pass urine. This is more common if there is also an infection. Urinary tract infection (UTI). How is this diagnosed? This condition is diagnosed based on your medical history, a physical exam, and your symptoms. Tests will also be done, such as: A post-void bladder scan. This measures any amount of urine that may remain in your bladder after you finish urinating. A digital rectal exam. In a rectal exam, your health care provider checks your prostate by  putting a lubricated, gloved finger into your rectum to feel the back of your prostate gland. This exam detects the size of your gland and any abnormal lumps or growths. An exam of your urine (urinalysis). A prostate specific antigen (PSA) screening. This is a blood test used to screen for prostate cancer. An ultrasound. This test uses sound waves to electronically produce a picture of your prostate gland. Your health care provider may refer you to a specialist in kidney and prostate diseases (urologist). How is this treated? Once symptoms begin, your health care provider will monitor your condition (active surveillance or watchful waiting). Treatment for this condition will depend on the severity of your condition. Treatment may include: Observation and yearly exams. This may be the only treatment needed if your condition and symptoms are mild. Medicines to relieve your symptoms, including: Medicines to shrink the prostate. Medicines to relax the muscle of the prostate. Surgery in severe cases. Surgery may include: Prostatectomy. In this procedure, the prostate tissue is removed completely through an open incision or with a laparoscope or robotics. Transurethral resection of the prostate (TURP). In this procedure, a tool is inserted through the opening at the tip of the penis (urethra). It is used to cut away tissue of the inner core of the prostate. The pieces are removed through the same opening of the penis. This removes the blockage. Transurethral incision (TUIP). In this procedure, small cuts are made in the prostate. This lessens the prostate's pressure on the urethra. Transurethral microwave thermotherapy (TUMT). This procedure uses microwaves to create heat. The heat destroys and removes a   small amount of prostate tissue. Transurethral needle ablation (TUNA). This procedure uses radio frequencies to destroy and remove a small amount of prostate tissue. Interstitial laser coagulation (ILC).  This procedure uses a laser to destroy and remove a small amount of prostate tissue. Transurethral electrovaporization (TUVP). This procedure uses electrodes to destroy and remove a small amount of prostate tissue. Prostatic urethral lift. This procedure inserts an implant to push the lobes of the prostate away from the urethra. Follow these instructions at home: Take over-the-counter and prescription medicines only as told by your health care provider. Monitor your symptoms for any changes. Contact your health care provider with any changes. Avoid drinking large amounts of liquid before going to bed or out in public. Avoid or reduce how much caffeine or alcohol you drink. Give yourself time when you urinate. Keep all follow-up visits as told by your health care provider. This is important. Contact a health care provider if: You have unexplained back pain. Your symptoms do not get better with treatment. You develop side effects from the medicine you are taking. Your urine becomes very dark or has a bad smell. Your lower abdomen becomes distended and you have trouble passing your urine. Get help right away if: You have a fever or chills. You suddenly cannot urinate. You feel lightheaded, or very dizzy, or you faint. There are large amounts of blood or clots in the urine. Your urinary problems become hard to manage. You develop moderate to severe low back or flank pain. The flank is the side of your body between the ribs and the hip. These symptoms may represent a serious problem that is an emergency. Do not wait to see if the symptoms will go away. Get medical help right away. Call your local emergency services (911 in the [place] Do not drive yourself to the hospital. Summary Benign prostatic hyperplasia (BPH) is an enlarged prostate that is caused by the normal aging process and not by cancer. An enlarged prostate can press on the urethra. This can make it hard to pass urine. This  condition is part of a normal aging process and is more likely to develop in men over the age of 50 years. Get help right away if you suddenly cannot urinate. This information is not intended to replace advice given to you by your health care provider. Make sure you discuss any questions you have with your health care provider. Document Revised: 12/13/2020 Document Reviewed: 05/11/2020 Elsevier Patient Education  2022 [employer] Inc.  

## 2021-07-09 NOTE — Progress Notes (Signed)
Urological Symptom Review  Patient is experiencing the following symptoms: Burning/pain with urination Leakage of urine Stream starts and stops Trouble starting stream Have to strain to urinate Blood in urine Weak stream   Review of Systems  Gastrointestinal (upper)  : Negative for upper GI symptoms  Gastrointestinal (lower) : Negative for lower GI symptoms  Constitutional : Negative for symptoms  Skin: Negative for skin symptoms  Eyes: Negative for eye symptoms  Ear/Nose/Throat : Negative for Ear/Nose/Throat symptoms  Hematologic/Lymphatic: Swollen glands  Cardiovascular : Negative for cardiovascular symptoms  Respiratory : Cough Shortness of breath  Endocrine: Negative for endocrine symptoms  Musculoskeletal: Back pain Joint pain  Neurological: Headaches Dizziness  Psychologic: Negative for psychiatric symptoms Urological Symptom Review

## 2021-07-09 NOTE — Progress Notes (Signed)
07/09/2021 11:51 AM   [name] [name] 02/21/1956 [id number]  Referring provider: Wilfrid Lund, PA [address] [place],  [place] [address]  Weak urinary stream   HPI: [name] is a 65yo here for evaluation of BPH with weak urinary stream. He was previously seen at [facility] for BPH. He is currently on flomax 0.4mg  daily and if he forgets to take it his LUTS are miserable. IPSS 21 QOL 5. He has a weak urinary stream, straining to urinate and feeling of incomplete emptying. No hematuria or dysuria. No other complaints today.  He is on xarelto.   His records from [facility] are as follows: My PSA is elevated above the normal range. HPI: [name] [name] is a 65 year-old male established patient who is here for an elevated PSA.  07/15/2018: PSA was 5.1 this year. No previous PSAs.   08/05/2019: PSA checked at PCP earlier this year was 2.88. Benign DRE today.   His PSA is 5.1. When the elevated PSA was drawn, he reports having slow urine flow. He has not had a prostate nodule on a physical examination. He has not had recurrent prostate infections or chronic prostatitis.   He has not been on antibiotics for prostate infections previously. He has not had a prostate biopsy done.   CC: I have weakness and slowing of my urinary stream. HPI: 08/05/2019: He started tamsulosin last year for obstructive voiding symptoms which helped significantly. He primarily has urgency and difficulty starting in the morning that resolved within 1-2 hours after awakening. He does not have nocturia, dysuria, visible blood in the urine.    He first noticed the symptom approximately 06/16/2017. He does have to strain or bear down to start his urinary stream. He does have to wait a long time to start his urinary stream.   He does have an abnormal sensation when needing to urinate. He does not urinate more frequently than once every 4 hours in the daytime. He does dribble at the end of urination. He does have a  split stream when he urinates.   He has been treated with Flomax. The patient has never had a surgical procedure for bladder outlet obstruction to his prostate.   AUA Symptom Score: Less than 20% of the time he has the sensation of not emptying his bladder completely when finished urinating. Less than 20% of the time and 50% of the time he has to urinate again fewer than two hours after he has finished urinating. Less than 20% of the time he has to start and stop again several times when he urinates. He never finds it difficult to postpone urination. 50% of the time he has a weak urinary stream. 50% of the time he has to push or strain to begin urination. He never has to get up to urinate from the time he goes to bed until the time he gets up in the morning.   Calculated AUA Symptom Score: 9    PMH: Past Medical History:  Diagnosis Date   ALLERGIC RHINITIS 05/14/2010   Qualifier: Diagnosis of  By: Garnette Czech PA, Dawn     Asthma    Asthma    Atrial fibrillation [facility])    new November 2016   Bipolar 1 disorder [facility])    "put me on this after I got out of drug rehab 11/18/2005 and couldn't sleep; added anxiety RX and have been sleeping fine since"   Bradycardia    Very limited Wenkebach on event  recorder November 2010   CAD (coronary artery disease) 2006   3v CAD >> s/p CABG in [place], patent LIMA to LAD and SVG to diagonal with 40% stenosis 2010 // Nuc 2/14: low risk, EF 63 // LHC 3/16: patent L-LAD, patent S-Dx // Echo 3/16: mild conc LVH, EF 60-65, Gr 1 DD, trivial TR, mild LAE // LHC 12/19: oLAD 100, pLCx 20, S-D1 patent, L-LAD patent, EF 45   Cardiac pacemaker in situ 08/18/2018   CHB (complete heart block) (HCC) 12/02/2014   Chest pain with moderate risk of acute coronary syndrome    Chronic systolic CHF (congestive heart failure) (HCC) 09/15/2018   EF 45 by LHC in 12/19 // Echo 09/2018: inf HK, EF 45-50, mod LAE   Dyslipidemia    mixed    ED (erectile dysfunction)    Ejection fraction     EF 50%, catheterization, September, 2010   //   EF 60-65%, echo, December 03, 2014    Essential hypertension    GERD (gastroesophageal reflux disease)    Gout    History of stomach ulcers 1980's   Hx of CABG    2006, [place]    Memory loss-SVD on MRI March 2015 11/18/2013   Mixed hyperlipidemia    pt states "they took me off q med they had me on for high cholesterol; my good cholesterol is low" (09/13/2015)   Myocardial infarction (HCC) 2006   OSA (obstructive sleep apnea)    patient denies, stated had a sleep study and he was told he did not have     PAF (paroxysmal atrial fibrillation) (HCC) 08/18/2018   Palpitations    Event recorder November, 2010, no significant arrhythmias    Pneumonia 2015   Pneumonia 1990's X 4 in one year   "double pneumonia"   Presence of permanent cardiac pacemaker    Second degree AV block 09/13/2015   Second degree AV block, Mobitz type I 03/10/2013   Patient had Mobitz 1 heart block that was asymptomatic. This resolved off beta blockade while in the hospital March, 2016. Plan to keep the patient off beta blockers. Patient will be seen in follow-up by Dr. Ladona Ridgel and the [place] office.    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[facility])     Surgical History: Past Surgical History:  Procedure Laterality Date   BACK SURGERY     CARDIAC CATHETERIZATION     "I've had quite a few; don't remember any stents" (09/13/2015)   CORONARY ANGIOPLASTY     CORONARY ARTERY BYPASS GRAFT  2006   "CABG X2, in [place]"   EP IMPLANTABLE DEVICE N/A 09/13/2015   Procedure: Pacemaker Implant;  Surgeon: Hillis Range, MD;  Location: [facility] INVASIVE CV LAB;  Service: Cardiovascular;  Laterality: N/A;   INSERT / REPLACE / REMOVE PACEMAKER  09/13/2015   [employer]   LEFT HEART CATH AND CORS/GRAFTS ANGIOGRAPHY N/A 08/26/2018   Procedure: LEFT HEART CATH AND CORS/GRAFTS ANGIOGRAPHY;  Surgeon: Lennette Bihari, MD;  Location: [facility] INVASIVE CV LAB;  Service: Cardiovascular;  Laterality: N/A;   LEFT  HEART CATHETERIZATION WITH CORONARY/GRAFT ANGIOGRAM N/A 12/05/2014   Procedure: LEFT HEART CATHETERIZATION WITH Isabel Caprice;  Surgeon: Peter M Swaziland, MD;  Location: [facility] CATH LAB;  Service: Cardiovascular;  Laterality: N/A;   LEG SURGERY Left 1961   "had 5 boils come up on my leg; they had to cut them out and put a drainage tube in there"   LUMBAR DISC SURGERY  X 3   "ruptured disc"   MAXIMUM ACCESS (  MAS)POSTERIOR LUMBAR INTERBODY FUSION (PLIF) 1 LEVEL  ~ 2003   "L4-5; put rods & screws in"    Home Medications:  Allergies as of 07/09/2021       Reactions   Sulfonamide Derivatives Hives        Medication List        Accurate as of July 09, 2021 11:51 AM. If you have any questions, ask your nurse or doctor.          STOP taking these medications    azithromycin 250 MG tablet Commonly known as: ZITHROMAX Stopped by: Wilkie Aye, MD   cyclobenzaprine 10 MG tablet Commonly known as: FLEXERIL Stopped by: Wilkie Aye, MD   guaiFENesin-codeine 100-10 MG/5ML syrup Stopped by: Wilkie Aye, MD       TAKE these medications    allopurinol 300 MG tablet Commonly known as: ZYLOPRIM Take 300 mg by mouth every evening.   Colcrys 0.6 MG tablet Generic drug: colchicine Take 0.6 mg by mouth daily as needed (Gout).   loratadine 10 MG tablet Commonly known as: CLARITIN Take 10 mg by mouth daily.   metoprolol succinate 25 MG 24 hr tablet Commonly known as: TOPROL-XL TAKE 1 TABLET BY MOUTH EVERY DAY. MAY TAKE 1/2 TO 1 TABLET EXTRA FOR PALITATTIONS   nitroGLYCERIN 0.4 MG/SPRAY spray Commonly known as: NITROLINGUAL USE 1 SPRAY UNDER THE TONGUE EVERY 5 MINUTES AS NEEDED FOR CHEST PAIN   omeprazole 20 MG capsule Commonly known as: PRILOSEC Take 20 mg by mouth daily.   QUEtiapine 300 MG tablet Commonly known as: SEROQUEL Take 300 mg by mouth at bedtime.   rivaroxaban 20 MG Tabs tablet Commonly known as: XARELTO Take 1 tablet (20 mg total)  by mouth daily with supper.   rosuvastatin 10 MG tablet Commonly known as: CRESTOR TAKE 1 TABLET BY MOUTH EVERY DAY   tamsulosin 0.4 MG Caps capsule Commonly known as: FLOMAX Take 0.4 mg by mouth daily.   temazepam 30 MG capsule Commonly known as: RESTORIL Take 30 mg by mouth at bedtime.   valsartan 320 MG tablet Commonly known as: DIOVAN Take 1 tablet by mouth daily.        Allergies:  Allergies  Allergen Reactions   Sulfonamide Derivatives Hives    Family History: Family History  Problem Relation Age of Onset   COPD Mother    Lung cancer Mother    Stroke Mother    Heart attack Mother    Heart disease Father    Heart attack Father    Hyperlipidemia Brother    Hypertension Brother    Hypertension Brother    Hypertension Sister     Social History:  reports that he quit smoking about 4 years ago. His smoking use included cigarettes. He has a 34.00 pack-year smoking history. His smokeless tobacco use includes snuff. He reports current alcohol use. He reports current drug use.  ROS: All other review of systems were reviewed and are negative except what is noted above in HPI  Physical Exam: BP 126/83   Pulse 80   Temp 98.4 F (36.9 C)   Wt 239 lb (108.4 kg)   BMI 36.34 kg/m   Constitutional:  Alert and oriented, No acute distress. HEENT: [place] AT, moist mucus membranes.  Trachea midline, no masses. Cardiovascular: No clubbing, cyanosis, or edema. Respiratory: Normal respiratory effort, no increased work of breathing. GI: Abdomen is soft, nontender, nondistended, no abdominal masses GU: No CVA tenderness. Circumcised phallus. No masses/lesions on penis, testis, scrotum. Prostate 60g  smooth no nodules no induration.  Lymph: No cervical or inguinal lymphadenopathy. Skin: No rashes, bruises or suspicious lesions. Neurologic: Grossly intact, no focal deficits, moving all 4 extremities. Psychiatric: Normal mood and affect.  Laboratory Data: Lab Results   Component Value Date   WBC 9.1 12/21/2019   HGB 17.1 (H) 12/21/2019   HCT 48.3 12/21/2019   MCV 90.6 12/21/2019   PLT 249 12/21/2019    Lab Results  Component Value Date   CREATININE 0.88 12/21/2019    No results found for: PSA  No results found for: TESTOSTERONE  Lab Results  Component Value Date   HGBA1C 5.3 12/02/2014    Urinalysis    Component Value Date/Time   COLORURINE YELLOW 12/21/2019 2130   APPEARANCEUR CLEAR 12/21/2019 2130   LABSPEC 1.013 12/21/2019 2130   PHURINE 6.0 12/21/2019 2130   GLUCOSEU NEGATIVE 12/21/2019 2130   HGBUR NEGATIVE 12/21/2019 2130   BILIRUBINUR NEGATIVE 12/21/2019 2130   KETONESUR NEGATIVE 12/21/2019 2130   PROTEINUR NEGATIVE 12/21/2019 2130   NITRITE NEGATIVE 12/21/2019 2130   LEUKOCYTESUR TRACE (A) 12/21/2019 2130    Lab Results  Component Value Date   BACTERIA NONE SEEN 12/21/2019    Pertinent Imaging:  No results found for this or any previous visit.  No results found for this or any previous visit.  No results found for this or any previous visit.  No results found for this or any previous visit.  No results found for this or any previous visit.  No results found for this or any previous visit.  No results found for this or any previous visit.  Results for orders placed during the hospital encounter of 12/21/19  CT Renal Stone Study  Narrative CLINICAL DATA:  Flank pain.  EXAM: CT ABDOMEN AND PELVIS WITHOUT CONTRAST  TECHNIQUE: Multidetector CT imaging of the abdomen and pelvis was performed following the standard protocol without IV contrast.  COMPARISON:  None.  FINDINGS: Lower chest: 4 mm nodule is noted posteriorly in the right lower lobe.  Hepatobiliary: Mild cholelithiasis is noted. No biliary dilatation is noted. Hepatic steatosis is noted.  Pancreas: Unremarkable. No pancreatic ductal dilatation or surrounding inflammatory changes.  Spleen: Moderate splenomegaly is  noted.  Adrenals/Urinary Tract: Adrenal glands are unremarkable. Kidneys are normal, without renal calculi, focal lesion, or hydronephrosis. Bladder is unremarkable.  Stomach/Bowel: Stomach is within normal limits. Appendix appears normal. No evidence of bowel wall thickening, distention, or inflammatory changes.  Vascular/Lymphatic: No significant vascular findings are present. No enlarged abdominal or pelvic lymph nodes.  Reproductive: Mild prostatic enlargement is noted.  Other: No abdominal wall hernia or abnormality. No abdominopelvic ascites.  Musculoskeletal: No acute or significant osseous findings.  IMPRESSION: 1. Mild cholelithiasis. 2. Hepatic steatosis. 3. Moderate splenomegaly. 4. No hydronephrosis or renal obstruction is noted. 5. No renal or ureteral calculi are noted. 6. Mild prostatic enlargement. 7. 4 mm nodule seen posteriorly in right lower lobe. No follow-up needed if patient is low-risk. Non-contrast chest CT can be considered in 12 months if patient is high-risk. This recommendation follows the consensus statement: Guidelines for Management of Incidental Pulmonary Nodules Detected on CT Images: From the Fleischner Society 2017; Radiology 2017; 284:228-243.   Electronically Signed By: Lupita Raider M.D. On: 12/21/2019 18:52   Assessment & Plan:    1.  Benign prostatic hyperplasia with urinary obstruction -we will trial rapaflo 8mg  qhs. If this fails to improve his LUTS we will proceed with cystoscopy  2. Weak urinary stream -Rapaflo 8mg   qhs   No follow-ups on file.  Wilkie Aye, MD  [facility] [place]

## 2021-07-26 ENCOUNTER — Other Ambulatory Visit: Payer: Self-pay | Admitting: Family Medicine

## 2021-07-26 DIAGNOSIS — R911 Solitary pulmonary nodule: Secondary | ICD-10-CM

## 2021-08-17 ENCOUNTER — Ambulatory Visit
Admission: RE | Admit: 2021-08-17 | Discharge: 2021-08-17 | Disposition: A | Discharge status: Home / Self Care | Payer: BC Managed Care – PPO | Source: Ambulatory Visit | Attending: Family Medicine | Admitting: Family Medicine

## 2021-08-17 DIAGNOSIS — R911 Solitary pulmonary nodule: Secondary | ICD-10-CM

## 2021-08-29 ENCOUNTER — Other Ambulatory Visit: Payer: Self-pay

## 2021-08-29 ENCOUNTER — Encounter: Payer: Self-pay | Admitting: Urology

## 2021-08-29 ENCOUNTER — Ambulatory Visit: Payer: BC Managed Care – PPO | Admitting: Urology

## 2021-08-29 VITALS — BP 165/84 | HR 76

## 2021-08-29 DIAGNOSIS — N401 Enlarged prostate with lower urinary tract symptoms: Secondary | ICD-10-CM

## 2021-08-29 DIAGNOSIS — R3912 Poor urinary stream: Secondary | ICD-10-CM | POA: Diagnosis not present

## 2021-08-29 DIAGNOSIS — N400 Enlarged prostate without lower urinary tract symptoms: Secondary | ICD-10-CM

## 2021-08-29 DIAGNOSIS — N138 Other obstructive and reflux uropathy: Secondary | ICD-10-CM

## 2021-08-29 LAB — BLADDER SCAN AMB NON-IMAGING: Scan Result: 51

## 2021-08-29 MED ORDER — SILODOSIN 8 MG PO CAPS: 8.0000 mg | ORAL_CAPSULE | Freq: Every day | ORAL | 3 refills | Status: DC

## 2021-08-29 NOTE — Patient Instructions (Signed)

## 2021-08-29 NOTE — Progress Notes (Signed)
08/29/2021 11:07 AM   [name] [name] Nov 02, 1955 [id number]  Referring provider: Lois [name], [name],  [name] [id number]  Followup BPH    HPI: [name] is a 65yo here for followup for BPH with weak urinary stream. He was started on rapaflo 8mg  daily. IPSS 5 QOl 1. Nocturia 1-2x. Strong urinary stream. Urinary frequency and urgency resolved. Urinary hesitancy resolved. He is very happy with his urination.    PMH: Past Medical History:  Diagnosis Date   ALLERGIC RHINITIS 05/14/2010   Qualifier: Diagnosis of  By: Claybon Jabs PA, Dawn     Asthma    Asthma    Atrial fibrillation [facility])    new November 2016   Bipolar 1 disorder [facility])    "put me on this after I got out of drug rehab 11/18/2005 and couldn't sleep; added anxiety RX and have been sleeping fine since"   Bradycardia    Very limited Wenkebach on event recorder November 2010   CAD (coronary artery disease) 2006   3v CAD >> s/p CABG in [place], patent LIMA to LAD and SVG to diagonal with 40% stenosis 2010 // Nuc 2/14: low risk, EF 56 // LHC 3/16: patent L-LAD, patent S-Dx // Echo 3/16: mild conc LVH, EF 60-65, Gr 1 DD, trivial TR, mild LAE // LHC 12/19: oLAD 100, pLCx 20, S-D1 patent, L-LAD patent, EF 45   Cardiac pacemaker in situ 08/18/2018   CHB (complete heart block) ([place]) 12/02/2014   Chest pain with moderate risk of acute coronary syndrome    Chronic systolic CHF (congestive heart failure) ([place]) 09/15/2018   EF 45 by LHC in 12/19 // Echo 09/2018: inf HK, EF 45-50, mod LAE   Dyslipidemia    mixed    ED (erectile dysfunction)    Ejection fraction    EF 50%, catheterization, September, 2010   //   EF 60-65%, echo, December 03, 2014    Essential hypertension    GERD (gastroesophageal reflux disease)    Gout    History of stomach ulcers 1980's   Hx of CABG    2006, [place] loss-SVD on MRI March 2015 11/18/2013   Mixed hyperlipidemia    pt states "they took me off q med they had me on  for high cholesterol; my good cholesterol is low" (09/13/2015)   Myocardial infarction ([place]) 2006   OSA (obstructive sleep apnea)    patient denies, stated had a sleep study and he was told he did not have     PAF (paroxysmal atrial fibrillation) ([place]) 08/18/2018   Palpitations    Event recorder November, 2010, no significant arrhythmias    Pneumonia 2015   Pneumonia 1990's X 4 in one year   "double pneumonia"   Presence of permanent cardiac pacemaker    Second degree AV block 09/13/2015   Second degree AV block, Mobitz type I 03/10/2013   Patient had Mobitz 1 heart block that was asymptomatic. This resolved off beta blockade while in the hospital March, 2016. Plan to keep the patient off beta blockers. Patient will be seen in follow-up by [name] and the [place] office.    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[name])     Surgical History: Past Surgical History:  Procedure Laterality Date   BACK SURGERY     CARDIAC CATHETERIZATION     "I've had quite a few; don't remember any stents" (09/13/2015)   CORONARY ANGIOPLASTY     CORONARY ARTERY BYPASS  GRAFT  2006   "CABG X2, in [place]"   EP IMPLANTABLE DEVICE N/A 09/13/2015   Procedure: Pacemaker Implant;  Surgeon: [name] Grayer, MD;  Location: [facility] [facility];  Service: Cardiovascular;  Laterality: N/A;   INSERT / REPLACE / REMOVE PACEMAKER  09/13/2015   [employer]   LEFT HEART CATH AND CORS/GRAFTS ANGIOGRAPHY N/A 08/26/2018   Procedure: LEFT HEART CATH AND CORS/GRAFTS ANGIOGRAPHY;  Surgeon: [name] Sine, MD;  Location: [place] [facility];  Service: Cardiovascular;  Laterality: N/A;   LEFT HEART CATHETERIZATION WITH CORONARY/GRAFT ANGIOGRAM N/A 12/05/2014   Procedure: LEFT HEART CATHETERIZATION WITH Beatrix Fetters;  Surgeon: [name] M Martinique, MD;  Location: [facility] CATH LAB;  Service: Cardiovascular;  Laterality: N/A;   LEG SURGERY Left 1961   "had 5 boils come up on my leg; they had to cut them out and put a drainage tube in  there"   LUMBAR DISC SURGERY  X 3   "ruptured disc"   MAXIMUM ACCESS (MAS)POSTERIOR LUMBAR INTERBODY FUSION (PLIF) 1 LEVEL  ~ 2003   "L4-5; put rods & screws in"    Home Medications:  Allergies as of 08/29/2021       Reactions   Sulfonamide Derivatives Hives   Zolpidem Tartrate Other (See Comments)        Medication List        Accurate as of August 29, 2021 11:07 AM. If you have any questions, ask your nurse or doctor.          allopurinol 300 MG tablet Commonly known as: ZYLOPRIM Take 300 mg by mouth every evening.   Colcrys 0.6 MG tablet Generic drug: colchicine Take 0.6 mg by mouth daily as needed (Gout).   loratadine 10 MG tablet Commonly known as: CLARITIN Take 10 mg by mouth daily.   metoprolol succinate 25 MG 24 hr tablet Commonly known as: TOPROL-XL TAKE 1 TABLET BY MOUTH EVERY DAY. MAY TAKE 1/2 TO 1 TABLET EXTRA FOR PALITATTIONS   nitroGLYCERIN 0.4 MG/SPRAY spray Commonly known as: NITROLINGUAL USE 1 SPRAY UNDER THE TONGUE EVERY 5 MINUTES AS NEEDED FOR CHEST PAIN   omeprazole 20 MG capsule Commonly known as: PRILOSEC Take 20 mg by mouth daily.   QUEtiapine 300 MG tablet Commonly known as: SEROQUEL Take 300 mg by mouth at bedtime.   rivaroxaban 20 MG Tabs tablet Commonly known as: XARELTO Take 1 tablet (20 mg total) by mouth daily with supper.   rosuvastatin 10 MG tablet Commonly known as: CRESTOR TAKE 1 TABLET BY MOUTH EVERY DAY   silodosin 8 MG Caps capsule Commonly known as: RAPAFLO Take 1 capsule (8 mg total) by mouth at bedtime.   tamsulosin 0.4 MG Caps capsule Commonly known as: FLOMAX Take 0.4 mg by mouth daily.   temazepam 30 MG capsule Commonly known as: RESTORIL Take 30 mg by mouth at bedtime.   valsartan 320 MG tablet Commonly known as: DIOVAN Take 1 tablet by mouth daily.        Allergies:  Allergies  Allergen Reactions   Sulfonamide Derivatives Hives   Zolpidem Tartrate Other (See Comments)    Family  History: Family History  Problem Relation Age of Onset   COPD Mother    Lung cancer Mother    Stroke Mother    Heart attack Mother    Heart disease Father    Heart attack Father    Hyperlipidemia Brother    Hypertension Brother    Hypertension Brother    Hypertension Sister  Social History:  reports that he quit smoking about 4 years ago. His smoking use included cigarettes. He has a 34.00 pack-year smoking history. His smokeless tobacco use includes snuff. He reports current alcohol use. He reports current drug use.  ROS: All other review of systems were reviewed and are negative except what is noted above in HPI  Physical Exam: BP (!) 165/84    Pulse 76   Constitutional:  Alert and oriented, No acute distress. HEENT: [place] AT, moist mucus membranes.  Trachea midline, no masses. Cardiovascular: No clubbing, cyanosis, or edema. Respiratory: Normal respiratory effort, no increased work of breathing. GI: Abdomen is soft, nontender, nondistended, no abdominal masses GU: No CVA tenderness.  Lymph: No cervical or inguinal lymphadenopathy. Skin: No rashes, bruises or suspicious lesions. Neurologic: Grossly intact, no focal deficits, moving all 4 extremities. Psychiatric: Normal mood and affect.  Laboratory Data: Lab Results  Component Value Date   WBC 9.1 12/21/2019   HGB 17.1 (H) 12/21/2019   HCT 48.3 12/21/2019   MCV 90.6 12/21/2019   PLT 249 12/21/2019    Lab Results  Component Value Date   CREATININE 0.88 12/21/2019    No results found for: PSA  No results found for: TESTOSTERONE  Lab Results  Component Value Date   HGBA1C 5.3 12/02/2014    Urinalysis    Component Value Date/Time   COLORURINE YELLOW 12/21/2019 2130   APPEARANCEUR Clear 07/09/2021 1154   LABSPEC 1.013 12/21/2019 2130   PHURINE 6.0 12/21/2019 2130   GLUCOSEU 1+ (A) 07/09/2021 1154   HGBUR NEGATIVE 12/21/2019 2130   BILIRUBINUR Negative 07/09/2021 [place] 12/21/2019 2130    PROTEINUR Negative 07/09/2021 1154   PROTEINUR NEGATIVE 12/21/2019 2130   NITRITE Negative 07/09/2021 1154   NITRITE NEGATIVE 12/21/2019 2130   LEUKOCYTESUR Negative 07/09/2021 1154   LEUKOCYTESUR TRACE (A) 12/21/2019 2130    Lab Results  Component Value Date   LABMICR Comment 07/09/2021   BACTERIA NONE SEEN 12/21/2019    Pertinent Imaging:  No results found for this or any previous visit.  No results found for this or any previous visit.  No results found for this or any previous visit.  No results found for this or any previous visit.  No results found for this or any previous visit.  No results found for this or any previous visit.  No results found for this or any previous visit.  Results for orders placed during the hospital encounter of 12/21/19  CT Renal Stone Study  Narrative CLINICAL DATA:  Flank pain.  EXAM: CT ABDOMEN AND PELVIS WITHOUT CONTRAST  TECHNIQUE: Multidetector CT imaging of the abdomen and pelvis was performed following the standard protocol without IV contrast.  COMPARISON:  None.  FINDINGS: Lower chest: 4 mm nodule is noted posteriorly in the right lower lobe.  Hepatobiliary: Mild cholelithiasis is noted. No biliary dilatation is noted. Hepatic steatosis is noted.  Pancreas: Unremarkable. No pancreatic ductal dilatation or surrounding inflammatory changes.  Spleen: Moderate splenomegaly is noted.  Adrenals/Urinary Tract: Adrenal glands are unremarkable. Kidneys are normal, without renal calculi, focal lesion, or hydronephrosis. Bladder is unremarkable.  Stomach/Bowel: Stomach is within normal limits. Appendix appears normal. No evidence of bowel wall thickening, distention, or inflammatory changes.  Vascular/Lymphatic: No significant vascular findings are present. No enlarged abdominal or pelvic lymph nodes.  Reproductive: Mild prostatic enlargement is noted.  Other: No abdominal wall hernia or abnormality. No  abdominopelvic ascites.  Musculoskeletal: No acute or significant osseous findings.  IMPRESSION:  1. Mild cholelithiasis. 2. Hepatic steatosis. 3. Moderate splenomegaly. 4. No hydronephrosis or renal obstruction is noted. 5. No renal or ureteral calculi are noted. 6. Mild prostatic enlargement. 7. 4 mm nodule seen posteriorly in right lower lobe. No follow-up needed if patient is low-risk. Non-contrast chest CT can be considered in 12 months if patient is high-risk. This recommendation follows the consensus statement: Guidelines for Management of Incidental Pulmonary Nodules Detected on CT Images: From the Fleischner Society 2017; Radiology 2017; 284:228-243.   Electronically Signed By: Lupita Raider M.D. On: 12/21/2019 18:52   Assessment & Plan:    1. Enlarged prostate -continue rapaflo 8mg  daily - Urinalysis, Routine w reflex microscopic - BLADDER SCAN AMB NON-IMAGING  2. Weak urinary stream -continue rapaflo 8mg  qhs - Urinalysis, Routine w reflex microscopic - BLADDER SCAN AMB NON-IMAGING  3. Benign prostatic hyperplasia with urinary obstruction -rapaflo 8mg  qhs   No follow-ups on file.  , MD  [facility]

## 2021-08-29 NOTE — Progress Notes (Signed)
post void residual=51   Urological Symptom Review  Patient is experiencing the following symptoms: Frequent urination   Review of Systems  Gastrointestinal (upper)  : Negative for upper GI symptoms  Gastrointestinal (lower) : Negative for lower GI symptoms  Constitutional : Negative for symptoms  Skin: Negative for skin symptoms  Eyes: Negative for eye symptoms  Ear/Nose/Throat : Negative for Ear/Nose/Throat symptoms  Hematologic/Lymphatic: Negative for Hematologic/Lymphatic symptoms  Cardiovascular : Negative for cardiovascular symptoms  Respiratory : Negative for respiratory symptoms  Endocrine: Negative for endocrine symptoms  Musculoskeletal: Negative for musculoskeletal symptoms  Neurological: Negative for neurological symptoms  Psychologic: Negative for psychiatric symptoms

## 2021-09-13 ENCOUNTER — Ambulatory Visit (INDEPENDENT_AMBULATORY_CARE_PROVIDER_SITE_OTHER): Payer: BC Managed Care – PPO

## 2021-09-13 DIAGNOSIS — I428 Other cardiomyopathies: Secondary | ICD-10-CM | POA: Diagnosis not present

## 2021-09-16 LAB — CUP PACEART REMOTE DEVICE CHECK
Battery Remaining Longevity: 39 mo
Battery Voltage: 2.97 V
Brady Statistic AP VP Percent: 12.04 %
Brady Statistic AP VS Percent: 0 %
Brady Statistic AS VP Percent: 87.67 %
Brady Statistic AS VS Percent: 0.29 %
Brady Statistic RA Percent Paced: 12 %
Brady Statistic RV Percent Paced: 99.67 %
Date Time Interrogation Session: 20221230150419
Implantable Lead Implant Date: 20161228
Implantable Lead Implant Date: 20161228
Implantable Lead Location: 753859
Implantable Lead Location: 753860
Implantable Lead Model: 5076
Implantable Lead Model: 5076
Implantable Pulse Generator Implant Date: 20161228
Lead Channel Impedance Value: 323 Ohm
Lead Channel Impedance Value: 361 Ohm
Lead Channel Impedance Value: 399 Ohm
Lead Channel Impedance Value: 456 Ohm
Lead Channel Pacing Threshold Amplitude: 0.75 V
Lead Channel Pacing Threshold Amplitude: 0.875 V
Lead Channel Pacing Threshold Pulse Width: 0.4 ms
Lead Channel Pacing Threshold Pulse Width: 0.4 ms
Lead Channel Sensing Intrinsic Amplitude: 0.875 mV
Lead Channel Sensing Intrinsic Amplitude: 0.875 mV
Lead Channel Sensing Intrinsic Amplitude: 16.625 mV
Lead Channel Sensing Intrinsic Amplitude: 16.625 mV
Lead Channel Setting Pacing Amplitude: 2 V
Lead Channel Setting Pacing Amplitude: 2.5 V
Lead Channel Setting Pacing Pulse Width: 0.4 ms
Lead Channel Setting Sensing Sensitivity: 2.8 mV

## 2021-09-25 NOTE — Progress Notes (Signed)
Remote pacemaker transmission.   

## 2021-10-03 ENCOUNTER — Other Ambulatory Visit: Payer: Self-pay | Admitting: Neurosurgery

## 2021-10-03 ENCOUNTER — Other Ambulatory Visit (HOSPITAL_COMMUNITY): Payer: Self-pay | Admitting: Neurosurgery

## 2021-10-03 DIAGNOSIS — M5441 Lumbago with sciatica, right side: Secondary | ICD-10-CM

## 2021-11-24 ENCOUNTER — Emergency Department (HOSPITAL_COMMUNITY)
Admission: EM | Admit: 2021-11-24 | Discharge: 2021-11-25 | Disposition: A | Discharge status: Home / Self Care | Payer: BC Managed Care – PPO | Attending: Emergency Medicine | Admitting: Emergency Medicine

## 2021-11-24 ENCOUNTER — Encounter (HOSPITAL_COMMUNITY): Payer: Self-pay

## 2021-11-24 ENCOUNTER — Other Ambulatory Visit: Payer: Self-pay

## 2021-11-24 DIAGNOSIS — S01511A Laceration without foreign body of lip, initial encounter: Secondary | ICD-10-CM | POA: Insufficient documentation

## 2021-11-24 DIAGNOSIS — I110 Hypertensive heart disease with heart failure: Secondary | ICD-10-CM | POA: Diagnosis not present

## 2021-11-24 DIAGNOSIS — Z79899 Other long term (current) drug therapy: Secondary | ICD-10-CM | POA: Diagnosis not present

## 2021-11-24 DIAGNOSIS — I5022 Chronic systolic (congestive) heart failure: Secondary | ICD-10-CM | POA: Insufficient documentation

## 2021-11-24 DIAGNOSIS — Z950 Presence of cardiac pacemaker: Secondary | ICD-10-CM | POA: Diagnosis not present

## 2021-11-24 DIAGNOSIS — Z23 Encounter for immunization: Secondary | ICD-10-CM | POA: Diagnosis not present

## 2021-11-24 DIAGNOSIS — R55 Syncope and collapse: Secondary | ICD-10-CM | POA: Diagnosis not present

## 2021-11-24 DIAGNOSIS — Z955 Presence of coronary angioplasty implant and graft: Secondary | ICD-10-CM | POA: Insufficient documentation

## 2021-11-24 DIAGNOSIS — W01198A Fall on same level from slipping, tripping and stumbling with subsequent striking against other object, initial encounter: Secondary | ICD-10-CM | POA: Insufficient documentation

## 2021-11-24 DIAGNOSIS — J45909 Unspecified asthma, uncomplicated: Secondary | ICD-10-CM | POA: Diagnosis not present

## 2021-11-24 DIAGNOSIS — I4891 Unspecified atrial fibrillation: Secondary | ICD-10-CM | POA: Diagnosis not present

## 2021-11-24 DIAGNOSIS — S0993XA Unspecified injury of face, initial encounter: Secondary | ICD-10-CM | POA: Diagnosis present

## 2021-11-24 DIAGNOSIS — Z7901 Long term (current) use of anticoagulants: Secondary | ICD-10-CM | POA: Insufficient documentation

## 2021-11-24 DIAGNOSIS — I2510 Atherosclerotic heart disease of native coronary artery without angina pectoris: Secondary | ICD-10-CM | POA: Insufficient documentation

## 2021-11-24 NOTE — ED Triage Notes (Signed)
Pt to ED from home with c/o fall, pt says he got up to get water and "blacked out", when falling pt says he remembers hitting chin on the chair, then falling to ground- pt has laceration to upper lip. Pt takes xarelto. ?

## 2021-11-25 ENCOUNTER — Emergency Department (HOSPITAL_COMMUNITY): Payer: BC Managed Care – PPO

## 2021-11-25 LAB — BASIC METABOLIC PANEL
Anion gap: 9 (ref 5–15)
BUN: 16 mg/dL (ref 8–23)
CO2: 27 mmol/L (ref 22–32)
Calcium: 9.2 mg/dL (ref 8.9–10.3)
Chloride: 103 mmol/L (ref 98–111)
Creatinine, Ser: 1.07 mg/dL (ref 0.61–1.24)
GFR, Estimated: >60 mL/min (ref >60)
Glucose, Bld: 101 mg/dL — ABNORMAL HIGH (ref 70–99)
Potassium: 3.8 mmol/L (ref 3.5–5.1)
Sodium: 139 mmol/L (ref 135–145)

## 2021-11-25 LAB — CBC WITH DIFFERENTIAL/PLATELET
Abs Immature Granulocytes: 0.04 10*3/uL (ref 0.00–0.07)
Basophils Absolute: 0.2 10*3/uL — ABNORMAL HIGH (ref 0.0–0.1)
Basophils Relative: 2 %
Eosinophils Absolute: 0.1 10*3/uL (ref 0.0–0.5)
Eosinophils Relative: 1 %
HCT: 48.4 % (ref 39.0–52.0)
Hemoglobin: 16.2 g/dL (ref 13.0–17.0)
Immature Granulocytes: 0 %
Lymphocytes Relative: 24 %
Lymphs Abs: 2.4 10*3/uL (ref 0.7–4.0)
MCH: 30.6 pg (ref 26.0–34.0)
MCHC: 33.5 g/dL (ref 30.0–36.0)
MCV: 91.3 fL (ref 80.0–100.0)
Monocytes Absolute: 0.8 10*3/uL (ref 0.1–1.0)
Monocytes Relative: 8 %
Neutro Abs: 6.4 10*3/uL (ref 1.7–7.7)
Neutrophils Relative %: 65 %
Platelets: 284 10*3/uL (ref 150–400)
RBC: 5.3 MIL/uL (ref 4.22–5.81)
RDW: 14.1 % (ref 11.5–15.5)
WBC: 9.8 10*3/uL (ref 4.0–10.5)
nRBC: 0 % (ref 0.0–0.2)

## 2021-11-25 MED ORDER — TETANUS-DIPHTH-ACELL PERTUSSIS 5-2.5-18.5 LF-MCG/0.5 IM SUSY
0.5000 mL | PREFILLED_SYRINGE | Freq: Once | INTRAMUSCULAR | Status: AC
  Administered 2021-11-25: 0.5 mL via INTRAMUSCULAR
  Filled 2021-11-25: qty 0.5

## 2021-11-25 MED ORDER — LIDOCAINE-EPINEPHRINE (PF) 1 %-1:200000 IJ SOLN
20.0000 mL | Freq: Once | INTRAMUSCULAR | Status: AC
  Administered 2021-11-25: 20 mL
  Filled 2021-11-25: qty 30

## 2021-11-25 NOTE — Discharge Instructions (Signed)
You were seen today for passing out.  Your work-up is reassuring.  Your laceration was repaired with absorbable sutures.  These do not need to be removed.  Make sure that you are staying hydrated.  Follow-up with your primary doctor and cardiologist. ?

## 2021-11-25 NOTE — ED Provider Notes (Signed)
[facility] EMERGENCY DEPARTMENT Provider Note   CSN: [id number] Arrival date & time: 11/24/21  2344     History  Chief Complaint  Patient presents with   Fall    Lip laceration     [name] [name] is a 66 y.o. male.  HPI     This is a 66 year old male who presents after an episode of syncope and fall.  Patient reports that he got up quickly from a chair and walked down several steps.  By the time he got up on the steps he had a sense that he was going to blackout.  Patient states "I just blacked out and "I remember hitting my chin on something."  He noted a laceration to the upper lip.  Denies chest pain or shortness of breath.  Denies headache or neck pain.  He has a pacemaker.  Currently takes Xarelto.  Home Medications Prior to Admission medications   Medication Sig Start Date End Date Taking? Authorizing Provider  allopurinol (ZYLOPRIM) 300 MG tablet Take 300 mg by mouth every evening.    [provider]  COLCRYS 0.6 MG tablet Take 0.6 mg by mouth daily as needed (Gout).  06/12/15   [provider]  loratadine (CLARITIN) 10 MG tablet Take 10 mg by mouth daily.    [provider]  metoprolol succinate (TOPROL-XL) 25 MG 24 hr tablet TAKE 1 TABLET BY MOUTH EVERY DAY. MAY TAKE 1/2 TO 1 TABLET EXTRA FOR PALITATTIONS 12/28/19   Evans Lance, MD  nitroGLYCERIN (NITROLINGUAL) 0.4 MG/SPRAY spray USE 1 SPRAY UNDER THE TONGUE EVERY 5 MINUTES AS NEEDED FOR CHEST PAIN 02/11/20   Evans Lance, MD  omeprazole (PRILOSEC) 20 MG capsule Take 20 mg by mouth daily.    [provider]  QUEtiapine (SEROQUEL) 300 MG tablet Take 300 mg by mouth at bedtime.    [provider]  rivaroxaban (XARELTO) 20 MG TABS tablet Take 1 tablet (20 mg total) by mouth daily with supper. 11/23/20   Lois Huxley, PA  rosuvastatin (CRESTOR) 10 MG tablet TAKE 1 TABLET BY MOUTH EVERY DAY 12/04/20   Evans Lance, MD  silodosin (RAPAFLO) 8 MG CAPS capsule Take 1  capsule (8 mg total) by mouth at bedtime. 08/29/21   McKenzie, Candee Furbish, MD  tamsulosin (FLOMAX) 0.4 MG CAPS capsule Take 0.4 mg by mouth daily.  08/11/18   [provider]  temazepam (RESTORIL) 30 MG capsule Take 30 mg by mouth at bedtime.  02/20/16   [provider]  valsartan (DIOVAN) 320 MG tablet Take 1 tablet by mouth daily.    [provider]      Allergies    Sulfonamide derivatives and Zolpidem tartrate    Review of Systems   Review of Systems  Constitutional:  Negative for fever.  Respiratory:  Negative for shortness of breath.   Cardiovascular:  Negative for chest pain.  Gastrointestinal:  Negative for abdominal pain.  Neurological:  Positive for syncope.  All other systems reviewed and are negative.  Physical Exam Updated Vital Signs BP (!) 142/94    Pulse 77    Temp 97.9 F (36.6 C) (Axillary)    Resp 14    Ht 1.727 m (5\' 8" )    Wt 105.7 kg    SpO2 93%    BMI 35.43 kg/m  Physical Exam Vitals and nursing note reviewed.  Constitutional:      Appearance: He is well-developed. He is obese. He is not ill-appearing.  HENT:     Head: Normocephalic.     Comments: Laceration left upper lip, actively bleeding, 1 cm, crosses milium border Mucosa of the upper lip with a 2 cm laceration     Nose: Nose normal.     Mouth/Throat:     Mouth: Mucous membranes are moist.  Eyes:     Pupils: Pupils are equal, round, and reactive to light.  Cardiovascular:     Rate and Rhythm: Normal rate and regular rhythm.     Heart sounds: Normal heart sounds. No murmur heard. Pulmonary:     Effort: Pulmonary effort is normal. No respiratory distress.     Breath sounds: Normal breath sounds. No wheezing.  Abdominal:     Palpations: Abdomen is soft.     Tenderness: There is no abdominal tenderness. There is no rebound.  Musculoskeletal:     Cervical back: Normal range of motion and neck supple. No tenderness.     Right lower leg: No edema.     Left lower leg: No  edema.  Lymphadenopathy:     Cervical: No cervical adenopathy.  Skin:    General: Skin is warm and dry.  Neurological:     Mental Status: He is alert and oriented to person, place, and time.  Psychiatric:        Mood and Affect: Mood normal.    ED Results / Procedures / Treatments   Labs (all labs ordered are listed, but only abnormal results are displayed) Labs Reviewed  CBC WITH DIFFERENTIAL/PLATELET - Abnormal; Notable for the following components:      Result Value   Basophils Absolute 0.2 (*)    All other components within normal limits  BASIC METABOLIC PANEL - Abnormal; Notable for the following components:   Glucose, Bld 101 (*)    All other components within normal limits    EKG EKG Interpretation  Date/Time:  Sunday November 25 2021 00:03:34 EST Ventricular Rate:  81 PR Interval:  246 QRS Duration: 168 QT Interval:  458 QTC Calculation: 532 R Axis:   115 Text Interpretation: Sinus rhythm Prolonged PR interval Nonspecific intraventricular conduction delay Lateral infarct, age indeterminate Probable anterior infarct, recent Baseline wander in lead(s) V2 V3 No longer paced Confirmed by Thayer Jew [phone]) on 11/25/2021 12:11:29 AM  Radiology CT Head Wo Contrast  Result Date: 11/25/2021 CLINICAL DATA:  Closed head trauma on blood thinners. EXAM: CT HEAD WITHOUT CONTRAST TECHNIQUE: Contiguous axial images were obtained from the base of the skull through the vertex without intravenous contrast. RADIATION DOSE REDUCTION: This exam was performed according to the departmental dose-optimization program which includes automated exposure control, adjustment of the mA and/or kV according to patient size and/or use of iterative reconstruction technique. COMPARISON:  MRI brain 12/16/2013 FINDINGS: Brain: There is mild cerebral atrophy, small vessel disease and atrophic ventriculomegaly, without midline shift. The cerebellum and brainstem are unremarkable. No asymmetry is seen  concerning for an acute infarct, hemorrhage or mass. The basal cisterns are clear. Vascular: No hyperdense vessel or unexpected calcification. Skull: The calvarium, orbits and skull base are intact without visible focal lesions. There is no visible scalp hematoma. Sinuses/Orbits: Unremarkable orbital contents. There is mild membrane thickening in the ethmoid air cells. Other visible sinuses, bilateral mastoid air cells are clear with pneumatization incidentally noted bilaterally in the petrous apices. Other: None. IMPRESSION: No acute intracranial CT findings, depressed skull fractures, or interval changes from the prior MRI. Electronically Signed   By: Telford Nab M.D.   On:  11/25/2021 00:19    Procedures .Marland KitchenLaceration Repair  Date/Time: 11/25/2021 12:59 AM Performed by: Merryl Hacker, MD Authorized by: Merryl Hacker, MD   Consent:    Consent obtained:  Verbal   Consent given by:  Patient   Risks discussed:  Pain, poor cosmetic result and poor wound healing Anesthesia:    Anesthesia method:  Local infiltration   Local anesthetic:  Lidocaine 1% WITH epi Laceration details:    Location:  Lip   Lip location:  Upper exterior lip   Length (cm):  1   Depth (mm):  5 Pre-procedure details:    Preparation:  Patient was prepped and draped in usual sterile fashion Exploration:    Limited defect created (wound extended): no     Hemostasis achieved with:  Epinephrine and direct pressure   Contaminated: no   Treatment:    Area cleansed with:  Chlorhexidine   Amount of cleaning:  Standard   Irrigation solution:  Sterile saline   Visualized foreign bodies/material removed: no     Debridement:  None   Undermining:  None   Scar revision: no   Skin repair:    Repair method:  Sutures   Suture size:  4-0   Wound skin closure material used: Rapid Vicryl.   Suture technique:  Simple interrupted   Number of sutures:  2 Approximation:    Approximation:  Close   Vermilion border  well-aligned: yes   Repair type:    Repair type:  Simple Post-procedure details:    Dressing:  Open (no dressing)   Procedure completion:  Tolerated .Marland KitchenLaceration Repair  Date/Time: 11/25/2021 1:00 AM Performed by: Merryl Hacker, MD Authorized by: Merryl Hacker, MD   Consent:    Consent obtained:  Verbal   Consent given by:  Patient   Risks, benefits, and alternatives were discussed: yes     Risks discussed:  Infection Universal protocol:    Patient identity confirmed:  Verbally with patient Anesthesia:    Anesthesia method:  Local infiltration   Local anesthetic:  Lidocaine 1% WITH epi Laceration details:    Location:  Lip   Lip location:  Upper interior lip   Length (cm):  2   Depth (mm):  10 Exploration:    Hemostasis achieved with:  Epinephrine and direct pressure Treatment:    Area cleansed with:  Chlorhexidine   Amount of cleaning:  Standard   Irrigation solution:  Sterile saline   Visualized foreign bodies/material removed: no     Debridement:  None   Undermining:  None   Scar revision: no   Skin repair:    Repair method:  Sutures   Suture size:  4-0   Wound skin closure material used: Rapid Vicryl.   Suture technique:  Simple interrupted   Number of sutures:  2 Approximation:    Approximation:  Close Repair type:    Repair type:  Simple Post-procedure details:    Dressing:  Open (no dressing)   Procedure completion:  Tolerated    Medications Ordered in ED Medications  lidocaine-EPINEPHrine (PF) (XYLOCAINE-EPINEPHrine) 1 %-1:200000 (PF) injection 20 mL (20 mLs Infiltration Given 11/25/21 0100)  Tdap (BOOSTRIX) injection 0.5 mL (0.5 mLs Intramuscular Given 11/25/21 0059)    ED Course/ Medical Decision Making/ A&P Clinical Course as of 11/25/21 0157  Sun Nov 25, 2021  0136 Laceration repaired.  Per [employer] report, patient is occasionally under sensing.  No arrhythmias since this evening.  Last atrial tachycardia yesterday morning. [CH]  Clinical Course User Index [CH] Mkayla Steele, Barbette Hair, MD                           Medical Decision Making Amount and/or Complexity of Data Reviewed Labs: ordered. Radiology: ordered.  Risk Prescription drug management.   This patient presents to the ED for concern of syncope, fall, this involves an extensive number of treatment options, and is a complaint that carries with it a high risk of complications and morbidity.  The differential diagnosis includes arrhythmia, vasovagal syncope, orthostasis  MDM:    This is a 66 year old male blood thinners who presents following a fall.  He has not had a laceration to the lip.  He is nontoxic otherwise and vital signs are reassuring.  He reports that he passed out after standing quickly.  He does have a pacemaker.  Pacemaker was interrogated and did not show any obvious arrhythmic event.  Lacerations were repaired at the bedside.  CT head does not show any evidence of acute traumatic bleed.  Basic lab work-up was reassuring and no significant metabolic derangements.  Suspect he may have had a vasovagal episode versus orthostasis given absence of arrhythmia on review of his pacemaker.  No other obvious notable injury.  We will plan for discharge home and follow-up with cardiology. (Labs, imaging)  Labs: I Ordered, and personally interpreted labs.  The pertinent results include: Normal CBC, reassuring BMP  Imaging Studies ordered: I ordered imaging studies including CT head negative for acute bleed I independently visualized and interpreted imaging. I agree with the radiologist interpretation  Additional history obtained from wife at bedside.  External records from outside source obtained and reviewed including prior visit  Critical Interventions: Laceration repair  Consultations: I requested consultation with the NA,  and discussed lab and imaging findings as well as pertinent plan - they recommend: N/A  Cardiac Monitoring: The patient  was maintained on a cardiac monitor.  I personally viewed and interpreted the cardiac monitored which showed an underlying rhythm of: Normal sinus rhythm  Reevaluation: After the interventions noted above, I reevaluated the patient and found that they have :improved   Considered admission for: Syncope  Social Determinants of Health: Lives independently  Disposition: Discharge  Co morbidities that complicate the patient evaluation  Past Medical History:  Diagnosis Date   ALLERGIC RHINITIS 05/14/2010   Qualifier: Diagnosis of  By: Claybon Jabs PA, Dawn     Asthma    Asthma    Atrial fibrillation [facility])    new November 2016   Bipolar 1 disorder ([place])    "put me on this after I got out of drug rehab 11/18/2005 and couldn't sleep; added anxiety RX and have been sleeping fine since"   Bradycardia    Very limited Wenkebach on event recorder November 2010   CAD (coronary artery disease) 2006   3v CAD >> s/p CABG in [place], patent LIMA to LAD and SVG to diagonal with 40% stenosis 2010 // Nuc 2/14: low risk, EF 56 // LHC 3/16: patent L-LAD, patent S-Dx // Echo 3/16: mild conc LVH, EF 60-65, Gr 1 DD, trivial TR, mild LAE // LHC 12/19: oLAD 100, pLCx 20, S-D1 patent, L-LAD patent, EF 45   Cardiac pacemaker in situ 08/18/2018   CHB (complete heart block) ([place]) 12/02/2014   Chest pain with moderate risk of acute coronary syndrome    Chronic systolic CHF (congestive heart failure) ([place]) 09/15/2018   EF 45 by LHC in  12/19 // Echo 09/2018: inf HK, EF 45-50, mod LAE   Dyslipidemia    mixed    ED (erectile dysfunction)    Ejection fraction    EF 50%, catheterization, September, 2010   //   EF 60-65%, echo, December 03, 2014    Essential hypertension    GERD (gastroesophageal reflux disease)    Gout    History of stomach ulcers 1980's   Hx of CABG    2006, [place] loss-SVD on MRI March 2015 11/18/2013   Mixed hyperlipidemia    pt states "they took me off q med they had me on for high  cholesterol; my good cholesterol is low" (09/13/2015)   Myocardial infarction ([place]) 2006   OSA (obstructive sleep apnea)    patient denies, stated had a sleep study and he was told he did not have     PAF (paroxysmal atrial fibrillation) ([place]) 08/18/2018   Palpitations    Event recorder November, 2010, no significant arrhythmias    Pneumonia 2015   Pneumonia 1990's X 4 in one year   "double pneumonia"   Presence of permanent cardiac pacemaker    Second degree AV block 09/13/2015   Second degree AV block, Mobitz type I 03/10/2013   Patient had Mobitz 1 heart block that was asymptomatic. This resolved off beta blockade while in the hospital March, 2016. Plan to keep the patient off beta blockers. Patient will be seen in follow-up by Dr. Lovena Le and the [place] office.    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([place])      Medicines Meds ordered this encounter  Medications   lidocaine-EPINEPHrine (PF) (XYLOCAINE-EPINEPHrine) 1 %-1:200000 (PF) injection 20 mL   Tdap (BOOSTRIX) injection 0.5 mL    I have reviewed the patients home medicines and have made adjustments as needed  Problem List / ED Course: Problem List Items Addressed This Visit   None Visit Diagnoses     Vasovagal syncope    -  Primary   Lip laceration, initial encounter                       Final Clinical Impression(s) / ED Diagnoses Final diagnoses:  Vasovagal syncope  Lip laceration, initial encounter    Rx / DC Orders ED Discharge Orders     None         Dina Rich, Barbette Hair, MD 11/25/21 0159

## 2021-11-28 ENCOUNTER — Other Ambulatory Visit: Payer: Self-pay

## 2021-11-28 ENCOUNTER — Ambulatory Visit (HOSPITAL_COMMUNITY)
Admission: RE | Admit: 2021-11-28 | Discharge: 2021-11-28 | Disposition: A | Discharge status: Home / Self Care | Payer: No Typology Code available for payment source | Source: Ambulatory Visit | Attending: Neurosurgery | Admitting: Neurosurgery

## 2021-11-28 DIAGNOSIS — M5441 Lumbago with sciatica, right side: Secondary | ICD-10-CM | POA: Insufficient documentation

## 2021-11-28 MED ORDER — GADOBUTROL 1 MMOL/ML IV SOLN
10.0000 mL | Freq: Once | INTRAVENOUS | Status: AC | PRN
  Administered 2021-11-28: 10 mL via INTRAVENOUS

## 2021-11-28 NOTE — Progress Notes (Signed)
Will change device settings for MRI to  ?DOO at 85 bpm and will ?Program device back to pre-MRI settings after completion of exam, and send transmission. ?

## 2021-12-13 ENCOUNTER — Ambulatory Visit (INDEPENDENT_AMBULATORY_CARE_PROVIDER_SITE_OTHER): Payer: BC Managed Care – PPO

## 2021-12-13 DIAGNOSIS — I428 Other cardiomyopathies: Secondary | ICD-10-CM | POA: Diagnosis not present

## 2021-12-14 LAB — CUP PACEART REMOTE DEVICE CHECK
Battery Remaining Longevity: 35 mo
Battery Voltage: 2.96 V
Brady Statistic AP VP Percent: 4.58 %
Brady Statistic AP VS Percent: 0.01 %
Brady Statistic AS VP Percent: 95.08 %
Brady Statistic AS VS Percent: 0.33 %
Brady Statistic RA Percent Paced: 4.58 %
Brady Statistic RV Percent Paced: 99.5 %
Date Time Interrogation Session: 20230331132653
Implantable Lead Implant Date: 20161228
Implantable Lead Implant Date: 20161228
Implantable Lead Location: 753859
Implantable Lead Location: 753860
Implantable Lead Model: 5076
Implantable Lead Model: 5076
Implantable Pulse Generator Implant Date: 20161228
Lead Channel Impedance Value: 342 Ohm
Lead Channel Impedance Value: 380 Ohm
Lead Channel Impedance Value: 380 Ohm
Lead Channel Impedance Value: 437 Ohm
Lead Channel Pacing Threshold Amplitude: 0.75 V
Lead Channel Pacing Threshold Amplitude: 0.875 V
Lead Channel Pacing Threshold Pulse Width: 0.4 ms
Lead Channel Pacing Threshold Pulse Width: 0.4 ms
Lead Channel Sensing Intrinsic Amplitude: 1 mV
Lead Channel Sensing Intrinsic Amplitude: 1 mV
Lead Channel Sensing Intrinsic Amplitude: 12.375 mV
Lead Channel Sensing Intrinsic Amplitude: 12.375 mV
Lead Channel Setting Pacing Amplitude: 2 V
Lead Channel Setting Pacing Amplitude: 2.5 V
Lead Channel Setting Pacing Pulse Width: 0.4 ms
Lead Channel Setting Sensing Sensitivity: 2.8 mV

## 2021-12-18 ENCOUNTER — Ambulatory Visit: Payer: BC Managed Care – PPO | Admitting: Internal Medicine

## 2021-12-18 ENCOUNTER — Encounter: Payer: Self-pay | Admitting: Internal Medicine

## 2021-12-18 VITALS — BP 124/72 | HR 79 | Ht 68.0 in | Wt 238.6 lb

## 2021-12-18 DIAGNOSIS — I480 Paroxysmal atrial fibrillation: Secondary | ICD-10-CM

## 2021-12-18 DIAGNOSIS — I442 Atrioventricular block, complete: Secondary | ICD-10-CM

## 2021-12-18 NOTE — Patient Instructions (Signed)
Medication Instructions:  Your physician recommends that you continue on your current medications as directed. Please refer to the Current Medication list given to you today.  *If you need a refill on your cardiac medications before your next appointment, please call your pharmacy*   Lab Work: NONE   If you have labs (blood work) drawn today and your tests are completely normal, you will receive your results only by: . MyChart Message (if you have MyChart) OR . A paper copy in the mail If you have any lab test that is abnormal or we need to change your treatment, we will call you to review the results.   Testing/Procedures: NONE    Follow-Up: At [facility], you and your health needs are our priority.  As part of our continuing mission to provide you with exceptional heart care, we have created designated Provider Care Teams.  These Care Teams include your primary Cardiologist (physician) and Advanced Practice Providers (APPs -  Physician Assistants and Nurse Practitioners) who all work together to provide you with the care you need, when you need it.  We recommend signing up for the patient portal called "MyChart".  Sign up information is provided on this After Visit Summary.  MyChart is used to connect with patients for Virtual Visits (Telemedicine).  Patients are able to view lab/test results, encounter notes, upcoming appointments, etc.  Non-urgent messages can be sent to your provider as well.   To learn more about what you can do with MyChart, go to [email or web address].    Your next appointment:   1 year(s)  The format for your next appointment:   In Person  Provider:   Gregg Taylor, MD   Other Instructions Thank you for choosing [facility]!    

## 2021-12-18 NOTE — Progress Notes (Signed)
? ? ? ? ?HPI ?[name] [name] returns today for follwoup. He is a pleasant 66 yo man with a h/o CAD, s/p CABG, HTN, and gout. He has sinus node dysfunction as well as heart block, s/p PPM insertion. He has done well in the interim except he has retired and gained weight. He notes episodes of non-exertional chest pain, which is different from what he experienced prior to CABG.  ?Allergies  ?Allergen Reactions  ? Sulfonamide Derivatives Hives  ? Zolpidem Tartrate Other (See Comments)  ? ? ? ?Current Outpatient Medications  ?Medication Sig Dispense Refill  ? allopurinol (ZYLOPRIM) 300 MG tablet Take 300 mg by mouth every evening.    ? COLCRYS 0.6 MG tablet Take 0.6 mg by mouth daily as needed (Gout).   0  ? loratadine (CLARITIN) 10 MG tablet Take 10 mg by mouth daily.    ? metoprolol succinate (TOPROL-XL) 25 MG 24 hr tablet TAKE 1 TABLET BY MOUTH EVERY DAY. MAY TAKE 1/2 TO 1 TABLET EXTRA FOR PALITATTIONS 60 tablet 11  ? nitroGLYCERIN (NITROLINGUAL) 0.4 MG/SPRAY spray USE 1 SPRAY UNDER THE TONGUE EVERY 5 MINUTES AS NEEDED FOR CHEST PAIN 12 g 3  ? omeprazole (PRILOSEC) 20 MG capsule Take 20 mg by mouth daily.    ? QUEtiapine (SEROQUEL) 300 MG tablet Take 300 mg by mouth at bedtime.    ? rivaroxaban (XARELTO) 20 MG TABS tablet Take 1 tablet (20 mg total) by mouth daily with supper. 30 tablet   ? rosuvastatin (CRESTOR) 10 MG tablet TAKE 1 TABLET BY MOUTH EVERY DAY 30 tablet 0  ? silodosin (RAPAFLO) 8 MG CAPS capsule Take 1 capsule (8 mg total) by mouth at bedtime. 90 capsule 3  ? tamsulosin (FLOMAX) 0.4 MG CAPS capsule Take 0.4 mg by mouth daily.   11  ? temazepam (RESTORIL) 30 MG capsule Take 30 mg by mouth at bedtime.   5  ? valsartan (DIOVAN) 320 MG tablet Take 1 tablet by mouth daily.    ? ?No current facility-administered medications for this visit.  ? ? ? ?Past Medical History:  ?Diagnosis Date  ? ALLERGIC RHINITIS 05/14/2010  ? Qualifier: Diagnosis of  By: Claybon Jabs PA, [place]    ? Asthma   ? Asthma   ? Atrial fibrillation  ([place])   ? new November 2016  ? Bipolar 1 disorder ([place])   ? "put me on this after I got out of drug rehab 11/18/2005 and couldn't sleep; added anxiety RX and have been sleeping fine since"  ? Bradycardia   ? Very limited Wenkebach on event recorder November 2010  ? CAD (coronary artery disease) 2006  ? 3v CAD >> s/p CABG in [place], patent LIMA to LAD and SVG to diagonal with 40% stenosis 2010 // Nuc 2/14: low risk, EF 56 // LHC 3/16: patent L-LAD, patent S-Dx // Echo 3/16: mild conc LVH, EF 60-65, Gr 1 DD, trivial TR, mild LAE // LHC 12/19: oLAD 100, pLCx 20, S-D1 patent, L-LAD patent, EF 45  ? Cardiac pacemaker in situ 08/18/2018  ? CHB (complete heart block) ([place]) 12/02/2014  ? Chest pain with moderate risk of acute coronary syndrome   ? Chronic systolic CHF (congestive heart failure) ([place]) 09/15/2018  ? EF 45 by LHC in 12/19 // Echo 09/2018: inf HK, EF 45-50, mod LAE  ? Dyslipidemia   ? mixed   ? ED (erectile dysfunction)   ? Ejection fraction   ? EF 50%, catheterization, September, 2010   //  EF 60-65%, echo, December 03, 2014   ? Essential hypertension   ? GERD (gastroesophageal reflux disease)   ? Gout   ? History of stomach ulcers 1980's  ? Hx of CABG   ? 2006, [place]   ? Memory loss-SVD on MRI March 2015 11/18/2013  ? Mixed hyperlipidemia   ? pt states "they took me off q med they had me on for high cholesterol; my good cholesterol is low" (09/13/2015)  ? Myocardial infarction [facility]) 2006  ? OSA (obstructive sleep apnea)   ? patient denies, stated had a sleep study and he was told he did not have    ? PAF (paroxysmal atrial fibrillation) ([place]) 08/18/2018  ? Palpitations   ? Event recorder November, 2010, no significant arrhythmias   ? Pneumonia 2015  ? Pneumonia 1990's X 4 in one year  ? "double pneumonia"  ? Presence of permanent cardiac pacemaker   ? Second degree AV block 09/13/2015  ? Second degree AV block, Mobitz type I 03/10/2013  ? Patient had Mobitz 1 heart block that was asymptomatic. This resolved off  beta blockade while in the hospital March, 2016. Plan to keep the patient off beta blockers. Patient will be seen in follow-up by Dr. Lovena Le and the [place] office.   ? 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([place])   ? ? ?ROS: ? ? All systems reviewed and negative except as noted in the HPI. ? ? ?Past Surgical History:  ?Procedure Laterality Date  ? BACK SURGERY    ? CARDIAC CATHETERIZATION    ? "I've had quite a few; don't remember any stents" (09/13/2015)  ? CORONARY ANGIOPLASTY    ? CORONARY ARTERY BYPASS GRAFT  2006  ? "[place]AB[place] X2, in [place]"  ? EP IMPLANTABLE DEVICE N/A 09/13/2015  ? Procedure: Pacemaker Implant;  Surgeon: Thompson Grayer, MD;  Location: [place] [facility];  Service: Cardiovascular;  Laterality: N/A;  ? INSERT / REPLACE / REMOVE PACEMAKER  09/13/2015  ? Medtronic  ? LEFT HEART CATH AND CORS/GRAFTS ANGIOGRAPHY N/A 08/26/2018  ? Procedure: LEFT HEART CATH AND CORS/GRAFTS ANGIOGRAPHY;  Surgeon: Troy Sine, MD;  Location: [place] [facility];  Service: Cardiovascular;  Laterality: N/A;  ? LEFT HEART CATHETERIZATION WITH CORONARY/GRAFT ANGIOGRAM N/A 12/05/2014  ? Procedure: LEFT HEART CATHETERIZATION WITH Beatrix Fetters;  Surgeon: Peter M Martinique, MD;  Location: [facility] CATH LAB;  Service: Cardiovascular;  Laterality: N/A;  ? LEG SURGERY Left 1961  ? "had 5 boils come up on my leg; they had to cut them out and put a drainage tube in there"  ? LUMBAR DISC SURGERY  X 3  ? "ruptured disc"  ? MAXIMUM ACCESS (MAS)POSTERIOR LUMBAR INTERBODY FUSION (PLIF) 1 LEVEL  ~ 2003  ? "L4-5; put rods & screws in"  ? ? ? ?Family History  ?Problem Relation Age of Onset  ? COPD Mother   ? Lung cancer Mother   ? Stroke Mother   ? Heart attack Mother   ? Heart disease Father   ? Heart attack Father   ? Hyperlipidemia Brother   ? Hypertension Brother   ? Hypertension Brother   ? Hypertension Sister   ? ? ? ?Social History  ? ?Socioeconomic History  ? Marital status: Married  ?  Spouse name: Not on file  ? Number of  children: Not on file  ? Years of education: Not on file  ? Highest education level: Not on file  ?Occupational History  ? Not on file  ?Tobacco Use  ?  Smoking status: Former  ?  Packs/day: 2.00  ?  Years: 17.00  ?  Pack years: 34.00  ?  Types: Cigarettes  ?  Quit date: 03/13/2017  ?  Years since quitting: 4.7  ? Smokeless tobacco: Current  ?  Types: Snuff  ? Tobacco comments:  ?  09/13/2015 "quit chewing tobacco August 2016"  ?Vaping Use  ? Vaping Use: Never used  ?Substance and Sexual Activity  ? Alcohol use: Not Currently  ?  Comment: 09/13/2015 "got out of alcohol rehab 11/18/2005"  ? Drug use: Not Currently  ?  Comment: 09/13/2015 "got out of rehab for pills 11/18/2005"  ? Sexual activity: Yes  ?  Birth control/protection: None  ?Other Topics Concern  ? Not on file  ?Social History Narrative  ? 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   ? ?Social Determinants of Health  ? ?Financial Resource Strain: Not on file  ?Food Insecurity: Not on file  ?Transportation Needs: Not on file  ?Physical Activity: Not on file  ?Stress: Not on file  ?Social Connections: Not on file  ?Intimate Partner Violence: Not on file  ? ? ? ?BP 124/72   Pulse 79   Ht 5\' 8"  (1.727 m)   Wt 238 lb 9.6 oz (108.2 kg)   SpO2 96%   BMI 36.28 kg/m?  ? ?Physical Exam: ? ?Well appearing NAD ?HEENT: Unremarkable ?Neck:  No JVD, no thyromegally ?Lymphatics:  No adenopathy ?Back:  No CVA tenderness ?Lungs:  Clear with no wheezes ?HEART:  Regular rate rhythm, no murmurs, no rubs, no clicks ?Abd:  soft, positive bowel sounds, no organomegally, no rebound, no guarding ?Ext:  2 plus pulses, no edema, no cyanosis, no clubbing ?Skin:  No rashes no nodules ?Neuro:  CN II through XII intact, motor grossly intact ? ?DEVICE  ?Normal device function.  See PaceArt for details.  ? ?Assess/Plan:  ?1. Heart block - he is asymptomatic s/p PPM insertion. ?2. PPM - his medtronic DDD PM is working normally. We will recheck in several months. ?3. Chest  pain - his symptoms are non-exertional and I encouraged him to call if they change and become so. ?4. Obesity - he states that he has gained 50 lbs since he retired. I encouraged him to lose weight. ?  ?[name] [name]

## 2021-12-25 NOTE — Progress Notes (Signed)
Remote pacemaker transmission.   

## 2022-02-25 ENCOUNTER — Ambulatory Visit: Payer: BC Managed Care – PPO | Admitting: Urology

## 2022-03-04 ENCOUNTER — Ambulatory Visit: Payer: BC Managed Care – PPO | Admitting: Urology

## 2022-03-04 DIAGNOSIS — N400 Enlarged prostate without lower urinary tract symptoms: Secondary | ICD-10-CM

## 2022-03-04 DIAGNOSIS — N401 Enlarged prostate with lower urinary tract symptoms: Secondary | ICD-10-CM

## 2022-03-14 ENCOUNTER — Ambulatory Visit (INDEPENDENT_AMBULATORY_CARE_PROVIDER_SITE_OTHER): Payer: BC Managed Care – PPO

## 2022-03-14 DIAGNOSIS — I428 Other cardiomyopathies: Secondary | ICD-10-CM

## 2022-03-17 LAB — CUP PACEART REMOTE DEVICE CHECK
Battery Remaining Longevity: 35 mo
Battery Voltage: 2.95 V
Brady Statistic AP VP Percent: 12.55 %
Brady Statistic AP VS Percent: 0.03 %
Brady Statistic AS VP Percent: 86.92 %
Brady Statistic AS VS Percent: 0.49 %
Brady Statistic RA Percent Paced: 12.45 %
Brady Statistic RV Percent Paced: 99.27 %
Date Time Interrogation Session: 20230630145241
Implantable Lead Implant Date: 20161228
Implantable Lead Implant Date: 20161228
Implantable Lead Location: 753859
Implantable Lead Location: 753860
Implantable Lead Model: 5076
Implantable Lead Model: 5076
Implantable Pulse Generator Implant Date: 20161228
Lead Channel Impedance Value: 323 Ohm
Lead Channel Impedance Value: 361 Ohm
Lead Channel Impedance Value: 380 Ohm
Lead Channel Impedance Value: 418 Ohm
Lead Channel Pacing Threshold Amplitude: 0.75 V
Lead Channel Pacing Threshold Amplitude: 0.875 V
Lead Channel Pacing Threshold Pulse Width: 0.4 ms
Lead Channel Pacing Threshold Pulse Width: 0.4 ms
Lead Channel Sensing Intrinsic Amplitude: 1.25 mV
Lead Channel Sensing Intrinsic Amplitude: 1.25 mV
Lead Channel Sensing Intrinsic Amplitude: 15 mV
Lead Channel Sensing Intrinsic Amplitude: 15 mV
Lead Channel Setting Pacing Amplitude: 2 V
Lead Channel Setting Pacing Amplitude: 2.5 V
Lead Channel Setting Pacing Pulse Width: 0.4 ms
Lead Channel Setting Sensing Sensitivity: 2.8 mV

## 2022-03-28 NOTE — Progress Notes (Signed)
Remote pacemaker transmission.   

## 2022-06-13 ENCOUNTER — Ambulatory Visit (INDEPENDENT_AMBULATORY_CARE_PROVIDER_SITE_OTHER): Payer: Medicare Other

## 2022-06-13 DIAGNOSIS — I428 Other cardiomyopathies: Secondary | ICD-10-CM

## 2022-06-14 LAB — CUP PACEART REMOTE DEVICE CHECK
Battery Remaining Longevity: 34 mo
Battery Voltage: 2.95 V
Brady Statistic AP VP Percent: 15.96 %
Brady Statistic AP VS Percent: 0.03 %
Brady Statistic AS VP Percent: 83.67 %
Brady Statistic AS VS Percent: 0.34 %
Brady Statistic RA Percent Paced: 15.77 %
Brady Statistic RV Percent Paced: 99.47 %
Date Time Interrogation Session: 20230928195644
Implantable Lead Implant Date: 20161228
Implantable Lead Implant Date: 20161228
Implantable Lead Location: 753859
Implantable Lead Location: 753860
Implantable Lead Model: 5076
Implantable Lead Model: 5076
Implantable Pulse Generator Implant Date: 20161228
Lead Channel Impedance Value: 342 Ohm
Lead Channel Impedance Value: 361 Ohm
Lead Channel Impedance Value: 418 Ohm
Lead Channel Impedance Value: 456 Ohm
Lead Channel Pacing Threshold Amplitude: 0.75 V
Lead Channel Pacing Threshold Amplitude: 1 V
Lead Channel Pacing Threshold Pulse Width: 0.4 ms
Lead Channel Pacing Threshold Pulse Width: 0.4 ms
Lead Channel Sensing Intrinsic Amplitude: 1.25 mV
Lead Channel Sensing Intrinsic Amplitude: 1.25 mV
Lead Channel Sensing Intrinsic Amplitude: 12.375 mV
Lead Channel Sensing Intrinsic Amplitude: 12.375 mV
Lead Channel Setting Pacing Amplitude: 2 V
Lead Channel Setting Pacing Amplitude: 2.5 V
Lead Channel Setting Pacing Pulse Width: 0.4 ms
Lead Channel Setting Sensing Sensitivity: 2.8 mV

## 2022-06-19 NOTE — Progress Notes (Signed)
Remote pacemaker transmission.   

## 2022-07-10 ENCOUNTER — Other Ambulatory Visit: Payer: Self-pay | Admitting: Urology

## 2022-09-12 ENCOUNTER — Ambulatory Visit (INDEPENDENT_AMBULATORY_CARE_PROVIDER_SITE_OTHER): Payer: BC Managed Care – PPO

## 2022-09-12 DIAGNOSIS — I428 Other cardiomyopathies: Secondary | ICD-10-CM

## 2022-09-13 LAB — CUP PACEART REMOTE DEVICE CHECK
Battery Remaining Longevity: 30 mo
Battery Voltage: 2.95 V
Brady Statistic AP VP Percent: 20.35 %
Brady Statistic AP VS Percent: 0.01 %
Brady Statistic AS VP Percent: 79.21 %
Brady Statistic AS VS Percent: 0.43 %
Brady Statistic RA Percent Paced: 19.96 %
Brady Statistic RV Percent Paced: 99.55 %
Date Time Interrogation Session: 20231229140403
Implantable Lead Connection Status: 753985
Implantable Lead Connection Status: 753985
Implantable Lead Implant Date: 20161228
Implantable Lead Implant Date: 20161228
Implantable Lead Location: 753859
Implantable Lead Location: 753860
Implantable Lead Model: 5076
Implantable Lead Model: 5076
Implantable Pulse Generator Implant Date: 20161228
Lead Channel Impedance Value: 342 Ohm
Lead Channel Impedance Value: 380 Ohm
Lead Channel Impedance Value: 380 Ohm
Lead Channel Impedance Value: 418 Ohm
Lead Channel Pacing Threshold Amplitude: 0.875 V
Lead Channel Pacing Threshold Amplitude: 1 V
Lead Channel Pacing Threshold Pulse Width: 0.4 ms
Lead Channel Pacing Threshold Pulse Width: 0.4 ms
Lead Channel Sensing Intrinsic Amplitude: 0.75 mV
Lead Channel Sensing Intrinsic Amplitude: 0.75 mV
Lead Channel Sensing Intrinsic Amplitude: 11.75 mV
Lead Channel Sensing Intrinsic Amplitude: 11.75 mV
Lead Channel Setting Pacing Amplitude: 2 V
Lead Channel Setting Pacing Amplitude: 2.5 V
Lead Channel Setting Pacing Pulse Width: 0.4 ms
Lead Channel Setting Sensing Sensitivity: 2.8 mV
Zone Setting Status: 755011
Zone Setting Status: 755011

## 2022-09-26 ENCOUNTER — Other Ambulatory Visit (HOSPITAL_COMMUNITY): Payer: Self-pay | Admitting: Family Medicine

## 2022-09-26 ENCOUNTER — Encounter: Payer: Self-pay | Admitting: Family Medicine

## 2022-09-26 DIAGNOSIS — R413 Other amnesia: Secondary | ICD-10-CM

## 2022-09-26 DIAGNOSIS — R41840 Attention and concentration deficit: Secondary | ICD-10-CM

## 2022-09-30 NOTE — Progress Notes (Signed)
Remote pacemaker transmission.   

## 2022-10-01 ENCOUNTER — Encounter: Payer: Self-pay | Admitting: Internal Medicine

## 2022-10-28 NOTE — Progress Notes (Unsigned)
Referring Provider: Lois Huxley, PA Primary Care Physician:  Lois Huxley, PA Primary Gastroenterologist:  Dr. Gala Romney  Chief Complaint  Patient presents with   Abdominal Pain    Gallbladder issues, nausea.     HPI:   [name] [name] is a 67 y.o. male with history of CAD, MI, CABG, CHF, heart block s/p pacemaker, PAF on Xarelto, HTN, HLD, bipolar disorder, presenting today at the request of Lois Huxley, [place] for nausea and dry heaving.   Today:  Spells of nausea and dry heaving. Coffee/sofa will cause him to get an "air bubble" in his chest that is very painful. This occurs daily. No food dysphagia. No exertional cheat pain.   Can go 3-4 days without nausea or abdominal pain. Excruciating pain intermittently in RUQ. Triggered by coffee. Has 1 cup of coffee a day and 1 soda a day. No association with fried foods or spicy foods. BBQ sauce will cause pain in his chest. No typical heartburn or reflux symptoms at this time. Has been on omeprazole 20 mg for 15-20 years. PCP just increased omeprazole to 40 mg daily. Waiting on this prescription. Doesn't have much of an appetite. No real early satiety.   Taking Zofran prn for nausea.   Having a lot of memory trouble. This is chronic. Thinks it started when he was on [place] which he is no longer taking. Will forget what he is saying mid sentence. 14 years ago went to rehab for pain pills and alcohol. Planning to have MRI of his brain tomorrow.   No brbpr or melena. No real constipation or diarrhea. Sometimes think he has to have a BM, but doesn't. Usually has 1+ BMs daily.   NSAIDs: None Alcohol: None  Prior EGD: Never Prior colonoscopy: at age 44. Possibly at [facility]. No polyps per patient.   Severe fatty liver and splenomegaly on CT chest with contrast 01/11/21. Fatty liver on CT chest without contrast December 2022.  LFTs normal in 2021. Platelets normal in March 2023.  Just had blood work with PCP last week.  No abdominal  distention, lower extremity edema, yellowing of the eyes or skin, bruising/bleeding.   On exam, patient also had tenderness in right lower quadrant.  He reports this is chronic and intermittent.  Has been present for years.  Comes and go with no identified trigger.  Not related to meals or bowel movements.   PCP is managing Xarelto.  Past Medical History:  Diagnosis Date   ALLERGIC RHINITIS 05/14/2010   Qualifier: Diagnosis of  By: Claybon Jabs PA, [place]     Asthma    Asthma    Atrial fibrillation [facility])    new November 2016   Bipolar 1 disorder [facility])    "put me on this after I got out of drug rehab 11/18/2005 and couldn't sleep; added anxiety RX and have been sleeping fine since"   Bradycardia    Very limited Wenkebach on event recorder November 2010   CAD (coronary artery disease) 2006   3v CAD >> s/p CABG in [place], patent LIMA to LAD and SVG to diagonal with 40% stenosis 2010 // Nuc 2/14: low risk, EF 56 // LHC 3/16: patent L-LAD, patent S-Dx // Echo 3/16: mild conc LVH, EF 60-65, Gr 1 DD, trivial TR, mild LAE // LHC 12/19: oLAD 100, pLCx 20, S-D1 patent, L-LAD patent, EF 45   Cardiac pacemaker in situ 08/18/2018   CHB (complete heart block) ([place]) 12/02/2014   Chest pain with moderate  risk of acute coronary syndrome    Chronic systolic CHF (congestive heart failure) ([place]) 09/15/2018   EF 45 by LHC in 12/19 // Echo 09/2018: inf HK, EF 45-50, mod LAE   Dyslipidemia    mixed    ED (erectile dysfunction)    Ejection fraction    EF 50%, catheterization, September, 2010   //   EF 60-65%, echo, December 03, 2014    Essential hypertension    GERD (gastroesophageal reflux disease)    Gout    History of stomach ulcers 1980's   Hx of CABG    2006, [place] loss-SVD on MRI March 2015 11/18/2013   Mixed hyperlipidemia    pt states "they took me off q med they had me on for high cholesterol; my good cholesterol is low" (09/13/2015)   Myocardial infarction ([place]) 2006   OSA (obstructive sleep  apnea)    patient denies, stated had a sleep study and he was told he did not have     PAF (paroxysmal atrial fibrillation) ([place]) 08/18/2018   Palpitations    Event recorder November, 2010, no significant arrhythmias    Pneumonia 2015   Pneumonia 1990's X 4 in one year   "double pneumonia"   Presence of permanent cardiac pacemaker    Second degree AV block 09/13/2015   Second degree AV block, Mobitz type I 03/10/2013   Patient had Mobitz 1 heart block that was asymptomatic. This resolved off beta blockade while in the hospital March, 2016. Plan to keep the patient off beta blockers. Patient will be seen in follow-up by Dr. Lovena Le and the [place] office.    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[facility])     Past Surgical History:  Procedure Laterality Date   BACK SURGERY     CARDIAC CATHETERIZATION     "I've had quite a few; don't remember any stents" (09/13/2015)   CORONARY ANGIOPLASTY     CORONARY ARTERY BYPASS GRAFT  2006   "CABG X2, in [place]"   EP IMPLANTABLE DEVICE N/A 09/13/2015   Procedure: Pacemaker Implant;  Surgeon: Thompson Grayer, MD;  Location: [place] [facility];  Service: Cardiovascular;  Laterality: N/A;   INSERT / REPLACE / REMOVE PACEMAKER  09/13/2015   Medtronic   LEFT HEART CATH AND CORS/GRAFTS ANGIOGRAPHY N/A 08/26/2018   Procedure: LEFT HEART CATH AND CORS/GRAFTS ANGIOGRAPHY;  Surgeon: Troy Sine, MD;  Location: [place] [facility];  Service: Cardiovascular;  Laterality: N/A;   LEFT HEART CATHETERIZATION WITH CORONARY/GRAFT ANGIOGRAM N/A 12/05/2014   Procedure: LEFT HEART CATHETERIZATION WITH Beatrix Fetters;  Surgeon: Peter M Martinique, MD;  Location: [facility] CATH LAB;  Service: Cardiovascular;  Laterality: N/A;   LEG SURGERY Left 1961   "had 5 boils come up on my leg; they had to cut them out and put a drainage tube in there"   LUMBAR DISC SURGERY  X 3   "ruptured disc"   MAXIMUM ACCESS (MAS)POSTERIOR LUMBAR INTERBODY FUSION (PLIF) 1 LEVEL  ~ 2003   "L4-5; put  rods & screws in"    Current Outpatient Medications  Medication Sig Dispense Refill   allopurinol (ZYLOPRIM) 300 MG tablet Take 300 mg by mouth every evening.     COLCRYS 0.6 MG tablet Take 0.6 mg by mouth daily as needed (Gout).   0   loratadine (CLARITIN) 10 MG tablet Take 10 mg by mouth daily.     Melatonin 10 MG SUBL 1 tablet under the tongue and allow to dissolve at bedtime as  needed Sublingual at bedtime     metoprolol succinate (TOPROL-XL) 25 MG 24 hr tablet TAKE 1 TABLET BY MOUTH EVERY DAY. MAY TAKE 1/2 TO 1 TABLET EXTRA FOR PALITATTIONS 60 tablet 11   nitroGLYCERIN (NITROLINGUAL) 0.4 MG/SPRAY spray USE 1 SPRAY UNDER THE TONGUE EVERY 5 MINUTES AS NEEDED FOR CHEST PAIN 12 g 3   omeprazole (PRILOSEC) 20 MG capsule Take 20 mg by mouth daily.     QUEtiapine (SEROQUEL) 300 MG tablet Take 300 mg by mouth at bedtime.     rivaroxaban (XARELTO) 20 MG TABS tablet Take 1 tablet (20 mg total) by mouth daily with supper. 30 tablet    rosuvastatin (CRESTOR) 10 MG tablet TAKE 1 TABLET BY MOUTH EVERY DAY 30 tablet 0   silodosin (RAPAFLO) 8 MG CAPS capsule TAKE 1 CAPSULE (8 MG TOTAL) BY MOUTH AT BEDTIME. 90 capsule 3   tamsulosin (FLOMAX) 0.4 MG CAPS capsule Take 0.4 mg by mouth daily.   11   temazepam (RESTORIL) 30 MG capsule Take 30 mg by mouth at bedtime.   5   valsartan (DIOVAN) 320 MG tablet Take 1 tablet by mouth daily.     Magnesium 300 MG CAPS 1 capsule with a meal Orally twice a day (Patient not taking: Reported on 10/30/2022)     omeprazole (PRILOSEC) 40 MG capsule Take 40 mg by mouth daily.     No current facility-administered medications for this visit.    Allergies as of 10/30/2022 - Review Complete 10/30/2022  Allergen Reaction Noted   Sulfonamide derivatives Hives    Zolpidem Hives 10/30/2022   Zolpidem tartrate Other (See Comments) 05/04/2021    Family History  Problem Relation Age of Onset   COPD Mother    Lung cancer Mother    Stroke Mother    Heart attack Mother     Heart disease Father    Heart attack Father    Hyperlipidemia Brother    Hypertension Brother    Hypertension Brother    Hypertension Sister     Social History   Socioeconomic History   Marital status: Married    Spouse name: Not on file   Number of children: Not on file   Years of education: Not on file   Highest education level: Not on file  Occupational History   Not on file  Tobacco Use   Smoking status: Former    Packs/day: 2.00    Years: 17.00    Total pack years: 34.00    Types: Cigarettes    Quit date: 03/13/2017    Years since quitting: 5.6   Smokeless tobacco: Current    Types: Snuff   Tobacco comments:    09/13/2015 "quit chewing tobacco August 2016"  Vaping Use   Vaping Use: Never used  Substance and Sexual Activity   Alcohol use: Not Currently    Comment: 09/13/2015 "got out of alcohol rehab 11/18/2005"   Drug use: Not Currently    Comment: 09/13/2015 "got out of rehab for pills 11/18/2005"   Sexual activity: Yes    Birth control/protection: None  Other Topics Concern   Not on file  Social History Narrative   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    Social Determinants of Health   Financial Resource Strain: Not on file  Food Insecurity: Not on file  Transportation Needs: Not on file  Physical Activity: Not on file  Stress: Not on file  Social Connections: Not on file  Intimate Partner Violence: Not on file  Review of Systems: Gen: Denies any fever, chills, cold or flulike symptoms, presyncope, syncope. CV: Denies chest pain, heart palpitations. Resp: Denies shortness of breath, cough.  GI: See PI GU : Denies urinary burning, urinary frequency, urinary hesitancy MS: Denies joint pain. Derm: Denies rash. Psych: Denies depression, anxiety. Heme: See HPI  Physical Exam: BP 135/73 (BP Location: Right Arm, Patient Position: Sitting, Cuff Size: Large)   Pulse 82   Temp 97.6 F (36.4 C) (Temporal)   Ht 5' 7.5" (1.715 m)    Wt 231 lb 3.2 oz (104.9 kg)   SpO2 96%   BMI 35.68 kg/m  General:   Alert and oriented. Pleasant and cooperative. Well-nourished and well-developed.  Head:  Normocephalic and atraumatic. Eyes:  Without icterus, sclera clear and conjunctiva pink.  Ears:  Normal auditory acuity. Lungs:  Clear to auscultation bilaterally. No wheezes, rales, or rhonchi. No distress.  Heart:  S1, S2 present without murmurs appreciated.  Abdomen:  +BS, soft, obese with large AP diameter. Mild to moderate TTP in RUQ and RLQ without rebound or guarding.  No HSM noted. No guarding or rebound. No masses appreciated.  Rectal:  Deferred  Msk:  Symmetrical without gross deformities. Normal posture. Extremities:  Without edema. Neurologic:  Alert and  oriented x4;  grossly normal neurologically. Skin:  Intact without significant lesions or rashes. Psych:  Normal mood and affect.    Assessment:  67 y.o. male with history of CAD, MI, CABG, CHF, heart block s/p pacemaker, PAF on Xarelto, HTN, HLD, bipolar disorder, presenting today for further evaluation of abdominal pain, nausea.  Also discussed need for colon cancer screening and fatty liver.  RUQ abdominal pain/nausea without vomiting: Intermittent RUQ abdominal pain, nausea without vomiting possibly secondary to GERD, gastritis, duodenitis, PUD.  Symptoms seem less consistent with biliary colic, but unable to rule this out as he does have evidence of cholelithiasis on chest CT in 2022.  Denies NSAIDs.  PCP recently increased omeprazole to 40 mg daily and patient is waiting on this prescription.  We discussed proceeding with EGD to evaluate his upper GI tract.  If EGD is unrevealing and symptoms continue despite increased dose of omeprazole, would consider referral to general surgery to discuss cholecystectomy.  He prefers to proceed with this route rather than seeing general surgery at this time.  Atypical chest pain: Described a sensation of a "bubble" in his mid  chest after drinking coffee or soda.  Feels like he needs to burp, but cannot.  Possibly secondary to GERD.  No exertional chest pain.  No solid food dysphagia.  Planning for EGD in the near future due to chronic GERD.  Omeprazole also recently increased to 40 mg daily by PCP, waiting on prescription. We will see if this helps.   GERD: Chronic for 15-20 years.  Typical heartburn symptoms are well-controlled on omeprazole 20 mg daily, but due to some atypical chest pain, RUQ abdominal pain, nausea, PCP recently increased omeprazole to 40 mg daily.  He is waiting on this prescription.  He needs an EGD for Barrett's esophagus screening due to chronic GERD.  Colon cancer screening: Due for screening colonoscopy.  Thinks last colonoscopy was at age 75 without polyps.  No colonoscopy records on file.  Fatty liver/splenomegaly: See fatty liver and splenomegaly noted on chest CT  01/11/2021.  LFTs normal in 2021.  Platelets normal in March 2023.  At this time, he has no symptoms of decompensated liver disease at this time.  Patient just had blood  work completed in February with PCP.  Will request these for review.   We will go ahead and update and ultrasound with elastography to reevaluate fatty liver and any degree of fibrosis.   Plan:  Request recent labs from PCP. Abdominal ultrasound with elastography. Proceed with upper endoscopy and colonoscopy with propofol by Dr. Gala Romney in near future. The risks, benefits, and alternatives have been discussed with the patient in detail. The patient states understanding and desires to proceed.  ASA 3 We will get approval from patient's PCP to hold Xarelto for 48 hours. Start taking omeprazole 40 mg daily as prescribed by PCP. Avoid coffee and soda for now.  Continue to use Zofran as needed.  Counseled on fatty liver.  Separate written instructions provided on AVS. Follow-up after procedures.    Aliene Altes, PA-C [facility] 10/30/2022

## 2022-10-30 ENCOUNTER — Telehealth: Payer: Self-pay | Admitting: *Deleted

## 2022-10-30 ENCOUNTER — Encounter: Payer: Self-pay | Admitting: Gastroenterology

## 2022-10-30 ENCOUNTER — Ambulatory Visit (INDEPENDENT_AMBULATORY_CARE_PROVIDER_SITE_OTHER): Payer: Medicare Other | Admitting: Gastroenterology

## 2022-10-30 VITALS — BP 135/73 | HR 82 | Temp 97.6°F | Ht 67.5 in | Wt 231.2 lb

## 2022-10-30 DIAGNOSIS — R110 Nausea: Secondary | ICD-10-CM | POA: Diagnosis not present

## 2022-10-30 DIAGNOSIS — R1011 Right upper quadrant pain: Secondary | ICD-10-CM | POA: Diagnosis not present

## 2022-10-30 DIAGNOSIS — K760 Fatty (change of) liver, not elsewhere classified: Secondary | ICD-10-CM

## 2022-10-30 DIAGNOSIS — Z1211 Encounter for screening for malignant neoplasm of colon: Secondary | ICD-10-CM

## 2022-10-30 DIAGNOSIS — K219 Gastro-esophageal reflux disease without esophagitis: Secondary | ICD-10-CM

## 2022-10-30 DIAGNOSIS — R161 Splenomegaly, not elsewhere classified: Secondary | ICD-10-CM

## 2022-10-30 DIAGNOSIS — R0789 Other chest pain: Secondary | ICD-10-CM

## 2022-10-30 NOTE — Patient Instructions (Addendum)
We will arrange for you to have an upper endoscopy and colonoscopy in the near future with Dr. Gala Romney.  We will reach out to your primary doctor to get approval to hold Xarelto x 48 hours prior to your procedures.   Start your new increased dose of omeprazole 40 mg every day.   Avoid coffee and soda for now.   Continue to use Zofran as needed.  We will arrange for you to have an ultrasound of your liver at [facility].   I am requesting your recent blood work from your primary doctor and will let you know if anything additional is needed.  Instructions for fatty liver: Recommend 1-2# weight loss per week until ideal body weight through exercise & diet. Low fat/cholesterol diet.   Avoid sweets, sodas, fruit juices, sweetened beverages like tea, etc. Gradually increase exercise from 15 min daily up to 1 hr per day 5 days/week.   We will see you back after your procedures.  Do not hesitate to call sooner if you have questions or concerns.  It was a pleasure to meet you today! I want to create trusting relationships with patients. If you receive a survey regarding your visit,  I greatly appreciate you taking time to fill this out on paper or through your MyChart. I value your feedback.  Aliene Altes, PA-C [facility]

## 2022-10-30 NOTE — Telephone Encounter (Signed)
  Request for patient to stop medication prior to procedure or is needing cleareance  10/30/22  [name] [name] June 12, 1956  What type of surgery is being performed? Colonoscopy & Esophagogastroduodenoscopy (EGD)  When is surgery scheduled? TBD  What type of clearance is required (medical or pharmacy to hold medication or both? medication  Are there any medications that need to be held prior to surgery and how long? Xarelto x 2 days  Name of physician performing surgery?  [facility] [employer]: [phone] Fax: [phone]  Anethesia type (none, local, MAC, general)? MAC

## 2022-10-31 ENCOUNTER — Ambulatory Visit (HOSPITAL_COMMUNITY)
Admission: RE | Admit: 2022-10-31 | Discharge: 2022-10-31 | Disposition: A | Discharge status: Home / Self Care | Payer: Medicare Other | Source: Ambulatory Visit | Attending: Family Medicine | Admitting: Family Medicine

## 2022-10-31 DIAGNOSIS — R41840 Attention and concentration deficit: Secondary | ICD-10-CM | POA: Insufficient documentation

## 2022-10-31 DIAGNOSIS — R413 Other amnesia: Secondary | ICD-10-CM | POA: Insufficient documentation

## 2022-10-31 MED ORDER — GADOBUTROL 1 MMOL/ML IV SOLN
10.0000 mL | Freq: Once | INTRAVENOUS | Status: AC | PRN
  Administered 2022-10-31: 10 mL via INTRAVENOUS

## 2022-10-31 NOTE — Progress Notes (Signed)
Per order, Changed device to MRI sure scan mode DOO 90 bpm for scan.   Tachy therapies off if applicable.   Will program back to regular settings after MRI and send post transmission.

## 2022-11-04 ENCOUNTER — Encounter: Payer: Self-pay | Admitting: Gastroenterology

## 2022-11-04 ENCOUNTER — Telehealth: Payer: Self-pay | Admitting: *Deleted

## 2022-11-04 NOTE — Telephone Encounter (Signed)
Pt has been scheduled for tele pre op appt 11/08/22 @ 3 pm. Med rec and consent are done.

## 2022-11-04 NOTE — Telephone Encounter (Signed)
Pt has been scheduled for tele pre op appt 11/08/22 @ 3 pm. Med rec and consent are done.     Patient Consent for Virtual Visit        [name] [name] has provided verbal consent on 11/04/2022 for a virtual visit (video or telephone).   CONSENT FOR VIRTUAL VISIT FOR:  [name] [name]  By participating in this virtual visit I agree to the following:  I hereby voluntarily request, consent and authorize [facility] and its employed or contracted physicians, physician assistants, nurse practitioners or other licensed health care professionals (the Practitioner), to provide me with telemedicine health care services (the "Services") as deemed necessary by the treating Practitioner. I acknowledge and consent to receive the Services by the Practitioner via telemedicine. I understand that the telemedicine visit will involve communicating with the Practitioner through live audiovisual communication technology and the disclosure of certain medical information by electronic transmission. I acknowledge that I have been given the opportunity to request an in-person assessment or other available alternative prior to the telemedicine visit and am voluntarily participating in the telemedicine visit.  I understand that I have the right to withhold or withdraw my consent to the use of telemedicine in the course of my care at any time, without affecting my right to future care or treatment, and that the Practitioner or I may terminate the telemedicine visit at any time. I understand that I have the right to inspect all information obtained and/or recorded in the course of the telemedicine visit and may receive copies of available information for a reasonable fee.  I understand that some of the potential risks of receiving the Services via telemedicine include:  Delay or interruption in medical evaluation due to technological equipment failure or disruption; Information transmitted may not be sufficient  (e.g. poor resolution of images) to allow for appropriate medical decision making by the Practitioner; and/or  In rare instances, security protocols could fail, causing a breach of personal health information.  Furthermore, I acknowledge that it is my responsibility to provide information about my medical history, conditions and care that is complete and accurate to the best of my ability. I acknowledge that Practitioner's advice, recommendations, and/or decision may be based on factors not within their control, such as incomplete or inaccurate data provided by me or distortions of diagnostic images or specimens that may result from electronic transmissions. I understand that the practice of medicine is not an exact science and that Practitioner makes no warranties or guarantees regarding treatment outcomes. I acknowledge that a copy of this consent can be made available to me via my patient portal ([facility]), or I can request a printed copy by calling the office of [facility].    I understand that my insurance will be billed for this visit.   I have read or had this consent read to me. I understand the contents of this consent, which adequately explains the benefits and risks of the Services being provided via telemedicine.  I have been provided ample opportunity to ask questions regarding this consent and the Services and have had my questions answered to my satisfaction. I give my informed consent for the services to be provided through the use of telemedicine in my medical care

## 2022-11-04 NOTE — Telephone Encounter (Signed)
Primary Cardiologist:Gregg Lovena Le, MD   Preoperative team, please contact this patient and set up a phone call appointment for further preoperative risk assessment. Please obtain consent and complete medication review. Thank you for your help.   I confirm that guidance regarding antiplatelet and oral anticoagulation therapy has been completed and, if necessary, noted below.  Per office protocol, patient can hold Xarelto for 2 days prior to procedure.   Emmaline Life, NP-C  11/04/2022, 4:54 PM [address], [address] Office [phone] Fax [phone]

## 2022-11-04 NOTE — Telephone Encounter (Signed)
Patient with diagnosis of afib on Xarelto for anticoagulation.    Procedure: colonoscopy and EGD Date of procedure: TBD  CHA2DS2-VASc Score = 3  This indicates a 3.2% annual risk of stroke. The patient's score is based upon: CHF History: 1 HTN History: 1 Diabetes History: 0 Stroke History: 0 Vascular Disease History: 1 Age Score: 0 Gender Score: 0  CrCl 78m/min using adjusted body weight Platelet count 284K  Per office protocol, patient can hold Xarelto for 2 days prior to procedure.    **This guidance is not considered finalized until pre-operative APP has relayed final recommendations.**

## 2022-11-08 ENCOUNTER — Telehealth: Payer: Self-pay

## 2022-11-08 ENCOUNTER — Ambulatory Visit: Payer: Medicare Other

## 2022-11-08 NOTE — Telephone Encounter (Signed)
Per the pre op APP today [name] Browner, NP asked for me to cancel the tele appt 3 pm for the today, as clearance is just for medication clearance per the clearance request notes GI placed themselves. I have left a message for the [name] [name] the appt for today will be cancelled and that we will send notes to Dr. Gala Romney for clearance. Left message he will need to hold Xarelto x 2 days prior to procedure and resume once Dr. Gala Romney has given the ok to restart.

## 2022-11-08 NOTE — Telephone Encounter (Signed)
Cardiac clearance scanned under media for review.

## 2022-11-08 NOTE — Telephone Encounter (Signed)
Please advise. Thank you

## 2022-11-08 NOTE — Telephone Encounter (Signed)
   Patient Name: [name] [name]  DOB: 1956-04-05 MRN: [id number]  Primary Cardiologist: Cristopher Peru, MD  Clinical pharmacists have reviewed the patient's past medical history, labs, and current medications as part of preoperative protocol coverage. The following recommendations have been made:  Patient with diagnosis of afib on Xarelto for anticoagulation.     Procedure: colonoscopy and EGD Date of procedure: TBD   CHA2DS2-VASc Score = 3  This indicates a 3.2% annual risk of stroke. The patient's score is based upon: CHF History: 1 HTN History: 1 Diabetes History: 0 Stroke History: 0 Vascular Disease History: 1 Age Score: 0 Gender Score: 0   CrCl 57m/min using adjusted body weight Platelet count 284K   Per office protocol, patient can hold Xarelto for 2 days prior to procedure. Please resume Xarelto as soon as possible postprocedure, at the discretion of the surgeon.    I will route this recommendation to the requesting party via Epic fax function and remove from pre-op pool.  Please call with questions.  ELenna Sciara NP 11/08/2022, 10:26 AM

## 2022-11-10 ENCOUNTER — Telehealth: Payer: Self-pay | Admitting: Gastroenterology

## 2022-11-10 NOTE — Telephone Encounter (Signed)
Received labs from PCP dated 10/22/2022.  LFTs are mildly elevated with AST 66, ALT 64.  Alk phos within normal limits at 60.  Total bilirubin slightly elevated at 1.3.  Creatinine elevated at 1.31.  Hepatitis C antibody nonreactive.   I do not have recent labs to compare to, but per result note, "elevated liver enzymes as seen before but are slightly more elevated."  [name]: Please let patient know I received his blood work he had completed in February with his primary care doctor.  His liver enzymes are elevated.  This may be secondary to fatty liver, but unable to rule out other causes.  Recommend completing additional blood work to ruse out other causes including viral hepatitis, autoimmune causes, and hemochromatosis (a condition that causes too much iron to accumulate in the body). We also need to recheck his bilirubin and fractionate it to see if it is elevated because of his liver or something else.   Please arrange: Hep B surface Ab, Hep B core Ab, Hep B surface Ag, ANA, AMA, ASMA, immunoglobulins (IgG, IgA, IgM), iron panel with ferritin, bilirubin fractionated.   Dx: Elevated LFTs

## 2022-11-10 NOTE — Telephone Encounter (Signed)
Reviewed. Patient cleared to hold Xarelto x 48 hours prior to procedure. Please proceed with scheduling.

## 2022-11-11 ENCOUNTER — Other Ambulatory Visit: Payer: Self-pay | Admitting: *Deleted

## 2022-11-11 DIAGNOSIS — K760 Fatty (change of) liver, not elsewhere classified: Secondary | ICD-10-CM

## 2022-11-11 DIAGNOSIS — R7989 Other specified abnormal findings of blood chemistry: Secondary | ICD-10-CM

## 2022-11-11 NOTE — Telephone Encounter (Signed)
Noted  

## 2022-11-11 NOTE — Telephone Encounter (Signed)
LMTRC

## 2022-11-11 NOTE — Telephone Encounter (Signed)
Spoke to pt, informed him of results and recommendations. Pt voiced understanding. Pt states he will come by office and pick up lab requisitions, because he wants labs done at PCP's office. Labs entered into Epic. Left at front desk.

## 2022-11-11 NOTE — Telephone Encounter (Signed)
LMOM for pt to call office  

## 2022-11-13 ENCOUNTER — Ambulatory Visit (HOSPITAL_COMMUNITY)
Admission: RE | Admit: 2022-11-13 | Discharge: 2022-11-13 | Disposition: A | Discharge status: Home / Self Care | Payer: Medicare Other | Source: Ambulatory Visit | Attending: Gastroenterology | Admitting: Gastroenterology

## 2022-11-13 DIAGNOSIS — R161 Splenomegaly, not elsewhere classified: Secondary | ICD-10-CM | POA: Diagnosis present

## 2022-11-13 DIAGNOSIS — K760 Fatty (change of) liver, not elsewhere classified: Secondary | ICD-10-CM | POA: Diagnosis present

## 2022-11-14 LAB — IGG, IGA, IGM
IgA/Immunoglobulin A, Serum: 118 mg/dL (ref 61–437)
IgG (Immunoglobin G), Serum: 759 mg/dL (ref 603–1613)
IgM (Immunoglobulin M), Srm: 97 mg/dL (ref 20–172)

## 2022-11-14 LAB — BILIRUBIN, FRACTIONATED(TOT/DIR/INDIR)
Bilirubin Total: 0.6 mg/dL (ref 0.0–1.2)
Bilirubin, Direct: 0.21 mg/dL (ref 0.00–0.40)
Bilirubin, Indirect: 0.39 mg/dL (ref 0.10–0.80)

## 2022-11-14 LAB — ANA: Anti Nuclear Antibody (ANA): NEGATIVE

## 2022-11-14 LAB — IRON,TIBC AND FERRITIN PANEL
Ferritin: 296 ng/mL (ref 30–400)
Iron Saturation: 36 % (ref 15–55)
Iron: 108 ug/dL (ref 38–169)
Total Iron Binding Capacity: 304 ug/dL (ref 250–450)
UIBC: 196 ug/dL (ref 111–343)

## 2022-11-14 LAB — MITOCHONDRIAL ANTIBODIES: Mitochondrial Ab: <20 Units (ref 0.0–20.0)

## 2022-11-14 LAB — HEPATITIS B CORE AB W/REFLEX: Hep B Core Total Ab: NEGATIVE

## 2022-11-14 LAB — HEPATITIS B SURFACE ANTIGEN: Hepatitis B Surface Ag: NEGATIVE

## 2022-11-14 LAB — ANTI-SMOOTH MUSCLE ANTIBODY, IGG: Smooth Muscle Ab: 5 Units (ref 0–19)

## 2022-11-14 LAB — HEPATITIS B SURFACE ANTIBODY,QUALITATIVE: Hep B Surface Ab, Qual: NONREACTIVE

## 2022-12-12 ENCOUNTER — Ambulatory Visit (INDEPENDENT_AMBULATORY_CARE_PROVIDER_SITE_OTHER): Payer: Medicare Other

## 2022-12-12 DIAGNOSIS — I428 Other cardiomyopathies: Secondary | ICD-10-CM | POA: Diagnosis not present

## 2022-12-16 LAB — CUP PACEART REMOTE DEVICE CHECK
Battery Remaining Longevity: 30 mo
Battery Voltage: 2.93 V
Brady Statistic AP VP Percent: 21.09 %
Brady Statistic AP VS Percent: 0 %
Brady Statistic AS VP Percent: 78.83 %
Brady Statistic AS VS Percent: 0.07 %
Brady Statistic RA Percent Paced: 21.06 %
Brady Statistic RV Percent Paced: 99.9 %
Date Time Interrogation Session: 20240401121915
Implantable Lead Connection Status: 753985
Implantable Lead Connection Status: 753985
Implantable Lead Implant Date: 20161228
Implantable Lead Implant Date: 20161228
Implantable Lead Location: 753859
Implantable Lead Location: 753860
Implantable Lead Model: 5076
Implantable Lead Model: 5076
Implantable Pulse Generator Implant Date: 20161228
Lead Channel Impedance Value: 323 Ohm
Lead Channel Impedance Value: 361 Ohm
Lead Channel Impedance Value: 361 Ohm
Lead Channel Impedance Value: 418 Ohm
Lead Channel Pacing Threshold Amplitude: 0.875 V
Lead Channel Pacing Threshold Amplitude: 1.125 V
Lead Channel Pacing Threshold Pulse Width: 0.4 ms
Lead Channel Pacing Threshold Pulse Width: 0.4 ms
Lead Channel Sensing Intrinsic Amplitude: 1.625 mV
Lead Channel Sensing Intrinsic Amplitude: 1.625 mV
Lead Channel Sensing Intrinsic Amplitude: 11.375 mV
Lead Channel Sensing Intrinsic Amplitude: 11.375 mV
Lead Channel Setting Pacing Amplitude: 2.25 V
Lead Channel Setting Pacing Amplitude: 2.5 V
Lead Channel Setting Pacing Pulse Width: 0.4 ms
Lead Channel Setting Sensing Sensitivity: 2.8 mV
Zone Setting Status: 755011
Zone Setting Status: 755011

## 2022-12-17 ENCOUNTER — Encounter: Payer: Self-pay | Admitting: *Deleted

## 2022-12-17 NOTE — Telephone Encounter (Signed)
Mailed letter °

## 2022-12-23 ENCOUNTER — Encounter: Payer: Self-pay | Admitting: *Deleted

## 2022-12-23 ENCOUNTER — Other Ambulatory Visit: Payer: Self-pay | Admitting: *Deleted

## 2022-12-23 MED ORDER — PEG 3350-KCL-NA BICARB-NACL 420 G PO SOLR: 4000.0000 mL | Freq: Once | ORAL | 0 refills | Status: AC

## 2022-12-23 NOTE — Telephone Encounter (Signed)
Pt has been scheduled for 01/16/23. Instructions mailed and prep sent to the pharmacy. 

## 2022-12-24 ENCOUNTER — Encounter: Payer: Self-pay | Admitting: *Deleted

## 2022-12-31 ENCOUNTER — Telehealth: Payer: Self-pay | Admitting: Internal Medicine

## 2022-12-31 NOTE — Telephone Encounter (Signed)
Pt c/o of Chest Pain: STAT if CP now or developed within 24 hours  1. Are you having CP right now?   No  2. Are you experiencing any other symptoms (ex. SOB, nausea, vomiting, sweating)?   Nausea, vomiting  3. How long have you been experiencing CP?   Going on for a while.  Patient has been passing it off as a cramp  4. Is your CP continuous or coming and going?   Coming and going  5. Have you taken Nitroglycerin?  Yes ?  Wife stated patient has had chest pain where his pacemaker is but he used his nitro spray and the pain went away.

## 2022-12-31 NOTE — Telephone Encounter (Signed)
I'll try to see next week. He should go to the ED for pain that will not go away with NTG. GT

## 2022-12-31 NOTE — Telephone Encounter (Signed)
Pt called back per message received from [facility] Triage.   Per Dr. Lewayne Bunting, Pt can follow up with me next week on Thursday in clinic;  But if symptoms continue or worsen, an ER visit is highly recommended;  Tell Pt to NOT wait for next weeks appointment to receive care, go to nearest ER.   Pt called and confirmed the report from [facility];  Pt educated on heart vs chest pain, how ER Triage works, and recommendations for ER care for any chest pain symptoms.  Pt stated at this moment his is not in pain, and asymptomatic.   Pt told that we can schedule an appointment with Dr. Ladona Ridgel next Thursday, and scheduling will reach out to him tomorrow.    HOWEVER, PT strongly advised to go to the nearest ER for any chest pain as described in the note, and to NOT wait for his appointment with Dr. Ladona Ridgel next week.  Pt verbalized understanding, and stated he would probably go to [facility], as it is the closest facility.  Pt appreciated the call back, and verbalized understanding of the need to go to the nearest ER for providers care immediately if symptoms return.    I will forward a scheduling request to Ms. Ashland to obtain an appointment with Dr. Ladona Ridgel.

## 2022-12-31 NOTE — Telephone Encounter (Signed)
Returned call and spoke with patient's wife [name] [name] (OK per DPR).  [name] [name] reports patient has complained of "cramping" pain in left side of chest, around his pacemaker and heart. He describes this pain as "excruciating."   Patient states CP has been coming and going for "months." CP appears to be non-exertional.   Patient used 2 sprays of nitroglycerin on Sunday 4/14 for CP when walking out the door to go to church. These 2 sprays relieved CP. He reports having used NTG spray multiple times over the past month, with relief of CP each time. Has not used NTG since 4/14. Denies CP currently.   [name] [name] states she and patient have been having nausea and vomiting on and off (1 year for her and a couple months for patient).  Patient requests to be seen by Dr. Ladona Ridgel as soon as possible. Refuses to see APP.  Will forward to Dr. Ladona Ridgel and his nurse Weston Brass to see if patient can be worked into schedule and any other recommendations.  Tammy Sours and patient on ED precautions, both verbalized understanding.

## 2023-01-09 ENCOUNTER — Telehealth: Payer: Self-pay | Admitting: Internal Medicine

## 2023-01-09 ENCOUNTER — Encounter: Payer: Self-pay | Admitting: Internal Medicine

## 2023-01-09 ENCOUNTER — Ambulatory Visit: Payer: Medicare Other | Attending: Internal Medicine | Admitting: Internal Medicine

## 2023-01-09 VITALS — BP 112/70 | HR 73 | Ht 67.5 in | Wt 230.0 lb

## 2023-01-09 DIAGNOSIS — Z950 Presence of cardiac pacemaker: Secondary | ICD-10-CM | POA: Diagnosis not present

## 2023-01-09 DIAGNOSIS — Z01812 Encounter for preprocedural laboratory examination: Secondary | ICD-10-CM | POA: Diagnosis not present

## 2023-01-09 DIAGNOSIS — I10 Essential (primary) hypertension: Secondary | ICD-10-CM | POA: Diagnosis not present

## 2023-01-09 DIAGNOSIS — I442 Atrioventricular block, complete: Secondary | ICD-10-CM | POA: Diagnosis not present

## 2023-01-09 LAB — CUP PACEART INCLINIC DEVICE CHECK
Battery Remaining Longevity: 26 mo
Battery Voltage: 2.93 V
Brady Statistic AP VP Percent: 18.48 %
Brady Statistic AP VS Percent: 0 %
Brady Statistic AS VP Percent: 81.44 %
Brady Statistic AS VS Percent: 0.07 %
Brady Statistic RA Percent Paced: 18.44 %
Brady Statistic RV Percent Paced: 99.9 %
Date Time Interrogation Session: 20240425083850
Implantable Lead Connection Status: 753985
Implantable Lead Connection Status: 753985
Implantable Lead Implant Date: 20161228
Implantable Lead Implant Date: 20161228
Implantable Lead Location: 753859
Implantable Lead Location: 753860
Implantable Lead Model: 5076
Implantable Lead Model: 5076
Implantable Pulse Generator Implant Date: 20161228
Lead Channel Impedance Value: 380 Ohm
Lead Channel Impedance Value: 399 Ohm
Lead Channel Impedance Value: 399 Ohm
Lead Channel Impedance Value: 456 Ohm
Lead Channel Pacing Threshold Amplitude: 0.875 V
Lead Channel Pacing Threshold Amplitude: 1 V
Lead Channel Pacing Threshold Amplitude: 1 V
Lead Channel Pacing Threshold Amplitude: 1 V
Lead Channel Pacing Threshold Amplitude: 1.125 V
Lead Channel Pacing Threshold Pulse Width: 0.4 ms
Lead Channel Pacing Threshold Pulse Width: 0.4 ms
Lead Channel Pacing Threshold Pulse Width: 0.4 ms
Lead Channel Pacing Threshold Pulse Width: 0.4 ms
Lead Channel Pacing Threshold Pulse Width: 0.4 ms
Lead Channel Sensing Intrinsic Amplitude: 1.125 mV
Lead Channel Sensing Intrinsic Amplitude: 1.875 mV
Lead Channel Sensing Intrinsic Amplitude: 11.75 mV
Lead Channel Sensing Intrinsic Amplitude: 4.625 mV
Lead Channel Setting Pacing Amplitude: 2.5 V
Lead Channel Setting Pacing Amplitude: 2.5 V
Lead Channel Setting Pacing Pulse Width: 0.4 ms
Lead Channel Setting Sensing Sensitivity: 2.8 mV
Zone Setting Status: 755011
Zone Setting Status: 755011

## 2023-01-09 LAB — CBC WITH DIFFERENTIAL/PLATELET
Basophils Absolute: 0.1 10*3/uL (ref 0.0–0.2)
Basos: 1 %
EOS (ABSOLUTE): 0 10*3/uL (ref 0.0–0.4)
Eos: 1 %
Hematocrit: 45.2 % (ref 37.5–51.0)
Hemoglobin: 15.8 g/dL (ref 13.0–17.7)
Lymphocytes Absolute: 1.4 10*3/uL (ref 0.7–3.1)
Lymphs: 18 %
MCH: 31.8 pg (ref 26.6–33.0)
MCHC: 35 g/dL (ref 31.5–35.7)
MCV: 91 fL (ref 79–97)
Monocytes Absolute: 0.6 10*3/uL (ref 0.1–0.9)
Monocytes: 8 %
Neutrophils Absolute: 5.3 10*3/uL (ref 1.4–7.0)
Neutrophils: 72 %
Platelets: 231 10*3/uL (ref 150–450)
RBC: 4.97 x10E6/uL (ref 4.14–5.80)
RDW: 14.7 % (ref 11.6–15.4)
WBC: 7.4 10*3/uL (ref 3.4–10.8)

## 2023-01-09 LAB — BASIC METABOLIC PANEL
BUN/Creatinine Ratio: 10 (ref 10–24)
BUN: 14 mg/dL (ref 8–27)
CO2: 25 mmol/L (ref 20–29)
Calcium: 9.9 mg/dL (ref 8.6–10.2)
Chloride: 103 mmol/L (ref 96–106)
Creatinine, Ser: 1.34 mg/dL — ABNORMAL HIGH (ref 0.76–1.27)
Glucose: 116 mg/dL — ABNORMAL HIGH (ref 70–99)
Potassium: 4.3 mmol/L (ref 3.5–5.2)
Sodium: 142 mmol/L (ref 134–144)
eGFR: 59 mL/min/{1.73_m2} — ABNORMAL LOW (ref >59)

## 2023-01-09 NOTE — Telephone Encounter (Signed)
Message sent to Endo to cancel procedure. FYI

## 2023-01-09 NOTE — H&P (View-Only) (Signed)
    HPI [name] returns today for follwoup. He is a pleasant 66 yo man with a h/o CAD, s/p CABG, HTN, and gout. He has sinus node dysfunction as well as heart block, s/p PPM insertion. He notes episodes of exertional chest pain, which is different from what he experienced prior to CABG.  More severe. He describes an episode where he was cleaning up his yard, and moving a large limb and developed substernal pain, and he went inside and took a slntg and got relief. He has had other episodes. In addition, he has had some problems with his prostate.  Allergies  Allergen Reactions   Sulfonamide Derivatives Hives   Zolpidem Hives   Zolpidem Tartrate Other (See Comments)     Current Outpatient Medications  Medication Sig Dispense Refill   allopurinol (ZYLOPRIM) 300 MG tablet Take 300 mg by mouth every evening.     colchicine 0.6 MG tablet Take 0.6 mg by mouth daily as needed (Gout).  0   fluticasone (FLONASE) 50 MCG/ACT nasal spray Place 2 sprays into both nostrils daily.     loratadine (CLARITIN) 10 MG tablet Take 10 mg by mouth daily.     Magnesium 300 MG CAPS Take 300 mg by mouth daily.     Melatonin 10 MG SUBL 1 tablet under the tongue and allow to dissolve at bedtime as needed Sublingual at bedtime     metoprolol succinate (TOPROL-XL) 25 MG 24 hr tablet TAKE 1 TABLET BY MOUTH EVERY DAY. MAY TAKE 1/2 TO 1 TABLET EXTRA FOR PALITATTIONS 60 tablet 11   nitroGLYCERIN (NITROLINGUAL) 0.4 MG/SPRAY spray USE 1 SPRAY UNDER THE TONGUE EVERY 5 MINUTES AS NEEDED FOR CHEST PAIN 12 g 3   omeprazole (PRILOSEC) 40 MG capsule Take 40 mg by mouth daily.     POTASSIUM PO Take 1 tablet by mouth daily.     QUEtiapine (SEROQUEL) 300 MG tablet Take 300 mg by mouth at bedtime.     rivaroxaban (XARELTO) 20 MG TABS tablet Take 1 tablet (20 mg total) by mouth daily with supper. 30 tablet    rosuvastatin (CRESTOR) 10 MG tablet TAKE 1 TABLET BY MOUTH EVERY DAY 30 tablet 0   silodosin (RAPAFLO) 8 MG CAPS capsule  TAKE 1 CAPSULE (8 MG TOTAL) BY MOUTH AT BEDTIME. 90 capsule 3   temazepam (RESTORIL) 30 MG capsule Take 30 mg by mouth at bedtime.   5   valsartan-hydrochlorothiazide (DIOVAN-HCT) 320-25 MG tablet Take 1 tablet by mouth daily.     No current facility-administered medications for this visit.     Past Medical History:  Diagnosis Date   ALLERGIC RHINITIS 05/14/2010   Qualifier: Diagnosis of  By: Sampson PA, Dawn     Asthma    Asthma    Atrial fibrillation    new November 2016   Bipolar 1 disorder    "put me on this after I got out of drug rehab 11/18/2005 and couldn't sleep; added anxiety RX and have been sleeping fine since"   Bradycardia    Very limited Wenkebach on event recorder November 2010   CAD (coronary artery disease) 2006   3v CAD >> s/p CABG in [place], patent LIMA to LAD and SVG to diagonal with 40% stenosis 2010 // Nuc 2/14: low risk, EF 56 // LHC 3/16: patent L-LAD, patent S-Dx // Echo 3/16: mild conc LVH, EF 60-65, Gr 1 DD, trivial TR, mild LAE // LHC 12/19: oLAD 100, pLCx 20, S-D1 patent, L-LAD patent, EF   45   Cardiac pacemaker in situ 08/18/2018   CHB (complete heart block) 12/02/2014   Chest pain with moderate risk of acute coronary syndrome    Chronic systolic CHF (congestive heart failure) 09/15/2018   EF 45 by LHC in 12/19 // Echo 09/2018: inf HK, EF 45-50, mod LAE   Dyslipidemia    mixed    ED (erectile dysfunction)    Ejection fraction    EF 50%, catheterization, September, 2010   //   EF 60-65%, echo, December 03, 2014    Essential hypertension    GERD (gastroesophageal reflux disease)    Gout    History of stomach ulcers 1980's   Hx of CABG    2006, [place]    Memory loss-SVD on MRI March 2015 11/18/2013   Mixed hyperlipidemia    pt states "they took me off q med they had me on for high cholesterol; my good cholesterol is low" (09/13/2015)   Myocardial infarction 2006   OSA (obstructive sleep apnea)    patient denies, stated had a sleep study and he was told  he did not have     PAF (paroxysmal atrial fibrillation) 08/18/2018   Palpitations    Event recorder November, 2010, no significant arrhythmias    Pneumonia 2015   Pneumonia 1990's X 4 in one year   "double pneumonia"   Presence of permanent cardiac pacemaker    Second degree AV block 09/13/2015   Second degree AV block, Mobitz type I 03/10/2013   Patient had Mobitz 1 heart block that was asymptomatic. This resolved off beta blockade while in the hospital March, 2016. Plan to keep the patient off beta blockers. Patient will be seen in follow-up by Dr. Fani Rotondo and the [place] office.    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ROS:   All systems reviewed and negative except as noted in the HPI.   Past Surgical History:  Procedure Laterality Date   BACK SURGERY     CARDIAC CATHETERIZATION     "I've had quite a few; don't remember any stents" (09/13/2015)   CORONARY ANGIOPLASTY     CORONARY ARTERY BYPASS GRAFT  2006   "CABG X2, in [place]"   EP IMPLANTABLE DEVICE N/A 09/13/2015   Procedure: Pacemaker Implant;  Surgeon: James Allred, MD;  Location: [facility] INVASIVE [facility];  Service: Cardiovascular;  Laterality: N/A;   INSERT / REPLACE / REMOVE PACEMAKER  09/13/2015   Medtronic   LEFT HEART CATH AND CORS/GRAFTS ANGIOGRAPHY N/A 08/26/2018   Procedure: LEFT HEART CATH AND CORS/GRAFTS ANGIOGRAPHY;  Surgeon: Kelly, Thomas A, MD;  Location: [facility] CV [facility];  Service: Cardiovascular;  Laterality: N/A;   LEFT HEART CATHETERIZATION WITH CORONARY/GRAFT ANGIOGRAM N/A 12/05/2014   Procedure: LEFT HEART CATHETERIZATION WITH CORONARY/GRAFT ANGIOGRAM;  Surgeon: Peter M Jordan, MD;  Location: [facility] CATH LAB;  Service: Cardiovascular;  Laterality: N/A;   LEG SURGERY Left 1961   "had 5 boils come up on my leg; they had to cut them out and put a drainage tube in there"   LUMBAR DISC SURGERY  X 3   "ruptured disc"   MAXIMUM ACCESS (MAS)POSTERIOR LUMBAR INTERBODY FUSION (PLIF) 1 LEVEL  ~ 2003   "L4-5; put rods  & screws in"     Family History  Problem Relation Age of Onset   COPD Mother    Lung cancer Mother    Stroke Mother    Heart attack Mother    Heart disease Father    Heart attack Father      Hyperlipidemia Brother    Hypertension Brother    Hypertension Brother    Hypertension Sister      Social History   Socioeconomic History   Marital status: Married    Spouse name: Not on file   Number of children: Not on file   Years of education: Not on file   Highest education level: Not on file  Occupational History   Not on file  Tobacco Use   Smoking status: Former    Packs/day: 2.00    Years: 17.00    Additional pack years: 0.00    Total pack years: 34.00    Types: Cigarettes    Quit date: 03/13/2017    Years since quitting: 5.8   Smokeless tobacco: Current    Types: Snuff   Tobacco comments:    09/13/2015 "quit chewing tobacco August 2016"  Vaping Use   Vaping Use: Never used  Substance and Sexual Activity   Alcohol use: Not Currently    Comment: 09/13/2015 "got out of alcohol rehab 11/18/2005"   Drug use: Not Currently    Comment: 09/13/2015 "got out of rehab for pills 11/18/2005"   Sexual activity: Yes    Birth control/protection: None  Other Topics Concern   Not on file  Social History Narrative   Employed fulltime- [employer]. Does not regularly exercise.    Social Determinants of Health   Financial Resource Strain: Not on file  Food Insecurity: Not on file  Transportation Needs: Not on file  Physical Activity: Not on file  Stress: Not on file  Social Connections: Not on file  Intimate Partner Violence: Not on file     BP 112/70   Pulse 73   Ht 5' 7.5" (1.715 m)   Wt 230 lb (104.3 kg)   SpO2 98%   BMI 35.49 kg/m   Physical Exam:  Well appearing middle aged man, NAD HEENT: Unremarkable Neck:  No JVD, no thyromegally Lymphatics:  No adenopathy Back:  No CVA tenderness Lungs:  Clear with no wheezes HEART:  Regular rate rhythm, no murmurs, no rubs,  no clicks Abd:  soft, positive bowel sounds, no organomegally, no rebound, no guarding Ext:  2 plus pulses, no edema, no cyanosis, no clubbing Skin:  No rashes no nodules Neuro:  CN II through XII intact, motor grossly intact  EKG - P synch, ventricular pacing  DEVICE  Normal device function.  See PaceArt for details.   Assess/Plan: 1. Heart block - he is asymptomatic s/p PPM insertion. 2. PPM - his medtronic DDD PM is working normally. We will recheck in several months. 3. Chest pain - his symptoms are now exertional and relieved by ntg and worse than what he experienced prior to his CABG. We will schedule a left heart cath ASAP. 4. Obesity - he has lost weight since his last visit. He has not been able to exercise recently due to the anginal symptoms.    Million Maharaj,MD 

## 2023-01-09 NOTE — Progress Notes (Signed)
HPI [name] [name] returns today for follwoup. He is a pleasant 67 yo man with a h/o CAD, s/p CABG, HTN, and gout. He has sinus node dysfunction as well as heart block, s/p PPM insertion. He notes episodes of exertional chest pain, which is different from what he experienced prior to CABG.  More severe. He describes an episode where he was cleaning up his yard, and moving a large limb and developed substernal pain, and he went inside and took a slntg and got relief. He has had other episodes. In addition, he has had some problems with his prostate.  Allergies  Allergen Reactions   Sulfonamide Derivatives Hives   Zolpidem Hives   Zolpidem Tartrate Other (See Comments)     Current Outpatient Medications  Medication Sig Dispense Refill   allopurinol (ZYLOPRIM) 300 MG tablet Take 300 mg by mouth every evening.     colchicine 0.6 MG tablet Take 0.6 mg by mouth daily as needed (Gout).  0   fluticasone (FLONASE) 50 MCG/ACT nasal spray Place 2 sprays into both nostrils daily.     loratadine (CLARITIN) 10 MG tablet Take 10 mg by mouth daily.     Magnesium 300 MG CAPS Take 300 mg by mouth daily.     Melatonin 10 MG SUBL 1 tablet under the tongue and allow to dissolve at bedtime as needed Sublingual at bedtime     metoprolol succinate (TOPROL-XL) 25 MG 24 hr tablet TAKE 1 TABLET BY MOUTH EVERY DAY. MAY TAKE 1/2 TO 1 TABLET EXTRA FOR PALITATTIONS 60 tablet 11   nitroGLYCERIN (NITROLINGUAL) 0.4 MG/SPRAY spray USE 1 SPRAY UNDER THE TONGUE EVERY 5 MINUTES AS NEEDED FOR CHEST PAIN 12 g 3   omeprazole (PRILOSEC) 40 MG capsule Take 40 mg by mouth daily.     POTASSIUM PO Take 1 tablet by mouth daily.     QUEtiapine (SEROQUEL) 300 MG tablet Take 300 mg by mouth at bedtime.     rivaroxaban (XARELTO) 20 MG TABS tablet Take 1 tablet (20 mg total) by mouth daily with supper. 30 tablet    rosuvastatin (CRESTOR) 10 MG tablet TAKE 1 TABLET BY MOUTH EVERY DAY 30 tablet 0   silodosin (RAPAFLO) 8 MG CAPS capsule  TAKE 1 CAPSULE (8 MG TOTAL) BY MOUTH AT BEDTIME. 90 capsule 3   temazepam (RESTORIL) 30 MG capsule Take 30 mg by mouth at bedtime.   5   valsartan-hydrochlorothiazide (DIOVAN-HCT) 320-25 MG tablet Take 1 tablet by mouth daily.     No current facility-administered medications for this visit.     Past Medical History:  Diagnosis Date   ALLERGIC RHINITIS 05/14/2010   Qualifier: Diagnosis of  By: Garnette Czech PA, Dawn     Asthma    Asthma    Atrial fibrillation    new November 2016   Bipolar 1 disorder    "put me on this after I got out of drug rehab 11/18/2005 and couldn't sleep; added anxiety RX and have been sleeping fine since"   Bradycardia    Very limited Wenkebach on event recorder November 2010   CAD (coronary artery disease) 2006   3v CAD >> s/p CABG in [place], patent LIMA to LAD and SVG to diagonal with 40% stenosis 2010 // Nuc 2/14: low risk, EF 56 // LHC 3/16: patent L-LAD, patent S-Dx // Echo 3/16: mild conc LVH, EF 60-65, Gr 1 DD, trivial TR, mild LAE // LHC 12/19: oLAD 100, pLCx 20, S-D1 patent, L-LAD patent, EF  45   Cardiac pacemaker in situ 08/18/2018   CHB (complete heart block) 12/02/2014   Chest pain with moderate risk of acute coronary syndrome    Chronic systolic CHF (congestive heart failure) 09/15/2018   EF 45 by LHC in 12/19 // Echo 09/2018: inf HK, EF 45-50, mod LAE   Dyslipidemia    mixed    ED (erectile dysfunction)    Ejection fraction    EF 50%, catheterization, September, 2010   //   EF 60-65%, echo, December 03, 2014    Essential hypertension    GERD (gastroesophageal reflux disease)    Gout    History of stomach ulcers 1980's   Hx of CABG    2006, [place]    Memory loss-SVD on MRI March 2015 11/18/2013   Mixed hyperlipidemia    pt states "they took me off q med they had me on for high cholesterol; my good cholesterol is low" (09/13/2015)   Myocardial infarction 2006   OSA (obstructive sleep apnea)    patient denies, stated had a sleep study and he was told  he did not have     PAF (paroxysmal atrial fibrillation) 08/18/2018   Palpitations    Event recorder November, 2010, no significant arrhythmias    Pneumonia 2015   Pneumonia 1990's X 4 in one year   "double pneumonia"   Presence of permanent cardiac pacemaker    Second degree AV block 09/13/2015   Second degree AV block, Mobitz type I 03/10/2013   Patient had Mobitz 1 heart block that was asymptomatic. This resolved off beta blockade while in the hospital March, 2016. Plan to keep the patient off beta blockers. Patient will be seen in follow-up by Dr. Ladona Ridgel and the [place] office.    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ROS:   All systems reviewed and negative except as noted in the HPI.   Past Surgical History:  Procedure Laterality Date   BACK SURGERY     CARDIAC CATHETERIZATION     "I've had quite a few; don't remember any stents" (09/13/2015)   CORONARY ANGIOPLASTY     CORONARY ARTERY BYPASS GRAFT  2006   "CABG X2, in [place]"   EP IMPLANTABLE DEVICE N/A 09/13/2015   Procedure: Pacemaker Implant;  Surgeon: Hillis Range, MD;  Location: [facility]VASIVE CV LAB;  Service: Cardiovascular;  Laterality: N/A;   INSERT / REPLACE / REMOVE PACEMAKER  09/13/2015   Medtronic   LEFT HEART CATH AND CORS/GRAFTS ANGIOGRAPHY N/A 08/26/2018   Procedure: LEFT HEART CATH AND CORS/GRAFTS ANGIOGRAPHY;  Surgeon: Lennette Bihari, MD;  Location: [facility]VAS[facility] CV LAB;  Service: Cardiovascular;  Laterality: N/A;   LEFT HEART CATHETERIZATION WITH CORONARY/GRAFT ANGIOGRAM N/A 12/05/2014   Procedure: LEFT HEART CATHETERIZATION WITH Isabel Caprice;  Surgeon: Peter M Swaziland, MD;  Location: [facility] CATH LAB;  Service: Cardiovascular;  Laterality: N/A;   LEG SURGERY Left 1961   "had 5 boils come up on my leg; they had to cut them out and put a drainage tube in there"   LUMBAR DISC SURGERY  X 3   "ruptured disc"   MAXIMUM ACCESS (MAS)POSTERIOR LUMBAR INTERBODY FUSION (PLIF) 1 LEVEL  ~ 2003   "L4-5; put rods  & screws in"     Family History  Problem Relation Age of Onset   COPD Mother    Lung cancer Mother    Stroke Mother    Heart attack Mother    Heart disease Father    Heart attack Father  Hyperlipidemia Brother    Hypertension Brother    Hypertension Brother    Hypertension Sister      Social History   Socioeconomic History   Marital status: Married    Spouse name: Not on file   Number of children: Not on file   Years of education: Not on file   Highest education level: Not on file  Occupational History   Not on file  Tobacco Use   Smoking status: Former    Packs/day: 2.00    Years: 17.00    Additional pack years: 0.00    Total pack years: 34.00    Types: Cigarettes    Quit date: 03/13/2017    Years since quitting: 5.8   Smokeless tobacco: Current    Types: Snuff   Tobacco comments:    09/13/2015 "quit chewing tobacco August 2016"  Vaping Use   Vaping Use: Never used  Substance and Sexual Activity   Alcohol use: Not Currently    Comment: 09/13/2015 "got out of alcohol rehab 11/18/2005"   Drug use: Not Currently    Comment: 09/13/2015 "got out of rehab for pills 11/18/2005"   Sexual activity: Yes    Birth control/protection: None  Other Topics Concern   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    Social Determinants of Health   Financial Resource Strain: Not on file  Food Insecurity: Not on file  Transportation Needs: Not on file  Physical Activity: Not on file  Stress: Not on file  Social Connections: Not on file  Intimate Partner Violence: Not on file     BP 112/70   Pulse 73   Ht 5' 7.5" (1.715 m)   Wt 230 lb (104.3 kg)   SpO2 98%   BMI 35.49 kg/m   Physical Exam:  Well appearing middle aged man, NAD HEENT: Unremarkable Neck:  No JVD, no thyromegally Lymphatics:  No adenopathy Back:  No CVA tenderness Lungs:  Clear with no wheezes HEART:  Regular rate rhythm, no murmurs, no rubs,  no clicks Abd:  soft, positive bowel sounds, no organomegally, no rebound, no guarding Ext:  2 plus pulses, no edema, no cyanosis, no clubbing Skin:  No rashes no nodules Neuro:  CN II through XII intact, motor grossly intact  EKG - P synch, ventricular pacing  DEVICE  Normal device function.  See PaceArt for details.   Assess/Plan: 1. Heart block - he is asymptomatic s/p PPM insertion. 2. PPM - his medtronic DDD PM is working normally. We will recheck in several months. 3. Chest pain - his symptoms are now exertional and relieved by ntg and worse than what he experienced prior to his CABG. We will schedule a left heart cath ASAP. 4. Obesity - he has lost weight since his last visit. He has not been able to exercise recently due to the anginal symptoms.    Sharlot Gowda Nicha Hemann,MD

## 2023-01-09 NOTE — Telephone Encounter (Signed)
Pt came in and said he would have to cancel his procedure on May 2.  He has to have a heart procedure done.

## 2023-01-09 NOTE — Telephone Encounter (Signed)
Pt informed that procedure was cancelled.

## 2023-01-09 NOTE — Telephone Encounter (Signed)
Noted  

## 2023-01-09 NOTE — Patient Instructions (Signed)
Medication Instructions:  Your physician recommends that you continue on your current medications as directed. Please refer to the Current Medication list given to you today.  *If you need a refill on your cardiac medications before your next appointment, please call your pharmacy*  Lab Work: You will have labs drawn CBC and BMET.  If you have labs (blood work) drawn today and your tests are completely normal, you will receive your results only by: MyChart Message (if you have MyChart) OR A paper copy in the mail If you have any lab test that is abnormal or we need to change your treatment, we will call you to review the results.  Testing/Procedures: None ordered.  Follow-Up: Dr. Lewayne Bunting has ordered a Left Heart Catheterization, with Left Ventricular Angiography, for diagnosis of Crescendo Angina.      [facility] A DEPT OF [place]. [facility] AT [facility] [address] [place], [place] [address] [id number] [facility] [place] [address] Dept: [phone] Loc: [phone]  [name] [name]  01/09/2023  You are scheduled for a Cardiac Catheterization on Tuesday, April 30 with Dr. Lance Muss.  1. Please arrive at the [facility] (Main Entrance A) at [facility]: [address] [place], [place] [address] at 5:30 AM (This time is two hours before your procedure to ensure your preparation). Free valet parking service is available. You will check in at ADMITTING. The support person will be asked to wait in the waiting room.  It is OK to have someone drop you off and come back when you are ready to be discharged.    Special note: Every effort is made to have your procedure done on time. Please understand that emergencies sometimes delay scheduled procedures.  2. Diet: Do not eat solid foods after midnight.  The patient may have clear liquids until 5am upon the day of the procedure.  3. Labs: You will need to have blood drawn on  Thursday, April 25 at [facility]. [address], [place]  Open: 7:30am - 5pm    Phone: [phone]. You do not need to be fasting.  4. Medication instructions in preparation for your procedure:   Contrast Allergy: No  *For reference purposes while preparing patient instructions.   Delete this med list prior to printing instructions for patient.*  Stop taking Xarelto (Rivaroxaban) on Saturday, April 27.        On the morning of your procedure, take your Aspirin 81 mg and any morning medicines NOT listed above.  You may use sips of water.  5. Plan to go home the same day, you will only stay overnight if medically necessary. 6. Bring a current list of your medications and current insurance cards. 7. You MUST have a responsible person to drive you home. 8. Someone MUST be with you the first 24 hours after you arrive home or your discharge will be delayed. 9. Please wear clothes that are easy to get on and off and wear slip-on shoes.  Thank you for allowing [place] to care for you!   -- [facility]

## 2023-01-13 ENCOUNTER — Telehealth: Payer: Self-pay | Admitting: *Deleted

## 2023-01-13 NOTE — Telephone Encounter (Signed)
Cardiac Catheterization scheduled at [facility] for: Tuesday January 14, 2023 7:30 AM Arrival time [facility] Main Entrance A at: 5:30 AM  Nothing to eat after midnight prior to procedure, clear liquids until 5 AM day of procedure.   Medication instructions: -Hold:  Xarelto-pt reports last dose 01/10/23-knows to hold  until post procedure  Valsartan-HCT/KCl-day before and day of procedure-per protocol GFR 59-pt already taken today -Other usual morning medications can be taken with sips of water including aspirin 81 mg.  Confirmed patient has responsible adult to drive home post procedure and be with patient first 24 hours after arriving home.  Plan to go home the same day, you will only stay overnight if medically necessary.  Reviewed procedure instructions with patient.

## 2023-01-14 ENCOUNTER — Other Ambulatory Visit: Payer: Self-pay

## 2023-01-14 ENCOUNTER — Encounter (HOSPITAL_COMMUNITY): Payer: Medicare Other

## 2023-01-14 ENCOUNTER — Ambulatory Visit (HOSPITAL_COMMUNITY)
Admission: RE | Admit: 2023-01-14 | Discharge: 2023-01-14 | Disposition: A | Discharge status: Home / Self Care | Payer: Medicare Other | Attending: Interventional Cardiology | Admitting: Interventional Cardiology

## 2023-01-14 ENCOUNTER — Encounter (HOSPITAL_COMMUNITY)
Admission: RE | Disposition: A | Discharge status: Home / Self Care | Payer: Self-pay | Source: Home / Self Care | Attending: Interventional Cardiology

## 2023-01-14 DIAGNOSIS — M109 Gout, unspecified: Secondary | ICD-10-CM | POA: Diagnosis not present

## 2023-01-14 DIAGNOSIS — Z6837 Body mass index (BMI) 37.0-37.9, adult: Secondary | ICD-10-CM | POA: Diagnosis not present

## 2023-01-14 DIAGNOSIS — I495 Sick sinus syndrome: Secondary | ICD-10-CM | POA: Diagnosis not present

## 2023-01-14 DIAGNOSIS — E669 Obesity, unspecified: Secondary | ICD-10-CM | POA: Insufficient documentation

## 2023-01-14 DIAGNOSIS — I442 Atrioventricular block, complete: Secondary | ICD-10-CM | POA: Diagnosis not present

## 2023-01-14 DIAGNOSIS — I110 Hypertensive heart disease with heart failure: Secondary | ICD-10-CM | POA: Insufficient documentation

## 2023-01-14 DIAGNOSIS — I2582 Chronic total occlusion of coronary artery: Secondary | ICD-10-CM | POA: Insufficient documentation

## 2023-01-14 DIAGNOSIS — Z951 Presence of aortocoronary bypass graft: Secondary | ICD-10-CM | POA: Diagnosis not present

## 2023-01-14 DIAGNOSIS — Z950 Presence of cardiac pacemaker: Secondary | ICD-10-CM | POA: Diagnosis not present

## 2023-01-14 DIAGNOSIS — I5022 Chronic systolic (congestive) heart failure: Secondary | ICD-10-CM | POA: Insufficient documentation

## 2023-01-14 DIAGNOSIS — Z87891 Personal history of nicotine dependence: Secondary | ICD-10-CM | POA: Insufficient documentation

## 2023-01-14 DIAGNOSIS — I25118 Atherosclerotic heart disease of native coronary artery with other forms of angina pectoris: Secondary | ICD-10-CM | POA: Diagnosis present

## 2023-01-14 HISTORY — PX: LEFT HEART CATH AND CORS/GRAFTS ANGIOGRAPHY: CATH118250

## 2023-01-14 SURGERY — LEFT HEART CATH AND CORS/GRAFTS ANGIOGRAPHY
Anesthesia: LOCAL

## 2023-01-14 MED ORDER — ONDANSETRON HCL 4 MG/2ML IJ SOLN: 4.0000 mg | Freq: Four times a day (QID) | INTRAMUSCULAR | Status: DC | PRN

## 2023-01-14 MED ORDER — MIDAZOLAM HCL 2 MG/2ML IJ SOLN
INTRAMUSCULAR | Status: DC | PRN
  Administered 2023-01-14 (×2): 1 mg via INTRAVENOUS

## 2023-01-14 MED ORDER — SODIUM CHLORIDE 0.9 % IV SOLN: INTRAVENOUS | Status: AC

## 2023-01-14 MED ORDER — SODIUM CHLORIDE 0.9% FLUSH: 3.0000 mL | Freq: Two times a day (BID) | INTRAVENOUS | Status: DC

## 2023-01-14 MED ORDER — FENTANYL CITRATE (PF) 100 MCG/2ML IJ SOLN
INTRAMUSCULAR | Status: DC | PRN
  Administered 2023-01-14: 25 ug via INTRAVENOUS

## 2023-01-14 MED ORDER — LABETALOL HCL 5 MG/ML IV SOLN: 10.0000 mg | INTRAVENOUS | Status: DC | PRN

## 2023-01-14 MED ORDER — SODIUM CHLORIDE 0.9% FLUSH: 3.0000 mL | INTRAVENOUS | Status: DC | PRN

## 2023-01-14 MED ORDER — SODIUM CHLORIDE 0.9 % IV SOLN: 250.0000 mL | INTRAVENOUS | Status: DC | PRN

## 2023-01-14 MED ORDER — SODIUM CHLORIDE 0.9 % WEIGHT BASED INFUSION: 1.0000 mL/kg/h | INTRAVENOUS | Status: DC

## 2023-01-14 MED ORDER — SODIUM CHLORIDE 0.9 % WEIGHT BASED INFUSION
3.0000 mL/kg/h | INTRAVENOUS | Status: AC
  Administered 2023-01-14: 3 mL/kg/h via INTRAVENOUS

## 2023-01-14 MED ORDER — HYDRALAZINE HCL 20 MG/ML IJ SOLN: 10.0000 mg | INTRAMUSCULAR | Status: DC | PRN

## 2023-01-14 MED ORDER — RIVAROXABAN 20 MG PO TABS: 20.0000 mg | ORAL_TABLET | Freq: Every day | ORAL | Status: AC

## 2023-01-14 MED ORDER — LIDOCAINE HCL (PF) 1 % IJ SOLN
INTRAMUSCULAR | Status: AC
  Filled 2023-01-14: qty 30

## 2023-01-14 MED ORDER — HEPARIN (PORCINE) IN NACL 1000-0.9 UT/500ML-% IV SOLN
INTRAVENOUS | Status: DC | PRN
  Administered 2023-01-14 (×2): 500 mL

## 2023-01-14 MED ORDER — MIDAZOLAM HCL 2 MG/2ML IJ SOLN
INTRAMUSCULAR | Status: AC
  Filled 2023-01-14: qty 2

## 2023-01-14 MED ORDER — FENTANYL CITRATE (PF) 100 MCG/2ML IJ SOLN
INTRAMUSCULAR | Status: AC
  Filled 2023-01-14: qty 2

## 2023-01-14 MED ORDER — LIDOCAINE HCL (PF) 1 % IJ SOLN
INTRAMUSCULAR | Status: DC | PRN
  Administered 2023-01-14: 15 mL

## 2023-01-14 MED ORDER — ASPIRIN 81 MG PO CHEW: 81.0000 mg | CHEWABLE_TABLET | ORAL | Status: DC

## 2023-01-14 MED ORDER — IOHEXOL 350 MG/ML SOLN
INTRAVENOUS | Status: DC | PRN
  Administered 2023-01-14: 45 mL via INTRA_ARTERIAL

## 2023-01-14 MED ORDER — ACETAMINOPHEN 325 MG PO TABS: 650.0000 mg | ORAL_TABLET | ORAL | Status: DC | PRN

## 2023-01-14 SURGICAL SUPPLY — 11 items
CATH INFINITI 5FR MULTPACK ANG (CATHETERS) IMPLANT
CLOSURE MYNX CONTROL 5F (Vascular Products) IMPLANT
ELECT DEFIB PAD ADLT CADENCE (PAD) IMPLANT
KIT HEART LEFT (KITS) ×1 IMPLANT
KIT MICROPUNCTURE NIT STIFF (SHEATH) IMPLANT
PACK CARDIAC CATHETERIZATION (CUSTOM PROCEDURE TRAY) ×1 IMPLANT
SHEATH PINNACLE 5F 10CM (SHEATH) IMPLANT
SHEATH PROBE COVER 6X72 (BAG) IMPLANT
TRANSDUCER W/STOPCOCK (MISCELLANEOUS) ×1 IMPLANT
TUBING CIL FLEX 10 FLL-RA (TUBING) ×1 IMPLANT
WIRE EMERALD 3MM-J .035X150CM (WIRE) IMPLANT

## 2023-01-14 NOTE — Progress Notes (Signed)
Client up and walked and tolerated well; right groin stable, no bleeding or hematoma 

## 2023-01-14 NOTE — Interval H&P Note (Signed)
Cath Lab Visit (complete for each Cath Lab visit)  Clinical Evaluation Leading to the Procedure:   ACS: No.  Non-ACS:    Anginal Classification: CCS III  Anti-ischemic medical therapy: Minimal Therapy (1 class of medications)  Non-Invasive Test Results: No non-invasive testing performed  Prior CABG: Previous CABG      History and Physical Interval Note:  01/14/2023 7:36 AM  [name] [name]  has presented today for surgery, with the diagnosis of creshendo angina.  The various methods of treatment have been discussed with the patient and family. After consideration of risks, benefits and other options for treatment, the patient has consented to  Procedure(s): LEFT HEART CATH AND CORS/GRAFTS ANGIOGRAPHY (N/A) as a surgical intervention.  The patient's history has been reviewed, patient examined, no change in status, stable for surgery.  I have reviewed the patient's chart and labs.  Questions were answered to the patient's satisfaction.     Lance Muss

## 2023-01-15 ENCOUNTER — Encounter (HOSPITAL_COMMUNITY): Payer: Self-pay | Admitting: Interventional Cardiology

## 2023-01-15 NOTE — Progress Notes (Signed)
Remote pacemaker transmission.   

## 2023-01-16 ENCOUNTER — Ambulatory Visit (HOSPITAL_COMMUNITY): Admit: 2023-01-16 | Payer: Medicare Other | Admitting: Internal Medicine

## 2023-01-16 ENCOUNTER — Encounter (HOSPITAL_COMMUNITY): Payer: Self-pay

## 2023-01-16 SURGERY — COLONOSCOPY WITH PROPOFOL
Anesthesia: Monitor Anesthesia Care

## 2023-03-09 IMAGING — CT CT CHEST W/O CM
2 of 5 series · 15 of 36 positions shown, 18 images · non-contrast
Comparison: 01/11/2021

CLINICAL DATA: Follow-up right lower lobe nodule. Shortness of
breath and right-sided rib pain.

EXAM:
CT CHEST WITHOUT CONTRAST
TECHNIQUE: Multidetector CT imaging of the chest was performed following the
standard protocol without IV contrast.

[Series 4: chest 2.00 br40 s3 · coronal · 0.64mm/px · 3 of 183 slices shown]
[im 37/183  lung]
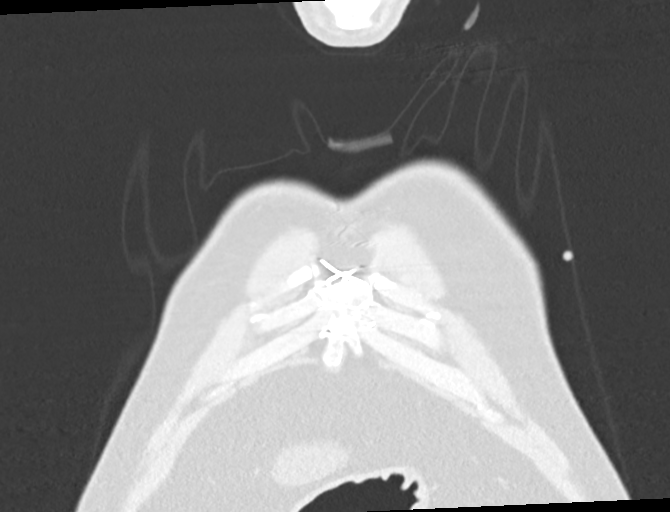
[im 73/183  lung]
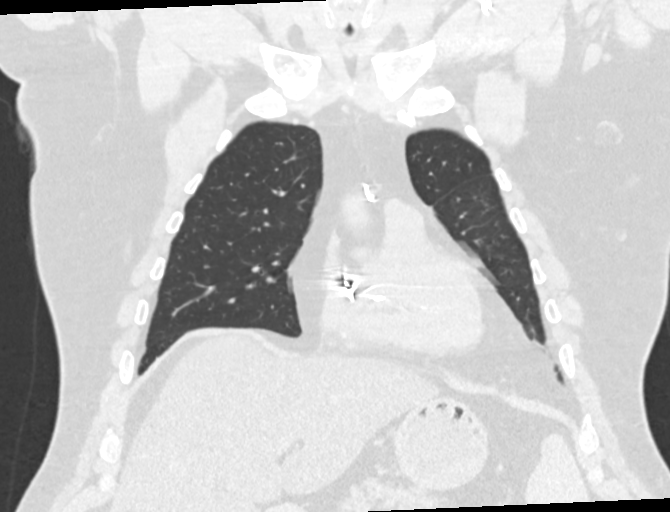
[im 110/183  lung]
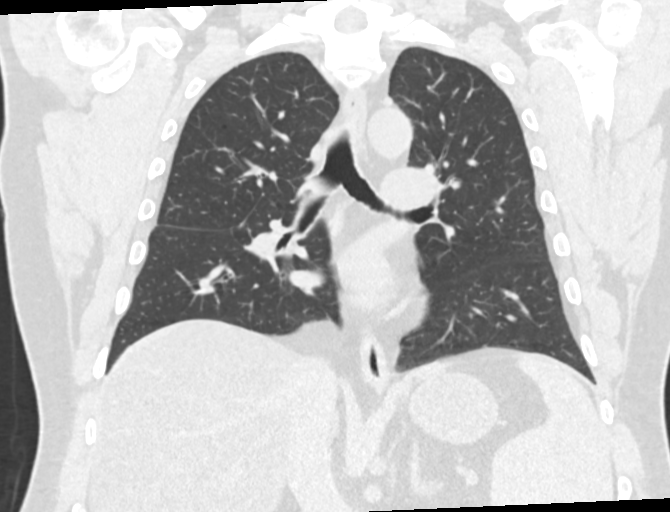

[Series 10: chest 1.00 br40 s3 super d · axial · 0.84mm/px · z∈[+1687,+1970]mm · 12 of 410 slices shown, 15 images]
[im 28/410  mediastinal]
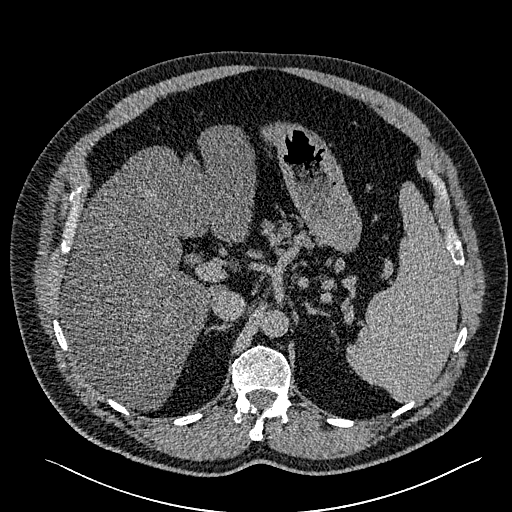
[im 28/410  lung]
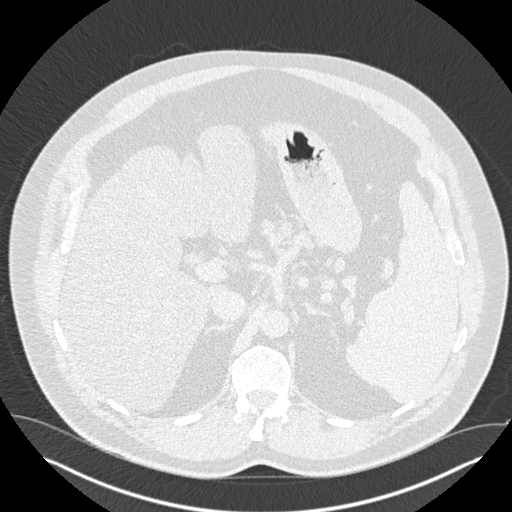
[im 55/410  lung]
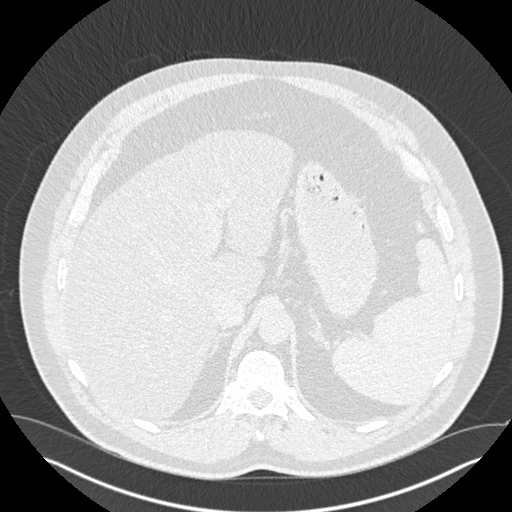
[im 82/410  lung]
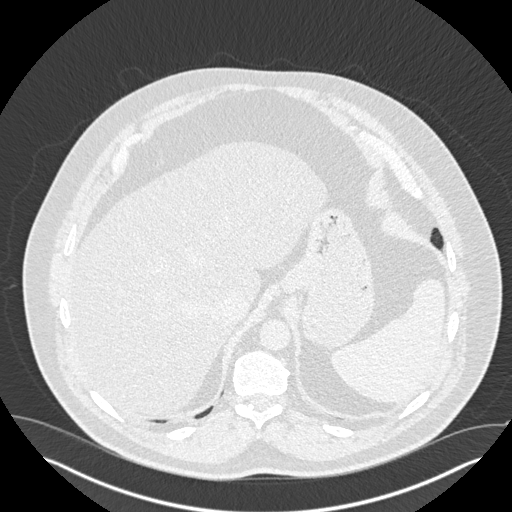
[im 137/410  lung]
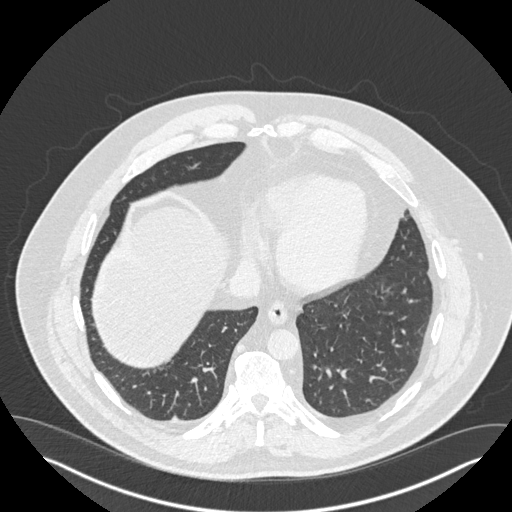
[im 164/410  mediastinal]
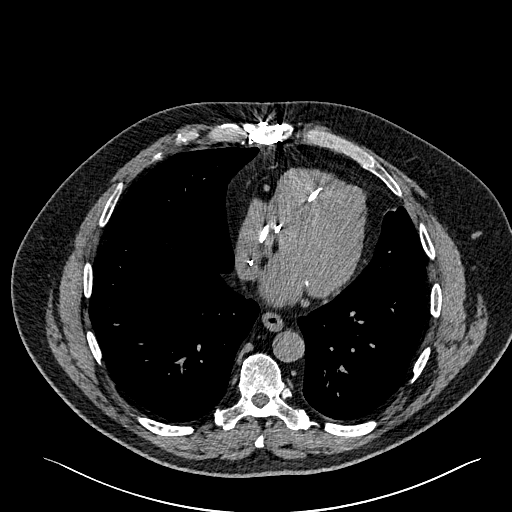
[im 164/410  lung]
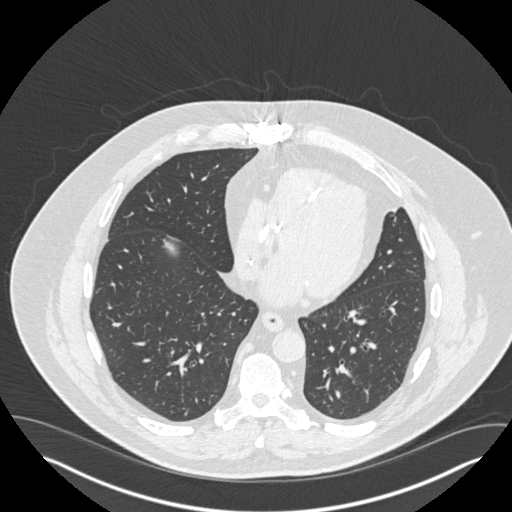
[im 191/410  lung]
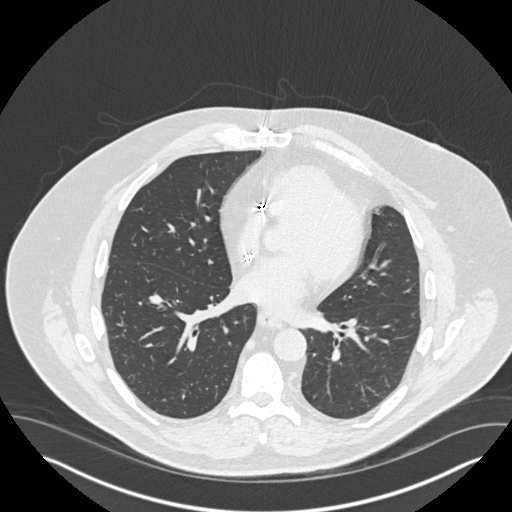
[im 219/410  lung]
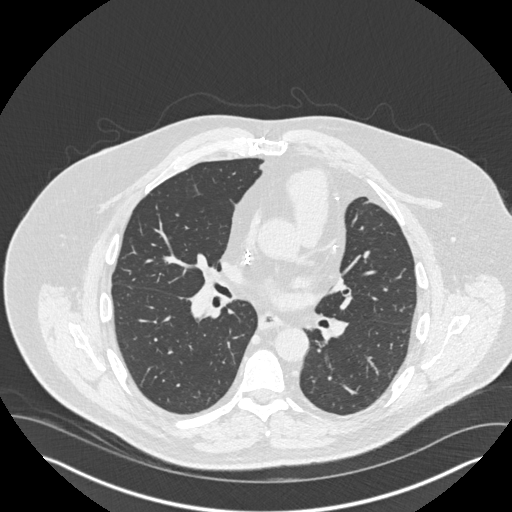
[im 246/410  lung]
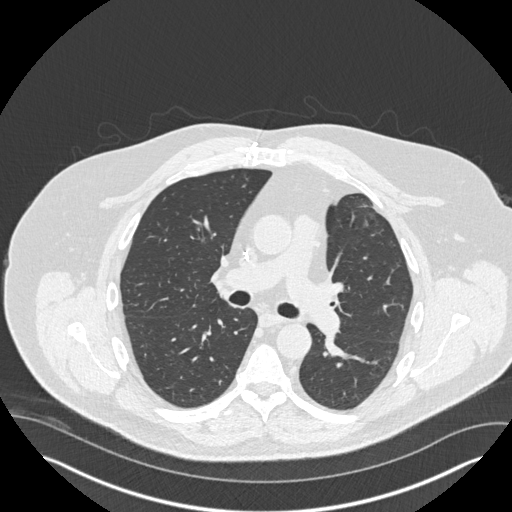
[im 273/410  mediastinal]
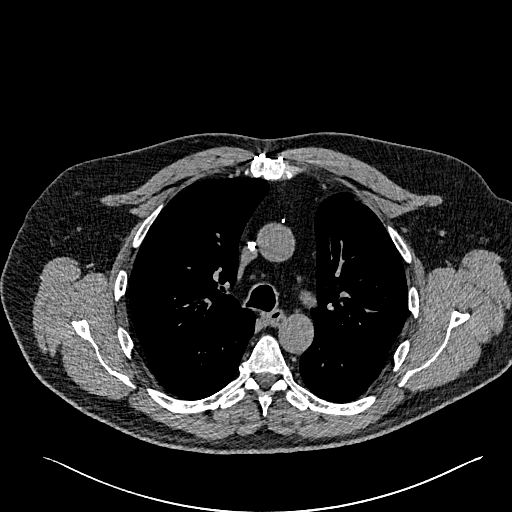
[im 273/410  lung]
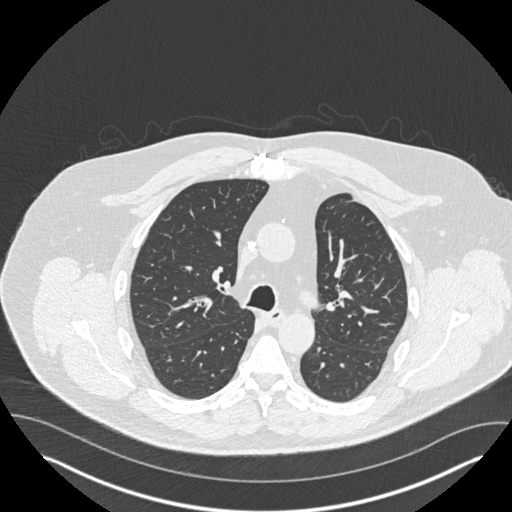
[im 328/410  lung]
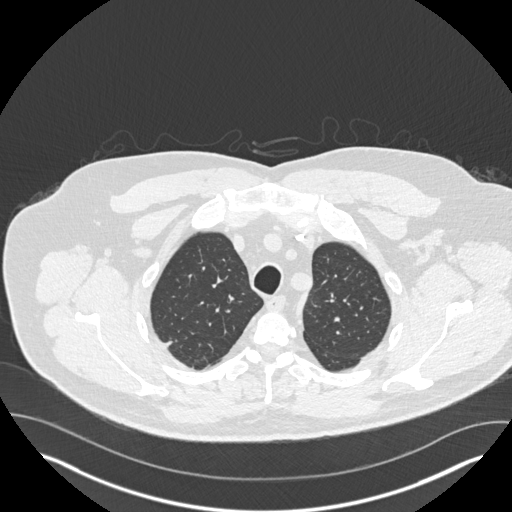
[im 355/410  lung]
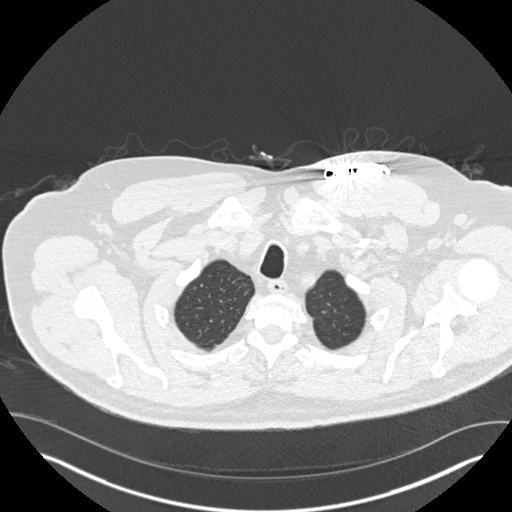
[im 382/410  lung]
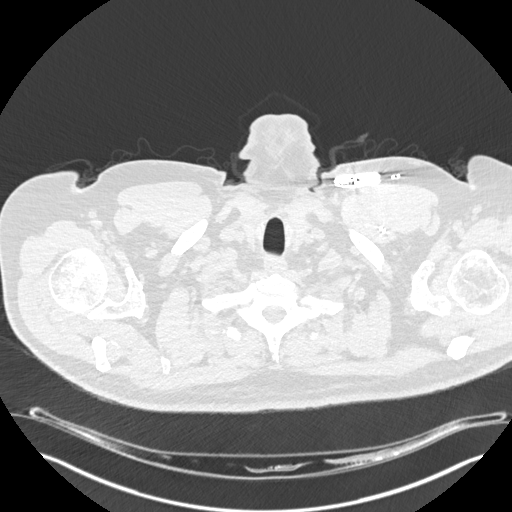

[15 of 36 positions shown; findings below may reference images not displayed]

FINDINGS: Cardiovascular: Previous median sternotomy and CABG. Pacemaker in
place with leads in the region of the right atrium and right
ventricle. Heart size is normal. No pericardial fluid. No aortic
atherosclerotic calcification is seen.

Mediastinum/Nodes: No mass or adenopathy.

Lungs/Pleura: No pleural effusion. Scattered subpleural pulmonary
nodules in both lungs are unchanged. The largest in the right lower
lobe measures 5 mm, without growth or worrisome change compared to
the previous study.

Upper Abdomen: Fatty change of the liver. No focal upper abdominal
finding otherwise.

Musculoskeletal: Negative
IMPRESSION: No change since the study 01/11/2021.

Previous CABG without complicating feature. Dual lead pacemaker in
place.

Stable tiny subpleural nodules in both lungs likely to represent
subpleural lymph nodes. 5 mm lateral right lower lobe nodule axial
image 98 of series 8 on today's study does not show any enlargement
or worrisome change. No follow-up needed if patient is low-risk (and
has no known or suspected primary neoplasm). Non-contrast chest CT
can be considered in 12 months if patient is high-risk. This
recommendation follows the consensus statement: Guidelines for
Management of Incidental Pulmonary Nodules Detected on CT Images:

## 2023-03-24 ENCOUNTER — Ambulatory Visit (INDEPENDENT_AMBULATORY_CARE_PROVIDER_SITE_OTHER): Payer: Medicare Other

## 2023-03-24 DIAGNOSIS — I442 Atrioventricular block, complete: Secondary | ICD-10-CM | POA: Diagnosis not present

## 2023-03-24 LAB — CUP PACEART REMOTE DEVICE CHECK
Battery Remaining Longevity: 23 mo
Battery Voltage: 2.93 V
Brady Statistic AP VP Percent: 21.73 %
Brady Statistic AP VS Percent: 0 %
Brady Statistic AS VP Percent: 78.22 %
Brady Statistic AS VS Percent: 0.05 %
Brady Statistic RA Percent Paced: 21.66 %
Brady Statistic RV Percent Paced: 99.92 %
Date Time Interrogation Session: 20240708124418
Implantable Lead Connection Status: 753985
Implantable Lead Connection Status: 753985
Implantable Lead Implant Date: 20161228
Implantable Lead Implant Date: 20161228
Implantable Lead Location: 753859
Implantable Lead Location: 753860
Implantable Lead Model: 5076
Implantable Lead Model: 5076
Implantable Pulse Generator Implant Date: 20161228
Lead Channel Impedance Value: 342 Ohm
Lead Channel Impedance Value: 380 Ohm
Lead Channel Impedance Value: 399 Ohm
Lead Channel Impedance Value: 437 Ohm
Lead Channel Pacing Threshold Amplitude: 0.875 V
Lead Channel Pacing Threshold Amplitude: 1.125 V
Lead Channel Pacing Threshold Pulse Width: 0.4 ms
Lead Channel Pacing Threshold Pulse Width: 0.4 ms
Lead Channel Sensing Intrinsic Amplitude: 1.125 mV
Lead Channel Sensing Intrinsic Amplitude: 1.125 mV
Lead Channel Sensing Intrinsic Amplitude: 12.75 mV
Lead Channel Sensing Intrinsic Amplitude: 12.75 mV
Lead Channel Setting Pacing Amplitude: 2.5 V
Lead Channel Setting Pacing Amplitude: 2.5 V
Lead Channel Setting Pacing Pulse Width: 0.4 ms
Lead Channel Setting Sensing Sensitivity: 2.8 mV
Zone Setting Status: 755011
Zone Setting Status: 755011

## 2023-04-08 NOTE — Progress Notes (Signed)
Remote pacemaker transmission.   

## 2023-05-14 ENCOUNTER — Telehealth: Payer: Self-pay | Admitting: *Deleted

## 2023-05-14 NOTE — Telephone Encounter (Signed)
Received VM wanting to reschedule his procedure. He will need OV prior. Thanks!

## 2023-05-15 NOTE — Telephone Encounter (Signed)
Pt called back and has been scheduled.

## 2023-05-15 NOTE — Telephone Encounter (Signed)
Pt informed that he will need an OV prior to scheduling his procedure. Pt was transferred to front desk to schedule OV.

## 2023-05-24 NOTE — Progress Notes (Addendum)
Referring Provider: Wilfrid Lund, PA Primary Care Physician:  Wilfrid Lund, PA Primary GI Physician: Dr. Jena Gauss  Chief Complaint  Patient presents with   Colonoscopy    Colonoscopy screening. Feels like a air bubble is in the middle of chest sometimes. Sometime has a sharp pain on the upper right sides.    HPI:   [name] [name] is a 67 y.o. male with history of CAD, MI, CABG, CHF, heart block s/p pacemaker, PAF on Xarelto, HTN, HLD, bipolar disorder, presenting today for follow-up.  Last seen in the office at the time of initial consult 10/30/2022 for further evaluation of abdominal pain and nausea.  Also discussed fatty liver and need for colon cancer screening.  He reported intermittent RUQ abdominal pain, nausea without vomiting triggered by coffee with differentials including GERD, gastritis, duodenitis, PUD, biliary colic.  He denied NSAIDs.  PCP had recently increased omeprazole to 40 mg daily and patient was waiting on prescription.  Recommended EGD for further evaluation.  If unrevealing and symptoms continue, consider referral to general surgery as he has known cholelithiasis.  He also reported atypical chest pain described as a "bubble" in his mid chest after drinking coffee or soda.  Suspected this was related to GERD and advised to proceed with starting omeprazole 40 mg daily.  Regarding fatty liver, this has been noted on prior CT imaging along with splenomegaly.  Recommended updating ultrasound with elastography and also requesting recent blood work from PCP for review.  He was also due for screening colonoscopy and planned to schedule this with EGD.  Received labs from PCP dated 10/22/2022. AST 66, ALT 64. Alk phos within normal limits at 60. Total bilirubin slightly elevated at 1.3. Creatinine elevated at 1.31. Hepatitis C antibody nonreactive.  Per PCP note, liver enzymes were slightly more elevated than previously.  Recommended completing additional labs to rule out viral  hepatitis, autoimmune hepatitis, hemochromatosis, and fractionate bilirubin.   Last completed 11/13/2022:  Bilirubin normalized  Iron panel within normal limits.  Autoimmune labs negative.  Hep B labs negative with no immunity.  In the interim, patient saw cardiology due to exertional chest pain with recommendations for left heart cath, so EGD and colonoscopy were canceled. Left heart cath 01/14/2023 that showed patent grafts, patent native circumflex, RCA, and ramus.  Recommended continuing medical therapy.  Today: Omeprazole 40 mg daily has helped, but continues with the same symptoms.  RUQ abdominal pain, comes and goes. Not specifically related to meals.  Intermittent nausea with occasional vomiting. Not specifically related to RUQ pain.  Bubble in his chest all the time. Worse with coffee/soda.  Had an episode of heartburn the other night, but doesn't happen regularly.  No weight loss.   Diarrhea for the last 3 months. No nocturnal stools. About 3 Bms a day. Sometimes stool is black. Maybe once this week. Started about 1 month ago. No bright red blood. No iron or pepto bismol. Used to be constipated. He is taking magnesium and potassium. No other recent medication changes.  No recent antibiotics. Stools are watery to mushy.   No NSAIDs.   ETOH: None.   Past Medical History:  Diagnosis Date   ALLERGIC RHINITIS 05/14/2010   Qualifier: Diagnosis of  By: Garnette Czech PA, Dawn     Asthma    Asthma    Atrial fibrillation [facility])    new November 2016   Bipolar 1 disorder [facility])    "put me on this after I got out  of drug rehab 11/18/2005 and couldn't sleep; added anxiety RX and have been sleeping fine since"   Bradycardia    Very limited Wenkebach on event recorder November 2010   CAD (coronary artery disease) 2006   3v CAD >> s/p CABG in [place], patent LIMA to LAD and SVG to diagonal with 40% stenosis 2010 // Nuc 2/14: low risk, EF 54 // LHC 3/16: patent L-LAD, patent S-Dx // Echo 3/16: mild  conc LVH, EF 60-65, Gr 1 DD, trivial TR, mild LAE // LHC 12/19: oLAD 100, pLCx 20, S-D1 patent, L-LAD patent, EF 45   Cardiac pacemaker in situ 08/18/2018   CHB (complete heart block) (HCC) 12/02/2014   Chest pain with moderate risk of acute coronary syndrome    Chronic systolic CHF (congestive heart failure) (HCC) 09/15/2018   EF 45 by LHC in 12/19 // Echo 09/2018: inf HK, EF 45-50, mod LAE   Dyslipidemia    mixed    ED (erectile dysfunction)    Ejection fraction    EF 50%, catheterization, September, 2010   //   EF 60-65%, echo, December 03, 2014    Essential hypertension    GERD (gastroesophageal reflux disease)    Gout    History of stomach ulcers 1980's   Hx of CABG    2006, [place]    Memory loss-SVD on MRI March 2015 11/18/2013   Mixed hyperlipidemia    pt states "they took me off q med they had me on for high cholesterol; my good cholesterol is low" (09/13/2015)   Myocardial infarction (HCC) 2006   OSA (obstructive sleep apnea)    patient denies, stated had a sleep study and he was told he did not have     PAF (paroxysmal atrial fibrillation) (HCC) 08/18/2018   Palpitations    Event recorder November, 2010, no significant arrhythmias    Pneumonia 2015   Pneumonia 1990's X 4 in one year   "double pneumonia"   Presence of permanent cardiac pacemaker    Second degree AV block 09/13/2015   Second degree AV block, Mobitz type I 03/10/2013   Patient had Mobitz 1 heart block that was asymptomatic. This resolved off beta blockade while in the hospital March, 2016. Plan to keep the patient off beta blockers. Patient will be seen in follow-up by Dr. Ladona Ridgel and the [place] office.    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[facility])     Past Surgical History:  Procedure Laterality Date   BACK SURGERY     CARDIAC CATHETERIZATION     "I've had quite a few; don't remember any stents" (09/13/2015)   CORONARY ANGIOPLASTY     CORONARY ARTERY BYPASS GRAFT  2006   "CABG X2, in [place]"   EP  IMPLANTABLE DEVICE N/A 09/13/2015   Procedure: Pacemaker Implant;  Surgeon: Hillis Range, MD;  Location: [facility] INVASIVE CV LAB;  Service: Cardiovascular;  Laterality: N/A;   INSERT / REPLACE / REMOVE PACEMAKER  09/13/2015   [employer]   LEFT HEART CATH AND CORS/GRAFTS ANGIOGRAPHY N/A 08/26/2018   Procedure: LEFT HEART CATH AND CORS/GRAFTS ANGIOGRAPHY;  Surgeon: Lennette Bihari, MD;  Location: [facility] INVASIVE CV LAB;  Service: Cardiovascular;  Laterality: N/A;   LEFT HEART CATH AND CORS/GRAFTS ANGIOGRAPHY N/A 01/14/2023   Procedure: LEFT HEART CATH AND CORS/GRAFTS ANGIOGRAPHY;  Surgeon: Corky Crafts, MD;  Location[facility] INVASIVE CV LAB;  Service: Cardiovascular;  Laterality: N/A;   LEFT HEART CATHETERIZATION WITH CORONARY/GRAFT ANGIOGRAM N/A 12/05/2014   Procedure: LEFT HEART CATHETERIZATION WITH CORONARY/GRAFT ANGIOGRAM;  Surgeon: Peter M Swaziland, MD;  Location: [facility] CATH LAB;  Service: Cardiovascular;  Laterality: N/A;   LEG SURGERY Left 1961   "had 5 boils come up on my leg; they had to cut them out and put a drainage tube in there"   LUMBAR DISC SURGERY  X 3   "ruptured disc"   MAXIMUM ACCESS (MAS)POSTERIOR LUMBAR INTERBODY FUSION (PLIF) 1 LEVEL  ~ 2003   "L4-5; put rods & screws in"    Current Outpatient Medications  Medication Sig Dispense Refill   allopurinol (ZYLOPRIM) 300 MG tablet Take 300 mg by mouth every evening.     colchicine 0.6 MG tablet Take 0.6 mg by mouth daily as needed (Gout).  0   fluticasone (FLONASE) 50 MCG/ACT nasal spray Place 2 sprays into both nostrils daily.     loratadine (CLARITIN) 10 MG tablet Take 10 mg by mouth daily.     Magnesium 300 MG CAPS Take 300 mg by mouth daily.     Melatonin 10 MG SUBL 1 tablet under the tongue and allow to dissolve at bedtime as needed Sublingual at bedtime     metoprolol succinate (TOPROL-XL) 25 MG 24 hr tablet TAKE 1 TABLET BY MOUTH EVERY DAY. MAY TAKE 1/2 TO 1 TABLET EXTRA FOR PALITATTIONS 60 tablet 11   nitroGLYCERIN (NITROLINGUAL)  0.4 MG/SPRAY spray USE 1 SPRAY UNDER THE TONGUE EVERY 5 MINUTES AS NEEDED FOR CHEST PAIN 12 g 3   omeprazole (PRILOSEC) 40 MG capsule Take 1 capsule (40 mg total) by mouth daily. 90 capsule 3   ondansetron (ZOFRAN) 4 MG tablet Take 1 tablet (4 mg total) by mouth every 8 (eight) hours as needed for nausea or vomiting. 30 tablet 1   POTASSIUM PO Take 1 tablet by mouth daily.     QUEtiapine (SEROQUEL) 300 MG tablet Take 300 mg by mouth at bedtime.     rivaroxaban (XARELTO) 20 MG TABS tablet Take 1 tablet (20 mg total) by mouth daily with supper. 30 tablet    rosuvastatin (CRESTOR) 10 MG tablet TAKE 1 TABLET BY MOUTH EVERY DAY 30 tablet 0   silodosin (RAPAFLO) 8 MG CAPS capsule TAKE 1 CAPSULE (8 MG TOTAL) BY MOUTH AT BEDTIME. 90 capsule 3   temazepam (RESTORIL) 30 MG capsule Take 30 mg by mouth at bedtime.   5   valsartan-hydrochlorothiazide (DIOVAN-HCT) 320-25 MG tablet Take 1 tablet by mouth daily.     No current facility-administered medications for this visit.    Allergies as of 05/26/2023 - Review Complete 05/26/2023  Allergen Reaction Noted   Sulfonamide derivatives Hives    Zolpidem Hives 10/30/2022   Zolpidem tartrate Other (See Comments) 05/04/2021    Family History  Problem Relation Age of Onset   COPD Mother    Lung cancer Mother    Stroke Mother    Heart attack Mother    Heart disease Father    Heart attack Father    Hyperlipidemia Brother    Hypertension Brother    Hypertension Brother    Hypertension Sister     Social History   Socioeconomic History   Marital status: Married    Spouse name: Not on file   Number of children: Not on file   Years of education: Not on file   Highest education level: Not on file  Occupational History   Not on file  Tobacco Use   Smoking status: Former    Current packs/day: 0.00    Average packs/day: 2.0 packs/day for 17.0 years (  34.0 ttl pk-yrs)    Types: Cigarettes    Start date: 03/13/2000    Quit date: 03/13/2017    Years  since quitting: 6.2   Smokeless tobacco: Current    Types: Snuff   Tobacco comments:    09/13/2015 "quit chewing tobacco August 2016"  Vaping Use   Vaping status: Never Used  Substance and Sexual Activity   Alcohol use: Not Currently    Comment: 09/13/2015 "got out of alcohol rehab 11/18/2005"   Drug use: Not Currently    Comment: 09/13/2015 "got out of rehab for pills 11/18/2005"   Sexual activity: Yes    Birth control/protection: None  Other Topics Concern   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    Social Determinants of Health   Financial Resource Strain: Not on file  Food Insecurity: Not on file  Transportation Needs: Not on file  Physical Activity: Not on file  Stress: Not on file  Social Connections: Not on file    Review of Systems: Gen: Denies fever, chills, cold or flulike symptoms, presyncope, syncope. CV: Admits to chronic, intermittent chest pain.  No palpitations. Resp: Denies dyspnea, cough GI: See HPI Heme: See HPI  Physical Exam: BP 134/87 (BP Location: Right Arm, Patient Position: Sitting, Cuff Size: Large)   Pulse 67   Temp 98.1 F (36.7 C) (Temporal)   Ht 5\' 8"  (1.727 m)   Wt 233 lb 6.4 oz (105.9 kg)   SpO2 98%   BMI 35.49 kg/m  General:   Alert and oriented. No distress noted. Pleasant and cooperative.  Head:  Normocephalic and atraumatic. Eyes:  Conjuctiva clear without scleral icterus. Heart:  S1, S2 present without murmurs appreciated. Lungs:  Clear to auscultation bilaterally. No wheezes, rales, or rhonchi. No distress.  Abdomen:  +BS, soft, non-tender and non-distended. No rebound or guarding. No HSM or masses noted. Msk:  Symmetrical without gross deformities. Normal posture. Extremities:  Without edema. Neurologic:  Alert and  oriented x4 Psych:  Normal mood and affect.    Assessment:  67 y.o. male with history of CAD, MI, CABG, CHF, heart block s/p pacemaker, PAF on Xarelto,  HTN, HLD, bipolar disorder, presenting today for follow-up of fatty liver/elevated LFTs, RUQ abdominal pain, nausea, atypical chest pain, and also reporting intermittent RLQ pain and change in bowel habits with diarrhea.  Fatty liver/Elevated LFTs: Previously noted fatty infiltration of the liver and splenomegaly on prior CT imaging.  Ultrasound abdomen with elastography 11/05/2022 with fatty liver, median K PA 3.3.  He does have mildly elevated LFTs likely secondary to fatty liver.  Additional workup has been negative for hepatitis B and C, autoimmune hepatitis, and hemochromatosis.  He was counseled on fatty liver today with the importance of weight loss through diet and exercise.  RUQ abdominal pain/nausea with vomiting:  Intermittent RUQ abdominal pain as well as intermittent nausea without vomiting, though these symptoms do not always occur together, possibly secondary to GERD, gastritis, duodenitis, PUD, H. pylori. Symptoms seem less consistent with biliary colic, but unable to rule this out as he has known cholelithiasis. He has noted some improvement with omeprazole 40 mg daily, but still with fairly frequent symptoms yet not necessarily triggered by meals. No unintentional weight loss. Notably, he is now reporting intermittent black stool for the last month. Denies NSAIDs.  Previously scheduled for EGD, but this was canceled due to chest pain.  He has now been evaluated by cardiology and has  been found to have patent grafts on left heart cath and was advised to continue medical therapy.  We will reschedule EGD for further evaluation.  I will also increase omeprazole to 40 mg twice daily.  If EGD unrevealing and symptoms persist, can consider referral to general surgeon for cholecystectomy.  RLQ pain:  Reports intermittent RLQ abdominal pain.  Moderate TTP in RLQ along with RUQ region on exam today.  Will order CT A/P with contrast and update labs for further evaluation.  Need to rule out  cholecystitis, appendicitis, as well as colitis in the setting of diarrhea as per below.   Diarrhea/change in bowel habits: Previously with constipation, now with 3 loose to watery bowel movements daily.  No nocturnal stools.  Denies medication changes aside from magnesium and potassium which could certainly be contributing to his diarrhea, but considering significant change, we will update stool studies, check thyroid function, screen for celiac disease. I am also ordering CT to rule out colitis considering right sided tenderness to palpation.  Additionally, he is due for screening colonoscopy and will get this scheduled in the near future.   Colon cancer screening: Due for screening colonoscopy.  Thinks last colonoscopy was at age 60 without polyps.  No colonoscopy records on file.    Plan:  CBC, CMP, TSH, IgA, TTG IgA, C. difficile toxin A/B, GI profile panel. CT A/P with contrast ASAP Proceed with upper endoscopy + colonoscopy with propofol by Dr. Jena Gauss in near future. The risks, benefits, and alternatives have been discussed with the patient in detail. The patient states understanding and desires to proceed.  ASA  We will get medical clearance from cardiology and the approval to hold Xarelto x 48 hours prior to procedures. Increase omeprazole to 40 mg twice daily.  New prescription sent to pharmacy. Continue Zofran 4 mg as needed.  Refill sent to pharmacy. Instructions for fatty liver: Recommend 1-2# weight loss per week until ideal body weight through exercise & diet. Low fat/cholesterol diet.   Avoid sweets, sodas, fruit juices, sweetened beverages like tea, etc. Gradually increase exercise from 15 min daily up to 1 hr per day 5 days/week. Follow-up after procedures.   Ermalinda Memos, PA-C [facility] 05/26/2023

## 2023-05-26 ENCOUNTER — Telehealth: Payer: Self-pay | Admitting: *Deleted

## 2023-05-26 ENCOUNTER — Ambulatory Visit (INDEPENDENT_AMBULATORY_CARE_PROVIDER_SITE_OTHER): Payer: Medicare HMO | Admitting: Gastroenterology

## 2023-05-26 ENCOUNTER — Encounter: Payer: Self-pay | Admitting: Gastroenterology

## 2023-05-26 VITALS — BP 134/87 | HR 67 | Temp 98.1°F | Ht 68.0 in | Wt 233.4 lb

## 2023-05-26 DIAGNOSIS — R112 Nausea with vomiting, unspecified: Secondary | ICD-10-CM

## 2023-05-26 DIAGNOSIS — R197 Diarrhea, unspecified: Secondary | ICD-10-CM | POA: Diagnosis not present

## 2023-05-26 DIAGNOSIS — R1031 Right lower quadrant pain: Secondary | ICD-10-CM | POA: Diagnosis not present

## 2023-05-26 DIAGNOSIS — R7989 Other specified abnormal findings of blood chemistry: Secondary | ICD-10-CM

## 2023-05-26 DIAGNOSIS — R194 Change in bowel habit: Secondary | ICD-10-CM

## 2023-05-26 DIAGNOSIS — R1011 Right upper quadrant pain: Secondary | ICD-10-CM | POA: Diagnosis not present

## 2023-05-26 DIAGNOSIS — K760 Fatty (change of) liver, not elsewhere classified: Secondary | ICD-10-CM

## 2023-05-26 MED ORDER — OMEPRAZOLE 40 MG PO CPDR
40.0000 mg | DELAYED_RELEASE_CAPSULE | Freq: Every day | ORAL | 3 refills | Status: AC
Start: 2023-05-26 — End: ?

## 2023-05-26 MED ORDER — ONDANSETRON HCL 4 MG PO TABS
4.0000 mg | ORAL_TABLET | Freq: Three times a day (TID) | ORAL | 1 refills | Status: AC | PRN
Start: 2023-05-26 — End: ?

## 2023-05-26 NOTE — Telephone Encounter (Signed)
  Request for patient to stop medication prior to procedure or is needing cleareance  05/26/23  [name] [name] [birth year]  What type of surgery is being performed? Colonoscopy/Esophagogastroduodenoscopy (EGD)  When is surgery scheduled? TBD  What type of clearance is required (medical or pharmacy to hold medication or both? both  Patient is needing to have medical clearance along with clearance to hold Xarelto x 2 days prior to procedures.  Name of physician performing surgery?  Dr[facility] at [employer]: [phone] Fax: [phone]  Anethesia type (none, local, MAC, general)? MAC

## 2023-05-26 NOTE — Telephone Encounter (Signed)
LMOVM to return call   CT scheduled at [facility] for Thursday, 05/29/23, arrive at 3:15 pm to check in, NPO 4 hours prior.

## 2023-05-26 NOTE — Patient Instructions (Addendum)
Please have labs and stool studies completed at [facility].  We will arrange for you to have a CT of your abdomen and pelvis at [facility].  We will get your colonoscopy and upper endoscopy rescheduled with Dr. Jena Gauss.  Please increase omeprazole to 40 mg twice daily 30 minutes before breakfast and dinner.  I am sending an updated prescription to your pharmacy.  Continue Zofran 4 mg as needed.  I am refilling this for you.  Instructions for fatty liver: Recommend 1-2# weight loss per week until ideal body weight through exercise & diet. Low fat/cholesterol diet.   Avoid sweets, sodas, fruit juices, sweetened beverages like tea, etc. Gradually increase exercise from 15 min daily up to 1 hr per day 5 days/week.    I will see back in the office after your procedures.  It was good to see you again today!  Ermalinda Memos, PA-C [facility]

## 2023-05-26 NOTE — Telephone Encounter (Signed)
Pharmacy please advise on holding Xarelto  prior to colonoscopy scheduled for tBD. Thank you.

## 2023-05-26 NOTE — Telephone Encounter (Signed)
[employer] PA for CT: Authorization Number: [id number] Case Number: [id number] Review Date: 05/26/2023 1:52:43 PM Expiration Date: 11/22/2023 Status: Your case has been Approved. The prior authorization you submitted, Case [id number], has been received. Additional case status notifications will be sent if you opted in for email notifications. Thank you.

## 2023-05-27 ENCOUNTER — Encounter: Payer: Self-pay | Admitting: *Deleted

## 2023-05-27 ENCOUNTER — Telehealth: Payer: Self-pay | Admitting: Internal Medicine

## 2023-05-27 ENCOUNTER — Telehealth: Payer: Self-pay

## 2023-05-27 NOTE — Telephone Encounter (Signed)
Patient returned your call... he left a message on voice mail

## 2023-05-27 NOTE — Telephone Encounter (Signed)
Pt is scheduled for tele for 06/11/23 at 2:20pm. Med rec and consent done

## 2023-05-27 NOTE — Telephone Encounter (Signed)
Patient with diagnosis of afib on Xarelto for anticoagulation.    Procedure: colonoscopy/EGD Date of procedure: TBD  CHA2DS2-VASc Score = 3  This indicates a 3.2% annual risk of stroke. The patient's score is based upon: CHF History: 1 HTN History: 1 Diabetes History: 0 Stroke History: 0 Vascular Disease History: 1 Age Score: 0 Gender Score: 0   CrCl 48mL/min using adjusted body weight Platelet count 231K  Per office protocol, patient can hold Xarelto for 2 days prior to procedure as requested.    **This guidance is not considered finalized until pre-operative APP has relayed final recommendations.**

## 2023-05-27 NOTE — Telephone Encounter (Signed)
See previous TE

## 2023-05-27 NOTE — Telephone Encounter (Signed)
   Name: [name] [name]  DOB: 01/01/1956  MRN: [id number]  Primary Cardiologist: Lewayne Bunting, MD   Preoperative team, please contact this patient and set up a phone call appointment for further preoperative risk assessment. Please obtain consent and complete medication review. Thank you for your help.  I confirm that guidance regarding antiplatelet and oral anticoagulation therapy has been completed and, if necessary, noted below.  Per office protocol, patient can hold Xarelto for 2 days prior to procedure as requested.       Joni Reining, NP 05/27/2023, 8:06 AM [facility]

## 2023-05-27 NOTE — Telephone Encounter (Signed)
Pt aware of appt details 

## 2023-05-27 NOTE — Telephone Encounter (Signed)
Pt is scheduled for tele for 06/11/23 at 2:20pm. Med rec and consent done    Patient Consent for Virtual Visit        [name] [name] has provided verbal consent on 05/27/2023 for a virtual visit (video or telephone).   CONSENT FOR VIRTUAL VISIT FOR:  [name] [name]  By participating in this virtual visit I agree to the following:  I hereby voluntarily request, consent and authorize [facility] and its employed or contracted physicians, physician assistants, nurse practitioners or other licensed health care professionals (the Practitioner), to provide me with telemedicine health care services (the "Services") as deemed necessary by the treating Practitioner. I acknowledge and consent to receive the Services by the Practitioner via telemedicine. I understand that the telemedicine visit will involve communicating with the Practitioner through live audiovisual communication technology and the disclosure of certain medical information by electronic transmission. I acknowledge that I have been given the opportunity to request an in-person assessment or other available alternative prior to the telemedicine visit and am voluntarily participating in the telemedicine visit.  I understand that I have the right to withhold or withdraw my consent to the use of telemedicine in the course of my care at any time, without affecting my right to future care or treatment, and that the Practitioner or I may terminate the telemedicine visit at any time. I understand that I have the right to inspect all information obtained and/or recorded in the course of the telemedicine visit and may receive copies of available information for a reasonable fee.  I understand that some of the potential risks of receiving the Services via telemedicine include:  Delay or interruption in medical evaluation due to technological equipment failure or disruption; Information transmitted may not be sufficient (e.g. poor  resolution of images) to allow for appropriate medical decision making by the Practitioner; and/or  In rare instances, security protocols could fail, causing a breach of personal health information.  Furthermore, I acknowledge that it is my responsibility to provide information about my medical history, conditions and care that is complete and accurate to the best of my ability. I acknowledge that Practitioner's advice, recommendations, and/or decision may be based on factors not within their control, such as incomplete or inaccurate data provided by me or distortions of diagnostic images or specimens that may result from electronic transmissions. I understand that the practice of medicine is not an exact science and that Practitioner makes no warranties or guarantees regarding treatment outcomes. I acknowledge that a copy of this consent can be made available to me via my patient portal [facility] MyChart), or I can request a printed copy by calling the office of [facility].    I understand that my insurance will be billed for this visit.   I have read or had this consent read to me. I understand the contents of this consent, which adequately explains the benefits and risks of the Services being provided via telemedicine.  I have been provided ample opportunity to ask questions regarding this consent and the Services and have had my questions answered to my satisfaction. I give my informed consent for the services to be provided through the use of telemedicine in my medical care

## 2023-05-28 LAB — CMP14+EGFR
ALT: 53 IU/L — ABNORMAL HIGH (ref 0–44)
AST: 40 IU/L (ref 0–40)
Albumin: 4.8 g/dL (ref 3.9–4.9)
Alkaline Phosphatase: 64 IU/L (ref 44–121)
BUN/Creatinine Ratio: 8 — ABNORMAL LOW (ref 10–24)
BUN: 11 mg/dL (ref 8–27)
Bilirubin Total: 0.4 mg/dL (ref 0.0–1.2)
CO2: 25 mmol/L (ref 20–29)
Calcium: 9.9 mg/dL (ref 8.6–10.2)
Chloride: 102 mmol/L (ref 96–106)
Creatinine, Ser: 1.4 mg/dL — ABNORMAL HIGH (ref 0.76–1.27)
Globulin, Total: 2.1 g/dL (ref 1.5–4.5)
Glucose: 115 mg/dL — ABNORMAL HIGH (ref 70–99)
Potassium: 5.3 mmol/L — ABNORMAL HIGH (ref 3.5–5.2)
Sodium: 142 mmol/L (ref 134–144)
Total Protein: 6.9 g/dL (ref 6.0–8.5)
eGFR: 56 mL/min/{1.73_m2} — ABNORMAL LOW (ref >59)

## 2023-05-28 LAB — CBC WITH DIFFERENTIAL/PLATELET
Basophils Absolute: 0.1 10*3/uL (ref 0.0–0.2)
Basos: 2 %
EOS (ABSOLUTE): 0.1 10*3/uL (ref 0.0–0.4)
Eos: 1 %
Hematocrit: 47.2 % (ref 37.5–51.0)
Hemoglobin: 15.8 g/dL (ref 13.0–17.7)
Immature Grans (Abs): 0 10*3/uL (ref 0.0–0.1)
Immature Granulocytes: 0 %
Lymphocytes Absolute: 1.2 10*3/uL (ref 0.7–3.1)
Lymphs: 21 %
MCH: 31.3 pg (ref 26.6–33.0)
MCHC: 33.5 g/dL (ref 31.5–35.7)
MCV: 94 fL (ref 79–97)
Monocytes Absolute: 0.3 10*3/uL (ref 0.1–0.9)
Monocytes: 6 %
Neutrophils Absolute: 3.9 10*3/uL (ref 1.4–7.0)
Neutrophils: 70 %
Platelets: 229 10*3/uL (ref 150–450)
RBC: 5.04 x10E6/uL (ref 4.14–5.80)
RDW: 13.4 % (ref 11.6–15.4)
WBC: 5.6 10*3/uL (ref 3.4–10.8)

## 2023-05-28 LAB — TISSUE TRANSGLUTAMINASE, IGA: Transglutaminase IgA: <2 U/mL (ref 0–3)

## 2023-05-28 LAB — TSH: TSH: 2.07 u[IU]/mL (ref 0.450–4.500)

## 2023-05-28 LAB — IGA: IgA/Immunoglobulin A, Serum: 100 mg/dL (ref 61–437)

## 2023-05-29 ENCOUNTER — Ambulatory Visit (HOSPITAL_COMMUNITY)
Admission: RE | Admit: 2023-05-29 | Discharge: 2023-05-29 | Disposition: A | Discharge status: Home / Self Care | Payer: Medicare HMO | Source: Ambulatory Visit | Attending: Gastroenterology | Admitting: Gastroenterology

## 2023-05-29 ENCOUNTER — Other Ambulatory Visit: Payer: Self-pay | Admitting: Gastroenterology

## 2023-05-29 DIAGNOSIS — R197 Diarrhea, unspecified: Secondary | ICD-10-CM | POA: Diagnosis present

## 2023-05-29 DIAGNOSIS — R1031 Right lower quadrant pain: Secondary | ICD-10-CM | POA: Insufficient documentation

## 2023-05-29 MED ORDER — IOHEXOL 300 MG/ML  SOLN
100.0000 mL | Freq: Once | INTRAMUSCULAR | Status: AC | PRN
  Administered 2023-05-29: 100 mL via INTRAVENOUS

## 2023-05-31 LAB — CLOSTRIDIUM DIFFICILE EIA: C difficile Toxins A+B, EIA: NEGATIVE

## 2023-06-01 LAB — GI PROFILE, STOOL, PCR

## 2023-06-11 ENCOUNTER — Ambulatory Visit: Payer: Medicare HMO

## 2023-06-11 ENCOUNTER — Encounter: Payer: Self-pay | Admitting: Physician Assistant

## 2023-06-11 ENCOUNTER — Telehealth: Payer: Self-pay | Admitting: Physician Assistant

## 2023-06-11 NOTE — Progress Notes (Addendum)
Opened in error. See other phone note from today.

## 2023-06-11 NOTE — Telephone Encounter (Signed)
Laurann Montana, PA-C15 minutes ago (2:29 PM)   Patient was on schedule for virtual visit at 2pm. However, went to call patient, and noted that he resides in state of [place]. After Covid pandemic settled down, state laws went back to previous restrictions where the patient must reside in the state the provider is licensed in. I also confirmed [place] state laws require the practitioner performing telemedicine be licensed in state of [place], which [employer] historically are not.   Therefore, needs in person OV. Please relay our apologies for these legal restrictions.   Regardless, given the last OV 12/2022 with chest pain and labs this year showing somewhat worsening renal function, this is probably for the best.   Will route to callback team to cancel the 2:20 VV and arrange for in person OV. OK to see general APP.

## 2023-06-11 NOTE — Telephone Encounter (Signed)
Patient is calling back to talk with Ronie Spies or nurse about visit

## 2023-06-11 NOTE — Telephone Encounter (Signed)
Patient scheduled to see Jari Favre, PA on 06/20/23 for clearance

## 2023-06-11 NOTE — Progress Notes (Signed)
See phone note. VV needs to be changed to in office visit.

## 2023-06-11 NOTE — Telephone Encounter (Addendum)
Patient was on schedule for virtual visit at 2pm. However, went to call patient, and noted that he resides in state of [place]. After Covid pandemic settled down, state laws went back to previous restrictions where the patient must reside in the state the provider is licensed in. I also confirmed [place] state laws require the practitioner performing telemedicine be licensed in state of [place], which [employer] historically are not.  Therefore, needs in person OV. Please relay our apologies for these legal restrictions.  Regardless, given the last OV 12/2022 with chest pain and labs this year showing somewhat worsening renal function, this is probably for the best.  Will route to callback team to cancel the 2:20 VV and arrange for in person OV. OK to see general APP. I also relayed this in secure chat given time sensitive nature of appt time.

## 2023-06-11 NOTE — Telephone Encounter (Signed)
Pts appt cancelled for today and he was put on with [name] Favre NP 06/20/23.

## 2023-06-11 NOTE — Telephone Encounter (Signed)
Will send message to EP scheduler to see where they can get the pt in for pre op appt.

## 2023-06-16 ENCOUNTER — Other Ambulatory Visit: Payer: Self-pay | Admitting: *Deleted

## 2023-06-16 DIAGNOSIS — M799 Soft tissue disorder, unspecified: Secondary | ICD-10-CM

## 2023-06-16 DIAGNOSIS — Z1289 Encounter for screening for malignant neoplasm of other sites: Secondary | ICD-10-CM

## 2023-06-20 ENCOUNTER — Ambulatory Visit: Payer: Medicare HMO | Attending: Physician Assistant | Admitting: Physician Assistant

## 2023-06-20 ENCOUNTER — Encounter: Payer: Self-pay | Admitting: Physician Assistant

## 2023-06-20 VITALS — BP 110/64 | HR 68 | Ht 67.0 in | Wt 228.0 lb

## 2023-06-20 DIAGNOSIS — E785 Hyperlipidemia, unspecified: Secondary | ICD-10-CM

## 2023-06-20 DIAGNOSIS — Z950 Presence of cardiac pacemaker: Secondary | ICD-10-CM

## 2023-06-20 DIAGNOSIS — I2510 Atherosclerotic heart disease of native coronary artery without angina pectoris: Secondary | ICD-10-CM

## 2023-06-20 DIAGNOSIS — I442 Atrioventricular block, complete: Secondary | ICD-10-CM | POA: Diagnosis not present

## 2023-06-20 DIAGNOSIS — R079 Chest pain, unspecified: Secondary | ICD-10-CM

## 2023-06-20 DIAGNOSIS — I10 Essential (primary) hypertension: Secondary | ICD-10-CM | POA: Diagnosis not present

## 2023-06-20 DIAGNOSIS — Z951 Presence of aortocoronary bypass graft: Secondary | ICD-10-CM

## 2023-06-20 MED ORDER — VALSARTAN-HYDROCHLOROTHIAZIDE 320-12.5 MG PO TABS: 1.0000 | ORAL_TABLET | Freq: Every day | ORAL | 3 refills | Status: DC

## 2023-06-20 MED ORDER — ISOSORBIDE MONONITRATE ER 30 MG PO TB24: 15.0000 mg | ORAL_TABLET | Freq: Every day | ORAL | 3 refills | Status: AC

## 2023-06-20 MED ORDER — ISOSORBIDE MONONITRATE ER 30 MG PO TB24
15.0000 mg | ORAL_TABLET | Freq: Every day | ORAL | 3 refills | Status: DC
Start: 2023-06-20 — End: 2023-06-20

## 2023-06-20 MED ORDER — NITROGLYCERIN 0.4 MG/SPRAY TL SOLN: 3 refills | Status: AC

## 2023-06-20 NOTE — Progress Notes (Signed)
Cardiology Office Note:  .   Date:  06/20/2023  ID:  [name] [name], DOB 01/20/1956, MRN [id number] PCP: Wilfrid Lund, PA  [facility] Providers Cardiologist:  Lewayne Bunting, MD {  History of Present Illness: [name] [name]   [name] [name] is a 67 y.o. male with a past medical history of CAD status post CABG, HTN and gout here for follow-up appointment.  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  He saw Dr. Ladona Ridgel back in April and at that time noted episodes of exertional chest pain which was different from what he experienced prior to CABG.  More severe.  He describes an episode where he was cleaning up his yard and moving a large 11 developed substernal chest pain and went inside took a sublingual nitroglycerin and then got relief at that point.  Had other episodes as well.  In addition, had some problems with his prostate  Ultimately, underwent left heart cath which showed patent negative circumflex, RCA and ramus.  Continue medical therapy was suggested.  Today, he tells me that he has random chest pain throughout the day.  Different times of the day.  Not necessarily exertional.  Some point he was told he has congestive heart failure and I am not seeing any indication in the records.  Normal LVEF on cardiac catheterization.  No recent echo.  We will order an updated 1 today.  He tells me that he feels like he is constantly using the bathroom with his HCTZ and his prostate medicine on board.  Blood pressure is low normal today.  Otherwise, tolerating medications.  Reports no shortness of breath nor dyspnea on exertion. No edema, orthopnea, PND. Reports no palpitations.    ROS: Pertinent ROS in HPI   Studies Reviewed: .       Cardiac catheterization 01/14/23   Ost LAD lesion is 100% stenosed.  LIMA to LAD is patent.   Mid Graft to Dist Graft lesion is 15% stenosed.  SVG to diagonal is patent with several areas of ectasia.   Prox Cx lesion is 10% stenosed.  Stenosis has decreased compared to  the 2019 cath.   The left ventricular systolic function is normal.   LV end diastolic pressure is normal.   The left ventricular ejection fraction is 50-55% by visual estimate.   There is no aortic valve stenosis.   Patent grafts.  Patent native circumflex, RCA and ramus.  Continue medical therapy.      Physical Exam:   VS:  BP 110/64   Pulse 68   Ht 5\' 7"  (1.702 m)   Wt 228 lb (103.4 kg)   SpO2 96%   BMI 35.71 kg/m    Wt Readings from Last 3 Encounters:  06/20/23 228 lb (103.4 kg)  05/26/23 233 lb 6.4 oz (105.9 kg)  01/14/23 238 lb (108 kg)    GEN: Well nourished, well developed in no acute distress NECK: No JVD; No carotid bruits CARDIAC: RRR, no murmurs, rubs, gallops RESPIRATORY:  Clear to auscultation without rales, wheezing or rhonchi  ABDOMEN: Soft, non-tender, non-distended EXTREMITIES:  No edema; No deformity   ASSESSMENT AND PLAN: .   1.  CAD status post CABG -Would continue current medications which include magnesium 300 mg daily, metoprolol succinate 25 mg daily, nitro as needed, Xarelto 20 mg daily, Proscar 5 mg daily, Diovan HCTZ changed to 320-12.5 mg daily, added Imdur 15 mg daily, add fresh nitroglycerin -continue to monitor chest symptoms  2.  Chest pain -most days  a week and needing to take nitroglycerin -triglycerides 201 -recommend fish oil 1,000mg  daily   3.  PPM -stable with no issues  4.  Obesity -unable to do much exercise -eats lots of sugar -recommended to try for a more balance diet with lean meats, vegetables, and fruit  5. HTN -Blood pressure 110/64 -Made a few changes today and encouraged him to continue tracking his blood pressure at home an hour after morning medications      Dispo: Next available with Dr. Lynnette Caffey  Signed, Sharlene Dory, PA-C

## 2023-06-20 NOTE — Patient Instructions (Addendum)
Medication Instructions:    START TAKING: DIOVAN-HCT 30-12.5  MG ONCE A DAY   START TAKING :IMDUR 15 MG ONCE A DAY   STOP TAKING AND REMOVE THIS MEDICATION FROM YOUR MEDICATION LIST:  DIOVAN 30-25 MG    *If you need a refill on your cardiac medications before your next appointment, please call your pharmacy*   Lab Work:  RETURN FOR LAB WORK  BMET 10-9    If you have labs (blood work) drawn today and your tests are completely normal, you will receive your results only by: MyChart Message (if you have MyChart) OR A paper copy in the mail If you have any lab test that is abnormal or we need to change your treatment, we will call you to review the results.   Testing/Procedures: Your physician has requested that you have an echocardiogram. Echocardiography is a painless test that uses sound waves to create images of your heart. It provides your doctor with information about the size and shape of your heart and how well your heart's chambers and valves are working. This procedure takes approximately one hour. There are no restrictions for this procedure. Please do NOT wear cologne, perfume, aftershave, or lotions (deodorant is allowed). Please arrive 15 minutes prior to your appointment time.    Follow-Up: At [facility], you and your health needs are our priority.  As part of our continuing mission to provide you with exceptional heart care, we have created designated Provider Care Teams.  These Care Teams include your primary Cardiologist (physician) and Advanced Practice Providers (APPs -  Physician Assistants and Nurse Practitioners) who all work together to provide you with the care you need, when you need it.  We recommend signing up for the patient portal called "MyChart".  Sign up information is provided on this After Visit Summary.  MyChart is used to connect with patients for Virtual Visits (Telemedicine).  Patients are able to view lab/test results, encounter notes,  upcoming appointments, etc.  Non-urgent messages can be sent to your provider as well.   To learn more about what you can do with MyChart, go to [email or web address].    Your next appointment:  NEXT AVAILABLE   Provider:  DR Lynnette Caffey  Other Instructions  CHECK YOUR BLOOD PRESSURE ONCE OR TWICE A WEEK TO MONITOR   Low-Sodium Eating Plan Salt (sodium) helps you keep a healthy balance of fluids in your body. Too much sodium can raise your blood pressure. It can also cause fluid and waste to be held in your body. Your health care provider or dietitian may recommend a low-sodium eating plan if you have high blood pressure (hypertension), kidney disease, liver disease, or heart failure. Eating less sodium can help lower your blood pressure and reduce swelling. It can also protect your heart, liver, and kidneys. What are tips for following this plan? Reading food labels  Check food labels for the amount of sodium per serving. If you eat more than one serving, you must multiply the listed amount by the number of servings. Choose foods with less than 140 milligrams (mg) of sodium per serving. Avoid foods with 300 mg of sodium or more per serving. Always check how much sodium is in a product, even if the label says "unsalted" or "no salt added." Shopping  Buy products labeled as "low-sodium" or "no salt added." Buy fresh foods. Avoid canned foods and pre-made or frozen meals. Avoid canned, cured, or processed meats. Buy breads that have less than 80 mg of  sodium per slice. Cooking  Eat more home-cooked food. Try to eat less restaurant, buffet, and fast food. Try not to add salt when you cook. Use salt-free seasonings or herbs instead of table salt or sea salt. Check with your provider or pharmacist before using salt substitutes. Cook with plant-based oils, such as canola, sunflower, or olive oil. Meal planning When eating at a restaurant, ask if your food can be made with less salt or no  salt. Avoid dishes labeled as brined, pickled, cured, or smoked. Avoid dishes made with soy sauce, miso, or teriyaki sauce. Avoid foods that have monosodium glutamate (MSG) in them. MSG may be added to some restaurant food, sauces, soups, bouillon, and canned foods. Make meals that can be grilled, baked, poached, roasted, or steamed. These are often made with less sodium. General information Try to limit your sodium intake to 1,500-2,300 mg each day, or the amount told by your provider. What foods should I eat? Fruits Fresh, frozen, or canned fruit. Fruit juice. Vegetables Fresh or frozen vegetables. "No salt added" canned vegetables. "No salt added" tomato sauce and paste. Low-sodium or reduced-sodium tomato and vegetable juice. Grains Low-sodium cereals, such as oats, puffed wheat and rice, and shredded wheat. Low-sodium crackers. Unsalted rice. Unsalted pasta. Low-sodium bread. Whole grain breads and whole grain pasta. Meats and other proteins Fresh or frozen meat, poultry, seafood, and fish. These should have no added salt. Low-sodium canned tuna and salmon. Unsalted nuts. Dried peas, beans, and lentils without added salt. Unsalted canned beans. Eggs. Unsalted nut butters. Dairy Milk. Soy milk. Cheese that is naturally low in sodium, such as ricotta cheese, fresh mozzarella, or Swiss cheese. Low-sodium or reduced-sodium cheese. Cream cheese. Yogurt. Seasonings and condiments Fresh and dried herbs and spices. Salt-free seasonings. Low-sodium mustard and ketchup. Sodium-free salad dressing. Sodium-free light mayonnaise. Fresh or refrigerated horseradish. Lemon juice. Vinegar. Other foods Homemade, reduced-sodium, or low-sodium soups. Unsalted popcorn and pretzels. Low-salt or salt-free chips. The items listed above may not be all the foods and drinks you can have. Talk to a dietitian to learn more. What foods should I avoid? Vegetables Sauerkraut, pickled vegetables, and relishes. Olives.  [place] fries. Onion rings. Regular canned vegetables, except low-sodium or reduced-sodium items. Regular canned tomato sauce and paste. Regular tomato and vegetable juice. Frozen vegetables in sauces. Grains Instant hot cereals. Bread stuffing, pancake, and biscuit mixes. Croutons. Seasoned rice or pasta mixes. Noodle soup cups. Boxed or frozen macaroni and cheese. Regular salted crackers. Self-rising flour. Meats and other proteins Meat or fish that is salted, canned, smoked, spiced, or pickled. Precooked or cured meat, such as sausages or meat loaves. Tomasa Blase. Ham. Pepperoni. Hot dogs. Corned beef. Chipped beef. Salt pork. Jerky. Pickled herring, anchovies, and sardines. Regular canned tuna. Salted nuts. Dairy Processed cheese and cheese spreads. Hard cheeses. Cheese curds. Blue cheese. Feta cheese. String cheese. Regular cottage cheese. Buttermilk. Canned milk. Fats and oils Salted butter. Regular margarine. Ghee. Bacon fat. Seasonings and condiments Onion salt, garlic salt, seasoned salt, table salt, and sea salt. Canned and packaged gravies. Worcestershire sauce. Tartar sauce. Barbecue sauce. Teriyaki sauce. Soy sauce, including reduced-sodium soy sauce. Steak sauce. Fish sauce. Oyster sauce. Cocktail sauce. Horseradish that you find on the shelf. Regular ketchup and mustard. Meat flavorings and tenderizers. Bouillon cubes. Hot sauce. Pre-made or packaged marinades. Pre-made or packaged taco seasonings. Relishes. Regular salad dressings. Salsa. Other foods Salted popcorn and pretzels. Corn chips and puffs. Potato and tortilla chips. Canned or dried soups. Pizza. Frozen entrees  and pot pies. The items listed above may not be all the foods and drinks you should avoid. Talk to a dietitian to learn more. This information is not intended to replace advice given to you by your health care provider. Make sure you discuss any questions you have with your health care provider. Document Revised: 09/19/2022  Document Reviewed: 09/19/2022 Elsevier Patient Education  2024 Elsevier Inc.   Heart-Healthy Eating Plan Eating a healthy diet is important for the health of your heart. A heart-healthy eating plan includes: Eating less unhealthy fats. Eating more healthy fats. Eating less salt in your food. Salt is also called sodium. Making other changes in your diet. Talk with your doctor or a diet specialist (dietitian) to create an eating plan that is right for you. What is my plan? Your doctor may recommend an eating plan that includes: Total fat: ______% or less of total calories a day. Saturated fat: ______% or less of total calories a day. Cholesterol: less than _________mg a day. Sodium: less than _________mg a day. What are tips for following this plan? Cooking Avoid frying your food. Try to bake, boil, grill, or broil it instead. You can also reduce fat by: Removing the skin from poultry. Removing all visible fats from meats. Steaming vegetables in water or broth. Meal planning  At meals, divide your plate into four equal parts: Fill one-half of your plate with vegetables and green salads. Fill one-fourth of your plate with whole grains. Fill one-fourth of your plate with lean protein foods. Eat 2-4 cups of vegetables per day. One cup of vegetables is: 1 cup (91 g) broccoli or cauliflower florets. 2 medium carrots. 1 large bell pepper. 1 large sweet potato. 1 large tomato. 1 medium white potato. 2 cups (150 g) raw leafy greens. Eat 1-2 cups of fruit per day. One cup of fruit is: 1 small apple 1 large banana 1 cup (237 g) mixed fruit, 1 large orange,  cup (82 g) dried fruit, 1 cup (240 mL) 100% fruit juice. Eat more foods that have soluble fiber. These are apples, broccoli, carrots, beans, peas, and barley. Try to get 20-30 g of fiber per day. Eat 4-5 servings of nuts, legumes, and seeds per week: 1 serving of dried beans or legumes equals  cup (90 g) cooked. 1 serving  of nuts is  oz (12 almonds, 24 pistachios, or 7 walnut halves). 1 serving of seeds equals  oz (8 g). General information Eat more home-cooked food. Eat less restaurant, buffet, and fast food. Limit or avoid alcohol. Limit foods that are high in starch and sugar. Avoid fried foods. Lose weight if you are overweight. Keep track of how much salt (sodium) you eat. This is important if you have high blood pressure. Ask your doctor to tell you more about this. Try to add vegetarian meals each week. Fats Choose healthy fats. These include olive oil and canola oil, flaxseeds, walnuts, almonds, and seeds. Eat more omega-3 fats. These include salmon, mackerel, sardines, tuna, flaxseed oil, and ground flaxseeds. Try to eat fish at least 2 times each week. Check food labels. Avoid foods with trans fats or high amounts of saturated fat. Limit saturated fats. These are often found in animal products, such as meats, butter, and cream. These are also found in plant foods, such as palm oil, palm kernel oil, and coconut oil. Avoid foods with partially hydrogenated oils in them. These have trans fats. Examples are stick margarine, some tub margarines, cookies, crackers, and other  baked goods. What foods should I eat? Fruits All fresh, canned (in natural juice), or frozen fruits. Vegetables Fresh or frozen vegetables (raw, steamed, roasted, or grilled). Green salads. Grains Most grains. Choose whole wheat and whole grains most of the time. Rice and pasta, including brown rice and pastas made with whole wheat. Meats and other proteins Lean, well-trimmed beef, veal, pork, and lamb. Chicken and [place] without skin. All fish and shellfish. Wild duck, rabbit, pheasant, and venison. Egg whites or low-cholesterol egg substitutes. Dried beans, peas, lentils, and tofu. Seeds and most nuts. Dairy Low-fat or nonfat cheeses, including ricotta and mozzarella. Skim or 1% milk that is liquid, powdered, or evaporated.  Buttermilk that is made with low-fat milk. Nonfat or low-fat yogurt. Fats and oils Non-hydrogenated (trans-free) margarines. Vegetable oils, including soybean, sesame, sunflower, olive, peanut, safflower, corn, canola, and cottonseed. Salad dressings or mayonnaise made with a vegetable oil. Beverages Mineral water. Coffee and tea. Diet carbonated beverages. Sweets and desserts Sherbet, gelatin, and fruit ice. Small amounts of dark chocolate. Limit all sweets and desserts. Seasonings and condiments All seasonings and condiments. The items listed above may not be a complete list of foods and drinks you can eat. Contact a dietitian for more options. What foods should I avoid? Fruits Canned fruit in heavy syrup. Fruit in cream or butter sauce. Fried fruit. Limit coconut. Vegetables Vegetables cooked in cheese, cream, or butter sauce. Fried vegetables. Grains Breads that are made with saturated or trans fats, oils, or whole milk. Croissants. Sweet rolls. Donuts. High-fat crackers, such as cheese crackers. Meats and other proteins Fatty meats, such as hot dogs, ribs, sausage, bacon, rib-eye roast or steak. High-fat deli meats, such as salami and bologna. Caviar. Domestic duck and goose. Organ meats, such as liver. Dairy Cream, sour cream, cream cheese, and creamed cottage cheese. Whole-milk cheeses. Whole or 2% milk that is liquid, evaporated, or condensed. Whole buttermilk. Cream sauce or high-fat cheese sauce. Yogurt that is made from whole milk. Fats and oils Meat fat, or shortening. Cocoa butter, hydrogenated oils, palm oil, coconut oil, palm kernel oil. Solid fats and shortenings, including bacon fat, salt pork, lard, and butter. Nondairy cream substitutes. Salad dressings with cheese or sour cream. Beverages Regular sodas and juice drinks with added sugar. Sweets and desserts Frosting. Pudding. Cookies. Cakes. Pies. Milk chocolate or white chocolate. Buttered syrups. Full-fat ice cream or  ice cream drinks. The items listed above may not be a complete list of foods and drinks to avoid. Contact a dietitian for more information. Summary Heart-healthy meal planning includes eating less unhealthy fats, eating more healthy fats, and making other changes in your diet. Eat a balanced diet. This includes fruits and vegetables, low-fat or nonfat dairy, lean protein, nuts and legumes, whole grains, and heart-healthy oils and fats. This information is not intended to replace advice given to you by your health care provider. Make sure you discuss any questions you have with your health care provider. Document Revised: 10/08/2021 Document Reviewed: 10/08/2021 Elsevier Patient Education  2024 [employer].

## 2023-06-23 ENCOUNTER — Ambulatory Visit: Payer: Medicare HMO | Attending: Internal Medicine

## 2023-06-23 DIAGNOSIS — I442 Atrioventricular block, complete: Secondary | ICD-10-CM

## 2023-06-23 LAB — CUP PACEART REMOTE DEVICE CHECK
Battery Remaining Longevity: 21 mo
Battery Voltage: 2.92 V
Brady Statistic AP VP Percent: 18.07 %
Brady Statistic AP VS Percent: 0 %
Brady Statistic AS VP Percent: 81.58 %
Brady Statistic AS VS Percent: 0.34 %
Brady Statistic RA Percent Paced: 17.85 %
Brady Statistic RV Percent Paced: 99.62 %
Date Time Interrogation Session: 20241007115352
Implantable Lead Connection Status: 753985
Implantable Lead Connection Status: 753985
Implantable Lead Implant Date: 20161228
Implantable Lead Implant Date: 20161228
Implantable Lead Location: 753859
Implantable Lead Location: 753860
Implantable Lead Model: 5076
Implantable Lead Model: 5076
Implantable Pulse Generator Implant Date: 20161228
Lead Channel Impedance Value: 342 Ohm
Lead Channel Impedance Value: 361 Ohm
Lead Channel Impedance Value: 380 Ohm
Lead Channel Impedance Value: 418 Ohm
Lead Channel Pacing Threshold Amplitude: 0.875 V
Lead Channel Pacing Threshold Amplitude: 1.125 V
Lead Channel Pacing Threshold Pulse Width: 0.4 ms
Lead Channel Pacing Threshold Pulse Width: 0.4 ms
Lead Channel Sensing Intrinsic Amplitude: 1 mV
Lead Channel Sensing Intrinsic Amplitude: 1 mV
Lead Channel Sensing Intrinsic Amplitude: 11.875 mV
Lead Channel Sensing Intrinsic Amplitude: 11.875 mV
Lead Channel Setting Pacing Amplitude: 2.5 V
Lead Channel Setting Pacing Amplitude: 2.5 V
Lead Channel Setting Pacing Pulse Width: 0.4 ms
Lead Channel Setting Sensing Sensitivity: 2.8 mV
Zone Setting Status: 755011
Zone Setting Status: 755011

## 2023-06-25 ENCOUNTER — Ambulatory Visit: Payer: Medicare HMO | Attending: Physician Assistant

## 2023-06-25 DIAGNOSIS — I442 Atrioventricular block, complete: Secondary | ICD-10-CM

## 2023-06-25 DIAGNOSIS — I2510 Atherosclerotic heart disease of native coronary artery without angina pectoris: Secondary | ICD-10-CM

## 2023-06-26 ENCOUNTER — Encounter (HOSPITAL_COMMUNITY)
Admission: RE | Admit: 2023-06-26 | Discharge: 2023-06-26 | Disposition: A | Discharge status: Home / Self Care | Payer: Medicare HMO | Source: Ambulatory Visit | Attending: Gastroenterology | Admitting: Gastroenterology

## 2023-06-26 DIAGNOSIS — I700 Atherosclerosis of aorta: Secondary | ICD-10-CM | POA: Diagnosis not present

## 2023-06-26 DIAGNOSIS — M799 Soft tissue disorder, unspecified: Secondary | ICD-10-CM | POA: Diagnosis not present

## 2023-06-26 DIAGNOSIS — Z1289 Encounter for screening for malignant neoplasm of other sites: Secondary | ICD-10-CM | POA: Diagnosis present

## 2023-06-26 DIAGNOSIS — R161 Splenomegaly, not elsewhere classified: Secondary | ICD-10-CM | POA: Insufficient documentation

## 2023-06-26 DIAGNOSIS — Z951 Presence of aortocoronary bypass graft: Secondary | ICD-10-CM | POA: Diagnosis not present

## 2023-06-26 LAB — BASIC METABOLIC PANEL
BUN/Creatinine Ratio: 12 (ref 10–24)
BUN: 16 mg/dL (ref 8–27)
CO2: 23 mmol/L (ref 20–29)
Calcium: 10 mg/dL (ref 8.6–10.2)
Chloride: 100 mmol/L (ref 96–106)
Creatinine, Ser: 1.3 mg/dL — ABNORMAL HIGH (ref 0.76–1.27)
Glucose: 89 mg/dL (ref 70–99)
Potassium: 4.2 mmol/L (ref 3.5–5.2)
Sodium: 140 mmol/L (ref 134–144)
eGFR: 61 mL/min/{1.73_m2} (ref >59)

## 2023-06-26 MED ORDER — FLUDEOXYGLUCOSE F - 18 (FDG) INJECTION
11.3500 | Freq: Once | INTRAVENOUS | Status: AC | PRN
  Administered 2023-06-26: 11.35 via INTRAVENOUS

## 2023-07-03 NOTE — Telephone Encounter (Signed)
Update on clearance.

## 2023-07-03 NOTE — Telephone Encounter (Signed)
Patient Name: [name] [name]  DOB: 29-Mar-1956 MRN: [id number]  Primary Cardiologist: Lewayne Bunting, MD  Chart reviewed as part of pre-operative protocol coverage. Pre-op clearance already addressed by colleagues in earlier phone notes. To summarize recommendations:  -I saw the patient on 10/4 and ordered an echocardiogram.  This is scheduled for 10/28.  Preop guidance will be provided following the study.  Will route this bundled recommendation to requesting provider via Epic fax function and remove from pre-op pool. Please call with questions.  Sharlene Dory, PA-C 07/03/2023, 11:36 AM

## 2023-07-04 NOTE — Progress Notes (Signed)
Remote pacemaker transmission.   

## 2023-07-14 ENCOUNTER — Ambulatory Visit (HOSPITAL_COMMUNITY): Payer: Medicare HMO | Attending: Cardiology

## 2023-07-14 DIAGNOSIS — R079 Chest pain, unspecified: Secondary | ICD-10-CM | POA: Diagnosis present

## 2023-07-14 LAB — ECHOCARDIOGRAM COMPLETE
Area-P 1/2: 2.95 cm2
S' Lateral: 3.25 cm

## 2023-07-16 ENCOUNTER — Other Ambulatory Visit: Payer: Self-pay | Admitting: *Deleted

## 2023-07-16 DIAGNOSIS — M799 Soft tissue disorder, unspecified: Secondary | ICD-10-CM

## 2023-07-16 NOTE — Telephone Encounter (Signed)
Update on clearance.

## 2023-07-16 NOTE — Progress Notes (Signed)
referral

## 2023-07-18 NOTE — Telephone Encounter (Signed)
   Name: [name] [name]  DOB: 1956-03-20  MRN: [id number]   Primary Cardiologist: Lewayne Bunting, MD  Chart reviewed as part of pre-operative protocol coverage. [name] was last seen on 06/20/2023 by Jari Favre, PA-C.  Patient underwent echo for further evaluation. Per Tessa: "Reviewed recent echocardiogram 10/28.  No major changes from 2020.  Okay to undergo the upcoming procedure without any further cardiovascular testing."   Per Pharm D, patient may hold Xarelto for 2 days prior to procedure.    I will route this recommendation to the requesting party via Epic fax function and remove from pre-op pool. Please call with questions.  Carlos Levering, NP 07/18/2023, 2:54 PM

## 2023-07-18 NOTE — Telephone Encounter (Signed)
Left message for the pt that he has been cleared and clearance notes have been faxed to surgeon office. Left message ok to hold blood thinner x 2 days and resume when surgeon feels it is safe.

## 2023-07-24 ENCOUNTER — Encounter: Payer: Self-pay | Admitting: *Deleted

## 2023-07-24 ENCOUNTER — Other Ambulatory Visit: Payer: Self-pay | Admitting: *Deleted

## 2023-07-24 MED ORDER — PEG 3350-KCL-NA BICARB-NACL 420 G PO SOLR: 4000.0000 mL | Freq: Once | ORAL | 0 refills | Status: AC

## 2023-07-28 ENCOUNTER — Ambulatory Visit (HOSPITAL_COMMUNITY)
Admission: RE | Admit: 2023-07-28 | Discharge: 2023-07-28 | Disposition: A | Discharge status: Home / Self Care | Payer: Medicare HMO | Source: Ambulatory Visit | Attending: Internal Medicine | Admitting: Internal Medicine

## 2023-07-28 ENCOUNTER — Encounter (HOSPITAL_COMMUNITY): Payer: Self-pay | Admitting: Internal Medicine

## 2023-07-28 ENCOUNTER — Inpatient Hospital Stay: Payer: Medicare HMO

## 2023-07-28 ENCOUNTER — Other Ambulatory Visit: Payer: Self-pay

## 2023-07-28 ENCOUNTER — Encounter: Payer: Self-pay | Admitting: Hematology

## 2023-07-28 ENCOUNTER — Ambulatory Visit (HOSPITAL_COMMUNITY): Payer: Medicare HMO | Admitting: Anesthesiology

## 2023-07-28 ENCOUNTER — Inpatient Hospital Stay: Payer: Medicare HMO | Attending: Hematology | Admitting: Hematology

## 2023-07-28 ENCOUNTER — Encounter (HOSPITAL_COMMUNITY)
Admission: RE | Disposition: A | Discharge status: Home / Self Care | Payer: Self-pay | Source: Ambulatory Visit | Attending: Internal Medicine

## 2023-07-28 VITALS — BP 100/82 | HR 94 | Temp 98.1°F | Resp 16 | Ht 68.0 in | Wt 223.8 lb

## 2023-07-28 DIAGNOSIS — K3189 Other diseases of stomach and duodenum: Secondary | ICD-10-CM | POA: Diagnosis not present

## 2023-07-28 DIAGNOSIS — R1013 Epigastric pain: Secondary | ICD-10-CM | POA: Insufficient documentation

## 2023-07-28 DIAGNOSIS — I480 Paroxysmal atrial fibrillation: Secondary | ICD-10-CM | POA: Diagnosis not present

## 2023-07-28 DIAGNOSIS — F319 Bipolar disorder, unspecified: Secondary | ICD-10-CM | POA: Insufficient documentation

## 2023-07-28 DIAGNOSIS — Z1211 Encounter for screening for malignant neoplasm of colon: Secondary | ICD-10-CM

## 2023-07-28 DIAGNOSIS — G4733 Obstructive sleep apnea (adult) (pediatric): Secondary | ICD-10-CM | POA: Diagnosis not present

## 2023-07-28 DIAGNOSIS — I441 Atrioventricular block, second degree: Secondary | ICD-10-CM | POA: Diagnosis not present

## 2023-07-28 DIAGNOSIS — K766 Portal hypertension: Secondary | ICD-10-CM | POA: Diagnosis not present

## 2023-07-28 DIAGNOSIS — I110 Hypertensive heart disease with heart failure: Secondary | ICD-10-CM | POA: Insufficient documentation

## 2023-07-28 DIAGNOSIS — I495 Sick sinus syndrome: Secondary | ICD-10-CM | POA: Insufficient documentation

## 2023-07-28 DIAGNOSIS — R110 Nausea: Secondary | ICD-10-CM

## 2023-07-28 DIAGNOSIS — Z87891 Personal history of nicotine dependence: Secondary | ICD-10-CM | POA: Insufficient documentation

## 2023-07-28 DIAGNOSIS — I2510 Atherosclerotic heart disease of native coronary artery without angina pectoris: Secondary | ICD-10-CM | POA: Diagnosis not present

## 2023-07-28 DIAGNOSIS — I442 Atrioventricular block, complete: Secondary | ICD-10-CM | POA: Insufficient documentation

## 2023-07-28 DIAGNOSIS — I252 Old myocardial infarction: Secondary | ICD-10-CM | POA: Insufficient documentation

## 2023-07-28 DIAGNOSIS — K219 Gastro-esophageal reflux disease without esophagitis: Secondary | ICD-10-CM | POA: Diagnosis not present

## 2023-07-28 DIAGNOSIS — R161 Splenomegaly, not elsewhere classified: Secondary | ICD-10-CM | POA: Diagnosis not present

## 2023-07-28 DIAGNOSIS — K529 Noninfective gastroenteritis and colitis, unspecified: Secondary | ICD-10-CM | POA: Insufficient documentation

## 2023-07-28 DIAGNOSIS — R1011 Right upper quadrant pain: Secondary | ICD-10-CM

## 2023-07-28 DIAGNOSIS — K640 First degree hemorrhoids: Secondary | ICD-10-CM

## 2023-07-28 DIAGNOSIS — Z950 Presence of cardiac pacemaker: Secondary | ICD-10-CM | POA: Insufficient documentation

## 2023-07-28 DIAGNOSIS — I5022 Chronic systolic (congestive) heart failure: Secondary | ICD-10-CM | POA: Diagnosis not present

## 2023-07-28 DIAGNOSIS — M7989 Other specified soft tissue disorders: Secondary | ICD-10-CM | POA: Diagnosis not present

## 2023-07-28 HISTORY — PX: BIOPSY: SHX5522

## 2023-07-28 HISTORY — PX: COLONOSCOPY WITH PROPOFOL: SHX5780

## 2023-07-28 HISTORY — PX: ESOPHAGOGASTRODUODENOSCOPY (EGD) WITH PROPOFOL: SHX5813

## 2023-07-28 SURGERY — COLONOSCOPY WITH PROPOFOL
Anesthesia: General

## 2023-07-28 MED ORDER — PHENYLEPHRINE 80 MCG/ML (10ML) SYRINGE FOR IV PUSH (FOR BLOOD PRESSURE SUPPORT)
PREFILLED_SYRINGE | INTRAVENOUS | Status: DC | PRN
  Administered 2023-07-28 (×2): 80 ug via INTRAVENOUS

## 2023-07-28 MED ORDER — LIDOCAINE HCL (PF) 2 % IJ SOLN
INTRAMUSCULAR | Status: DC | PRN
  Administered 2023-07-28: 100 mg via INTRADERMAL

## 2023-07-28 MED ORDER — STERILE WATER FOR IRRIGATION IR SOLN
Status: DC | PRN
  Administered 2023-07-28: 240 mL

## 2023-07-28 MED ORDER — LACTATED RINGERS IV SOLN: INTRAVENOUS | Status: DC | PRN

## 2023-07-28 MED ORDER — PROPOFOL 500 MG/50ML IV EMUL
INTRAVENOUS | Status: DC | PRN
  Administered 2023-07-28: 150 ug/kg/min via INTRAVENOUS
  Administered 2023-07-28: 100 mg via INTRAVENOUS

## 2023-07-28 NOTE — H&P (Signed)
@LOGO @   Primary Care Physician:  Wilfrid Lund, PA Primary Gastroenterologist:  Dr. Jena Gauss  Pre-Procedure History & Physical: HPI:  [name] [name] is a 67 y.o. male here for  further evaluation to right upper quadrant abdominal pain chest abdominal fullness via EGD.  Also here for average for screening colonoscopy   Xarelto has been held x 2 days CT demonstrated soft tissue mass in the right retroperitoneal area.  PET negative.  Seen being seen by oncology.   Patient with known cholelithiasis. Past Medical History:  Diagnosis Date   ALLERGIC RHINITIS 05/14/2010   Qualifier: Diagnosis of  By: Garnette Czech PA, Dawn     Asthma    Asthma    Atrial fibrillation [facility])    new November 2016   Bipolar 1 disorder [employer])    "put me on this after I got out of drug rehab 11/18/2005 and couldn't sleep; added anxiety RX and have been sleeping fine since"   Bradycardia    Very limited Wenkebach on event recorder November 2010   CAD (coronary artery disease) 2006   3v CAD >> s/p CABG in [place], patent LIMA to LAD and SVG to diagonal with 40% stenosis 2010 // Nuc 2/14: low risk, EF 56 // LHC 3/16: patent L-LAD, patent S-Dx // Echo 3/16: mild conc LVH, EF 60-65, Gr 1 DD, trivial TR, mild LAE // LHC 12/19: oLAD 100, pLCx 20, S-D1 patent, L-LAD patent, EF 45   Cardiac pacemaker in situ 08/18/2018   CHB (complete heart block) (HCC) 12/02/2014   Chest pain with moderate risk of acute coronary syndrome    Chronic systolic CHF (congestive heart failure) (HCC) 09/15/2018   EF 45 by LHC in 12/19 // Echo 09/2018: inf HK, EF 45-50, mod LAE   Dyslipidemia    mixed    ED (erectile dysfunction)    Ejection fraction    EF 50%, catheterization, September, 2010   //   EF 60-65%, echo, December 03, 2014    Essential hypertension    GERD (gastroesophageal reflux disease)    Gout    History of stomach ulcers 1980's   Hx of CABG    2006, [place]    Memory loss-SVD on MRI March 2015 11/18/2013   Mixed hyperlipidemia    pt  states "they took me off q med they had me on for high cholesterol; my good cholesterol is low" (09/13/2015)   Myocardial infarction (HCC) 2006   OSA (obstructive sleep apnea)    patient denies, stated had a sleep study and he was told he did not have     PAF (paroxysmal atrial fibrillation) (HCC) 08/18/2018   Palpitations    Event recorder November, 2010, no significant arrhythmias    Pneumonia 2015   Pneumonia 1990's X 4 in one year   "double pneumonia"   Presence of permanent cardiac pacemaker    Second degree AV block 09/13/2015   Second degree AV block, Mobitz type I 03/10/2013   Patient had Mobitz 1 heart block that was asymptomatic. This resolved off beta blockade while in the hospital March, 2016. Plan to keep the patient off beta blockers. Patient will be seen in follow-up by Dr. Ladona Ridgel and the [place] office.    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[facility])     Past Surgical History:  Procedure Laterality Date   BACK SURGERY     CARDIAC CATHETERIZATION     "I've had quite a few; don't remember any stents" (09/13/2015)   CORONARY ANGIOPLASTY  CORONARY ARTERY BYPASS GRAFT  2006   "CABG X2, in [place]"   EP IMPLANTABLE DEVICE N/A 09/13/2015   Procedure: Pacemaker Implant;  Surgeon: Hillis Range, MD;  Location: [facility] INVASIVE CV LAB;  Service: Cardiovascular;  Laterality: N/A;   INSERT / REPLACE / REMOVE PACEMAKER  09/13/2015   [employer]   LEFT HEART CATH AND CORS/GRAFTS ANGIOGRAPHY N/A 08/26/2018   Procedure: LEFT HEART CATH AND CORS/GRAFTS ANGIOGRAPHY;  Surgeon: Lennette Bihari, MD;  Location: [facility] INVASIVE CV LAB;  Service: Cardiovascular;  Laterality: N/A;   LEFT HEART CATH AND CORS/GRAFTS ANGIOGRAPHY N/A 01/14/2023   Procedure: LEFT HEART CATH AND CORS/GRAFTS ANGIOGRAPHY;  Surgeon: Corky Crafts, MD;  Location: [facility] INVASIVE CV LAB;  Service: Cardiovascular;  Laterality: N/A;   LEFT HEART CATHETERIZATION WITH CORONARY/GRAFT ANGIOGRAM N/A 12/05/2014   Procedure: LEFT HEART  CATHETERIZATION WITH Isabel Caprice;  Surgeon: Peter M Swaziland, MD;  Location: [facility] CATH LAB;  Service: Cardiovascular;  Laterality: N/A;   LEG SURGERY Left 1961   "had 5 boils come up on my leg; they had to cut them out and put a drainage tube in there"   LUMBAR DISC SURGERY  X 3   "ruptured disc"   MAXIMUM ACCESS (MAS)POSTERIOR LUMBAR INTERBODY FUSION (PLIF) 1 LEVEL  ~ 2003   "L4-5; put rods & screws in"    Prior to Admission medications   Medication Sig Start Date End Date Taking? Authorizing Provider  allopurinol (ZYLOPRIM) 300 MG tablet Take 300 mg by mouth every evening.   Yes [provider]  colchicine 0.6 MG tablet Take 0.6 mg by mouth daily as needed (Gout). 06/12/15  Yes [provider]  fluticasone (FLONASE) 50 MCG/ACT nasal spray Place 2 sprays into both nostrils daily.   Yes [provider]  isosorbide mononitrate (IMDUR) 30 MG 24 hr tablet Take 0.5 tablets (15 mg total) by mouth daily. 06/20/23 09/18/23 Yes Conte, Tessa N, PA-C  loratadine (CLARITIN) 10 MG tablet Take 10 mg by mouth daily.   Yes [provider]  Magnesium 300 MG CAPS Take 300 mg by mouth daily.   Yes [provider]  Melatonin 10 MG SUBL 1 tablet under the tongue and allow to dissolve at bedtime as needed Sublingual at bedtime   Yes [provider]  metoprolol succinate (TOPROL-XL) 25 MG 24 hr tablet TAKE 1 TABLET BY MOUTH EVERY DAY. MAY TAKE 1/2 TO 1 TABLET EXTRA FOR PALITATTIONS 12/28/19  Yes Marinus Maw, MD  Omega-3 Fatty Acids (FISH OIL PO) Take 900 mg by mouth daily.   Yes [provider]  omeprazole (PRILOSEC) 40 MG capsule Take 1 capsule (40 mg total) by mouth daily. 05/26/23  Yes Letta Median, PA-C  POTASSIUM PO Take 1 tablet by mouth daily.   Yes [provider]  QUEtiapine (SEROQUEL) 300 MG tablet Take 300 mg by mouth at bedtime.   Yes [provider]  rosuvastatin (CRESTOR) 10 MG tablet TAKE 1 TABLET BY MOUTH  EVERY DAY 12/04/20  Yes Marinus Maw, MD  silodosin (RAPAFLO) 8 MG CAPS capsule TAKE 1 CAPSULE (8 MG TOTAL) BY MOUTH AT BEDTIME. 07/11/22  Yes McKenzie, Mardene Celeste, MD  temazepam (RESTORIL) 30 MG capsule Take 30 mg by mouth at bedtime.  02/20/16  Yes [provider]  valsartan-hydrochlorothiazide (DIOVAN HCT) 320-12.5 MG tablet Take 1 tablet by mouth daily. 06/20/23  Yes Conte, Tessa N, PA-C  nitroGLYCERIN (NITROLINGUAL) 0.4 MG/SPRAY spray USE 1 SPRAY UNDER THE TONGUE EVERY 5 MINUTES AS  NEEDED FOR CHEST PAIN Patient not taking: Reported on 07/28/2023 06/20/23   Sharlene Dory, PA-C  ondansetron (ZOFRAN) 4 MG tablet Take 1 tablet (4 mg total) by mouth every 8 (eight) hours as needed for nausea or vomiting. Patient not taking: Reported on 07/28/2023 05/26/23   Letta Median, PA-C  rivaroxaban (XARELTO) 20 MG TABS tablet Take 1 tablet (20 mg total) by mouth daily with supper. 01/15/23   Corky Crafts, MD    Allergies as of 07/24/2023 - Review Complete 06/20/2023  Allergen Reaction Noted   Sulfonamide derivatives Hives    Zolpidem Hives 10/30/2022   Zolpidem tartrate Other (See Comments) 05/04/2021    Family History  Problem Relation Age of Onset   COPD Mother    Lung cancer Mother    Stroke Mother    Heart attack Mother    Heart disease Father    Heart attack Father    Hyperlipidemia Brother    Hypertension Brother    Hypertension Brother    Hypertension Sister     Social History   Socioeconomic History   Marital status: Married    Spouse name: Not on file   Number of children: Not on file   Years of education: Not on file   Highest education level: Not on file  Occupational History   Not on file  Tobacco Use   Smoking status: Former    Current packs/day: 0.00    Average packs/day: 2.0 packs/day for 17.0 years (34.0 ttl pk-yrs)    Types: Cigarettes    Start date: 03/13/2000    Quit date: 03/13/2017    Years since quitting: 6.3   Smokeless tobacco: Current     Types: Snuff   Tobacco comments:    09/13/2015 "quit chewing tobacco August 2016"  Vaping Use   Vaping status: Never Used  Substance and Sexual Activity   Alcohol use: Not Currently    Comment: 09/13/2015 "got out of alcohol rehab 11/18/2005"   Drug use: Not Currently    Comment: 09/13/2015 "got out of rehab for pills 11/18/2005"   Sexual activity: Yes    Birth control/protection: None  Other Topics Concern   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    Social Determinants of Health   Financial Resource Strain: Not on file  Food Insecurity: No Food Insecurity (07/28/2023)   Hunger Vital Sign    Worried About Running Out of Food in the Last Year: Never true    Ran Out of Food in the Last Year: Never true  Transportation Needs: No Transportation Needs (07/28/2023)   PRAPARE - Administrator, Civil Service (Medical): No    Lack of Transportation (Non-Medical): No  Physical Activity: Not on file  Stress: Not on file  Social Connections: Not on file  Intimate Partner Violence: Not At Risk (07/28/2023)   Humiliation, Afraid, Rape, and Kick questionnaire    Fear of Current or Ex-Partner: No    Emotionally Abused: No    Physically Abused: No    Sexually Abused: No    Review of Systems: See HPI, otherwise negative ROS  Physical Exam: BP 109/74   Pulse 81   Temp 97.6 F (36.4 C) (Oral)   Resp 10   SpO2 97%  General:   Alert,  Well-developed, well-nourished, pleasant and cooperative in NAD No significant cervical adenopathy. Lungs:  Clear throughout to auscultation.   No wheezes, crackles, or rhonchi. No acute  distress. Heart:  Regular rate and rhythm; no murmurs, clicks, rubs,  or gallops. Abdomen: Non-distended, normal bowel sounds.  Soft and nontender without appreciable mass or hepatosplenomegaly.   Impression/Plan:   67 year old gentleman with longstanding GERD recently improved with twice daily PPI no  dysphagia.  Upper abdominal discomfort intermittent nausea.  Continues to have nonbloody diarrhea.  Stool studies negative.  Have offered diagnostic EGD and screening colonoscopy today per plan.  Xarelto held x 2 days.  The risks, benefits, limitations, imponderables and alternatives regarding both EGD and colonoscopy have been reviewed with the patient. Questions have been answered. All parties agreeable.       Notice: This dictation was prepared with Dragon dictation along with smaller phrase technology. Any transcriptional errors that result from this process are unintentional and may not be corrected upon review.

## 2023-07-28 NOTE — Anesthesia Preprocedure Evaluation (Addendum)
Anesthesia Evaluation  Patient identified by MRN, date of birth, ID band Patient awake    Reviewed: Allergy & Precautions, H&P , NPO status , Patient's Chart, lab work & pertinent test results, reviewed documented beta blocker date and time   Airway Mallampati: II  TM Distance: >3 FB Neck ROM: full    Dental  (+) Dental Advisory Given, Edentulous Upper, Partial Lower   Pulmonary asthma , sleep apnea , pneumonia, former smoker   Pulmonary exam normal breath sounds clear to auscultation       Cardiovascular hypertension, + CAD, + Past MI and +CHF  Normal cardiovascular exam+ dysrhythmias Atrial Fibrillation + pacemaker  Rhythm:regular Rate:Normal  EF 45%.  Grade one diastolic dysfunction.  History mobitz 1 and complete heart block but now has pacemaker in.   Neuro/Psych  PSYCHIATRIC DISORDERS   Bipolar Disorder   negative neurological ROS     GI/Hepatic Neg liver ROS,GERD  ,,  Endo/Other  negative endocrine ROS    Renal/GU negative Renal ROS  negative genitourinary   Musculoskeletal   Abdominal   Peds  Hematology negative hematology ROS (+)   Anesthesia Other Findings   Reproductive/Obstetrics negative OB ROS                             Anesthesia Physical Anesthesia Plan  ASA: 3  Anesthesia Plan: General   Post-op Pain Management: Minimal or no pain anticipated   Induction: Intravenous  PONV Risk Score and Plan: Propofol infusion  Airway Management Planned: Nasal Cannula and Natural Airway  Additional Equipment: None  Intra-op Plan:   Post-operative Plan:   Informed Consent: I have reviewed the patients History and Physical, chart, labs and discussed the procedure including the risks, benefits and alternatives for the proposed anesthesia with the patient or authorized representative who has indicated his/her understanding and acceptance.     Dental Advisory Given  Plan  Discussed with: CRNA  Anesthesia Plan Comments:         Anesthesia Quick Evaluation

## 2023-07-28 NOTE — Discharge Instructions (Signed)
EGD Discharge instructions Please read the instructions outlined below and refer to this sheet in the next few weeks. These discharge instructions provide you with general information on caring for yourself after you leave the hospital. Your doctor may also give you specific instructions. While your treatment has been planned according to the most current medical practices available, unavoidable complications occasionally occur. If you have any problems or questions after discharge, please call your doctor. ACTIVITY You may resume your regular activity but move at a slower pace for the next 24 hours.  Take frequent rest periods for the next 24 hours.  Walking will help expel (get rid of) the air and reduce the bloated feeling in your abdomen.  No driving for 24 hours (because of the anesthesia (medicine) used during the test).  You may shower.  Do not sign any important legal documents or operate any machinery for 24 hours (because of the anesthesia used during the test).  NUTRITION Drink plenty of fluids.  You may resume your normal diet.  Begin with a light meal and progress to your normal diet.  Avoid alcoholic beverages for 24 hours or as instructed by your caregiver.  MEDICATIONS You may resume your normal medications unless your caregiver tells you otherwise.  WHAT YOU CAN EXPECT TODAY You may experience abdominal discomfort such as a feeling of fullness or "gas" pains.  FOLLOW-UP Your doctor will discuss the results of your test with you.  SEEK IMMEDIATE MEDICAL ATTENTION IF ANY OF THE FOLLOWING OCCUR: Excessive nausea (feeling sick to your stomach) and/or vomiting.  Severe abdominal pain and distention (swelling).  Trouble swallowing.  Temperature over 101 F (37.8 C).  Rectal bleeding or vomiting of blood.     Colonoscopy Discharge Instructions  Read the instructions outlined below and refer to this sheet in the next few weeks. These discharge instructions provide you with  general information on caring for yourself after you leave the hospital. Your doctor may also give you specific instructions. While your treatment has been planned according to the most current medical practices available, unavoidable complications occasionally occur. If you have any problems or questions after discharge, call Dr. Jena Gauss at [phone]. ACTIVITY You may resume your regular activity, but move at a slower pace for the next 24 hours.  Take frequent rest periods for the next 24 hours.  Walking will help get rid of the air and reduce the bloated feeling in your belly (abdomen).  No driving for 24 hours (because of the medicine (anesthesia) used during the test).   Do not sign any important legal documents or operate any machinery for 24 hours (because of the anesthesia used during the test).  NUTRITION Drink plenty of fluids.  You may resume your normal diet as instructed by your doctor.  Begin with a light meal and progress to your normal diet. Heavy or fried foods are harder to digest and may make you feel sick to your stomach (nauseated).  Avoid alcoholic beverages for 24 hours or as instructed.  MEDICATIONS You may resume your normal medications unless your doctor tells you otherwise.  WHAT YOU CAN EXPECT TODAY Some feelings of bloating in the abdomen.  Passage of more gas than usual.  Spotting of blood in your stool or on the toilet paper.  IF YOU HAD POLYPS REMOVED DURING THE COLONOSCOPY: No aspirin products for 7 days or as instructed.  No alcohol for 7 days or as instructed.  Eat a soft diet for the next 24 hours.  FINDING OUT THE RESULTS OF YOUR TEST Not all test results are available during your visit. If your test results are not back during the visit, make an appointment with your caregiver to find out the results. Do not assume everything is normal if you have not heard from your caregiver or the medical facility. It is important for you to follow up on all of your test  results.  SEEK IMMEDIATE MEDICAL ATTENTION IF: You have more than a spotting of blood in your stool.  Your belly is swollen (abdominal distention).  You are nauseated or vomiting.  You have a temperature over 101.  You have abdominal pain or discomfort that is severe or gets worse throughout the day.       Your upper GI tract did not reveal a cause your symptoms.  You do have some subtle changes in your stomach that could be coming from your liver.  Biopsies taken    Your colon looked good; recommend repeat colonoscopy in 10 years.  I did take biopsies to look for the cause of diarrhea.    Further recommendations to follow pending review of pathology report   office visit with Ermalinda Memos in 4 weeks   at patient request, I called Nadine at [phone] -  unable to reach.

## 2023-07-28 NOTE — Patient Instructions (Addendum)
[facility]  Discharge Instructions  You were seen and examined today by Dr. Ellin Saba. Dr. Ellin Saba is a medical oncologist, meaning that he specializes in the treatment of cancer diagnoses. Dr. Ellin Saba discussed your past medical history, family history of cancers, and the events that led to you being here today.  You were referred to Dr. Ellin Saba due to an abnormal PET scan which revealed small nodules near your kidney on the right side however they do not indicate cancer on the PET scan as they did not light up.  Dr. Ellin Saba has recommended routine follow-up since they are new nodules.  Follow-up as scheduled.  Thank you for choosing [facility] to provide your oncology and hematology care.   To afford each patient quality time with our provider, please arrive at least 15 minutes before your scheduled appointment time. You may need to reschedule your appointment if you arrive late (10 or more minutes). Arriving late affects you and other patients whose appointments are after yours.  Also, if you miss three or more appointments without notifying the office, you may be dismissed from the clinic at the provider's discretion.    Again, thank you for choosing [facility].  Our hope is that these requests will decrease the amount of time that you wait before being seen by our physicians.   If you have a lab appointment with the Cancer Center - please note that after April 8th, all labs will be drawn in the cancer center.  You do not have to check in or register with the main entrance as you have in the past but will complete your check-in at the cancer center.            _____________________________________________________________  Should you have questions after your visit to [facility], please contact our office at [phone] and follow the prompts.  Our office hours are 8:00 a.m. to 4:30 p.m.  Monday - Thursday and 8:00 a.m. to 2:30 p.m. Friday.  Please note that voicemails left after 4:00 p.m. may not be returned until the following business day.  We are closed weekends and all major holidays.  You do have access to a nurse 24-7, just call the main number to the clinic [phone] and do not press any options, hold on the line and a nurse will answer the phone.    For prescription refill requests, have your pharmacy contact our office and allow 72 hours.    Masks are no longer required in the cancer centers. If you would like for your care team to wear a mask while they are taking care of you, please let them know. You may have one support person who is at least 65 years old accompany you for your appointments.

## 2023-07-28 NOTE — Anesthesia Postprocedure Evaluation (Signed)
Anesthesia Post Note  Patient: [name] [name]  Procedure(s) Performed: COLONOSCOPY WITH PROPOFOL ESOPHAGOGASTRODUODENOSCOPY (EGD) WITH PROPOFOL BIOPSY  Patient location during evaluation: PACU Anesthesia Type: General Level of consciousness: awake and alert Pain management: pain level controlled Vital Signs Assessment: post-procedure vital signs reviewed and stable Respiratory status: spontaneous breathing, nonlabored ventilation, respiratory function stable and patient connected to nasal cannula oxygen Cardiovascular status: blood pressure returned to baseline and stable Postop Assessment: no apparent nausea or vomiting Anesthetic complications: no   There were no known notable events for this encounter.   Last Vitals:  Vitals:   07/28/23 1156 07/28/23 1201  BP: 97/72 98/64  Pulse: 77 72  Resp: 15 14  Temp: 36.5 C   SpO2: 93% 94%    Last Pain:  Vitals:   07/28/23 1202  TempSrc:   PainSc: 0-No pain                 [name]

## 2023-07-28 NOTE — Op Note (Signed)
[facility] Patient Name: [name] [name] Procedure Date: 07/28/2023 11:02 AM MRN: [id number] Date of Birth: 12-19-1955 Attending MD: Gennette Pac , MD, [id number] CSN: [id number] Age: 67 Admit Type: Outpatient Procedure:                Colonoscopy Indications:              Screening for colorectal malignant neoplasm Providers:                Gennette Pac, MD, Edrick Kins, RN, Francoise Ceo RN, RN, Dyann Ruddle Referring MD:             Gennette Pac, MD Medicines:                Propofol per Anesthesia Complications:            No immediate complications. Estimated Blood Loss:     Estimated blood loss was minimal. Procedure:                Pre-Anesthesia Assessment:                           - Prior to the procedure, a History and Physical                            was performed, and patient medications and                            allergies were reviewed. The patient's tolerance of                            previous anesthesia was also reviewed. The risks                            and benefits of the procedure and the sedation                            options and risks were discussed with the patient.                            All questions were answered, and informed consent                            was obtained. Prior Anticoagulants: The patient                            last took Xarelto (rivaroxaban) 2 days prior to the                            procedure. ASA Grade Assessment: III - A patient                            with severe systemic disease. After reviewing the  risks and benefits, the patient was deemed in                            satisfactory condition to undergo the procedure.                           After obtaining informed consent, the colonoscope                            was passed under direct vision. Throughout the                            procedure, the  patient's blood pressure, pulse, and                            oxygen saturations were monitored continuously. The                            [id number]) scope was introduced through                            the anus and advanced to the 10 cm into the ileum.                            The colonoscopy was performed without difficulty.                            The patient tolerated the procedure well. The                            quality of the bowel preparation was adequate. The                            ileocecal valve, appendiceal orifice, and rectum                            were photographed. The entire colon was well                            visualized. Scope In: 11:42:44 AM Scope Out: 11:53:40 AM Scope Withdrawal Time: 0 hours 6 minutes 41 seconds  Total Procedure Duration: 0 hours 10 minutes 56 seconds  Findings:      The perianal and digital rectal examinations were normal. Distal 10 cm       of terminal ileum appeared normal.      The colon (entire examined portion) appeared normal. Segmental biopsies       of right and left colon taken to evaluate for chronic diarrhea.      Non-bleeding internal hemorrhoids were found during retroflexion. The       hemorrhoids were mild, small and Grade I (internal hemorrhoids that do       not prolapse). Impression:               - The entire examined colon is normal.                           -  Non-bleeding internal hemorrhoids. Single anal                            papilla                           -Normal-appearing terminal ileum. Status post                            segmental biopsy Moderate Sedation:      Moderate (conscious) sedation was personally administered by an       anesthesia professional. The following parameters were monitored: oxygen       saturation, heart rate, blood pressure, respiratory rate, EKG, adequacy       of pulmonary ventilation, and response to care. Recommendation:            Procedure  Code(s):        --- Professional ---                           [phone], Colonoscopy, flexible; diagnostic, including                            collection of specimen(s) by brushing or washing,                            when performed (separate procedure) Diagnosis Code(s):        --- Professional ---                           Z12.11, Encounter for screening for malignant                            neoplasm of colon                           K64.0, First degree hemorrhoids CPT copyright 2022 [employer]. All rights reserved. The codes documented in this report are preliminary and upon coder review may  be revised to meet current compliance requirements. [name]. Katrisha Segall, MD Gennette Pac, MD 07/28/2023 12:02:15 PM This report has been signed electronically. Number of Addenda: 0

## 2023-07-28 NOTE — Transfer of Care (Signed)
Immediate Anesthesia Transfer of Care Note  Patient: [name] [name]  Procedure(s) Performed: COLONOSCOPY WITH PROPOFOL ESOPHAGOGASTRODUODENOSCOPY (EGD) WITH PROPOFOL BIOPSY  Patient Location: Endoscopy Unit  Anesthesia Type:General  Level of Consciousness: drowsy  Airway & Oxygen Therapy: Patient Spontanous Breathing  Post-op Assessment: Report given to RN and Post -op Vital signs reviewed and stable  Post vital signs: Reviewed and stable  Last Vitals:  Vitals Value Taken Time  BP 97/72 07/28/23 1156  Temp 36.5 C 07/28/23 1156  Pulse 77 07/28/23 1156  Resp 15 07/28/23 1156  SpO2 93 % 07/28/23 1156    Last Pain:  Vitals:   07/28/23 1156  TempSrc: Oral  PainSc:       Patients Stated Pain Goal: 7 (07/28/23 0953)  Complications: No notable events documented.

## 2023-07-28 NOTE — Op Note (Signed)
[facility] Patient Name: [name] [name] Procedure Date: 07/28/2023 11:06 AM MRN: [id number] Date of Birth: 14-Jun-1956 Attending MD: Gennette Pac , MD, [id number] CSN: [id number] Age: 67 Admit Type: Outpatient Procedure:                Upper GI endoscopy Indications:              Epigastric abdominal pain Providers:                Gennette Pac, MD, Francoise Ceo RN, RN, Edrick Kins, RN, Dyann Ruddle Referring MD:             Gennette Pac, MD Medicines:                Propofol per Anesthesia Complications:            No immediate complications. Estimated Blood Loss:     Estimated blood loss was minimal. Procedure:                Pre-Anesthesia Assessment:                           - Prior to the procedure, a History and Physical                            was performed, and patient medications and                            allergies were reviewed. The patient's tolerance of                            previous anesthesia was also reviewed. The risks                            and benefits of the procedure and the sedation                            options and risks were discussed with the patient.                            All questions were answered, and informed consent                            was obtained. Prior Anticoagulants: The patient                            last took Xarelto (rivaroxaban) 2 days prior to the                            procedure. ASA Grade Assessment: III - A patient                            with severe systemic disease. After reviewing the  risks and benefits, the patient was deemed in                            satisfactory condition to undergo the procedure.                           After obtaining informed consent, the endoscope was                            passed under direct vision. Throughout the                            procedure, the patient's blood  pressure, pulse, and                            oxygen saturations were monitored continuously. The                            [id number]-[id number] scope was introduced through the                            mouth, and advanced to the second part of duodenum.                            The upper GI endoscopy was accomplished without                            difficulty. The patient tolerated the procedure                            well. Scope In: 11:33:12 AM Scope Out: 11:38:01 AM Total Procedure Duration: 0 hours 4 minutes 49 seconds  Findings:      The examined esophagus was normal.      Moderate portal hypertensive gastropathy was found in the entire       examined stomach. This was biopsied with a cold forceps for histology.       Estimated blood loss was minimal.      The duodenal bulb and second portion of the duodenum were normal.       Biopsies were taken with a cold forceps for histology. Impression:               - Normal esophagus.                           - Portal hypertensive gastropathy. Biopsied.                           - Normal duodenal bulb and second portion of the                            duodenum. Biopsied. Moderate Sedation:      Moderate (conscious) sedation was personally administered by an       anesthesia professional. The following parameters were monitored: oxygen       saturation, heart rate, blood pressure, respiratory rate, EKG, adequacy  of pulmonary ventilation, and response to care. Recommendation:           - Patient has a contact number available for                            emergencies. The signs and symptoms of potential                            delayed complications were discussed with the                            patient. Return to normal activities tomorrow.                            Written discharge instructions were provided to the                            patient.                           - Advance diet as tolerated.                            - Continue present medications. Follow-up on                            pathology. See colonoscopy report.                           - Return to my office in 1 month. Procedure Code(s):        --- Professional ---                           [phone], Esophagogastroduodenoscopy, flexible,                            transoral; with biopsy, single or multiple Diagnosis Code(s):        --- Professional ---                           K76.6, Portal hypertension                           K31.89, Other diseases of stomach and duodenum                           R10.13, Epigastric pain CPT copyright 2022 [employer]. All rights reserved. The codes documented in this report are preliminary and upon coder review may  be revised to meet current compliance requirements. [name]. [name], MD Gennette Pac, MD 07/28/2023 11:58:12 AM This report has been signed electronically. Number of Addenda: 0

## 2023-07-28 NOTE — Progress Notes (Signed)
[facility] [address] [place] [address]   Clinic Day:  07/28/2023  Referring physician: Letta Median, PA-C  Patient Care Team: Wilfrid Lund, PA as PCP - General (Family Medicine) Marinus Maw, MD as PCP - Cardiology (Cardiology) Jena Gauss [name], MD as Consulting Physician (Gastroenterology) Doreatha Massed, MD as Medical Oncologist (Medical Oncology) Therese Sarah, RN as Oncology Nurse Navigator (Medical Oncology)   ASSESSMENT & PLAN:   Assessment:  1.  Right perinephric soft tissue nodularity: - CTAP done for right-sided abdominal pain (05/29/2023): 2 areas of soft tissue nodularity within the right lower abdomen, measuring 2.3 x 1.8 x 1.9 cm send 1.5 x 1 x 1.3 cm.  These appear to most likely be within the anterior retroperitoneal fat likely just within the inferior aspect of the right perinephric space.  They were not present in 2021.  Stable splenomegaly with estimated volume of 1200 mL.  Stable mild steatosis of the liver without evidence of overt cirrhosis. - PET scan (06/26/2023): No hypermetabolic activity within the liver, pancreas, adrenal glands or spleen.  No hypermetabolic lymph nodes in the abdomen or pelvis.  1.9 cm soft tissue nodule in the lateral right perinephric space without appreciable hypermetabolic with SUV 1.8 beneath the blood pool.  It is not 14 mm perinephric soft tissue nodule non-FDG-avid.  Splenomegaly measuring 17.7 cm without focal hypermetabolism. - No prior history of abdominal surgery or pyelonephritis.  No B symptoms.  2.  Social/family history: - Lives at home with his wife.  He retired after working at [employer].  No chemical exposure.  Quit smoking cigarettes 38 years ago.  Smoked 1 and half pack per day for 17 years. - Mother had lung cancer.  Father had liver cancer.  Maternal uncle had testicular cancer.  Maternal aunt died of metastatic cancer.  Plan:  1.  Right perinephric soft tissue  nodularity: - We have reviewed images of both CT scan and PET scan with the patient and his wife in detail. - He does not have any significant symptoms from it.  He has vague right sided abdominal pain and diarrhea on and off.  I do not believe these nodules are contributing to his symptoms. - I have recommended follow-up CT of the abdomen and pelvis with contrast in 6 months to make sure these nodules are stable.  Will also check labs at that time.  2.  Splenomegaly: - Etiology unclear at this time.  Does not have any significant cirrhosis but has fatty liver.  He does not have any cytopenias based on the most recent CBC from September 9.  Will continue to monitor.   Orders Placed This Encounter  Procedures   CT ABDOMEN PELVIS W CONTRAST    Standing Status:   Future    Standing Expiration Date:   07/27/2024    Order Specific Question:   If indicated for the ordered procedure, I authorize the administration of contrast media per Radiology protocol    Answer:   Yes    Order Specific Question:   Does the patient have a contrast media/X-ray dye allergy?    Answer:   No    Order Specific Question:   Preferred imaging location?    Answer:   [facility]    Order Specific Question:   Release to patient    Answer:   Immediate [1]    Order Specific Question:   If indicated for the ordered procedure, I authorize the administration  of oral contrast media per Radiology protocol    Answer:   Yes   CBC with Differential    Standing Status:   Future    Standing Expiration Date:   07/27/2024   Comprehensive metabolic panel    Standing Status:   Future    Standing Expiration Date:   07/27/2024   Lactate dehydrogenase    Standing Status:   Future    Standing Expiration Date:   07/27/2024      I,Katie Daubenspeck,acting as a scribe for Doreatha Massed, MD.,have documented all relevant documentation on the behalf of Doreatha Massed, MD,as directed by  Doreatha Massed, MD while in  the presence of Doreatha Massed, MD.   I, Doreatha Massed MD, have reviewed the above documentation for accuracy and completeness, and I agree with the above.   Doreatha Massed, MD   11/11/20246:16 PM  CHIEF COMPLAINT/PURPOSE OF CONSULT:   Diagnosis: soft tissue nodules in the right perinephric space   Cancer Staging  No matching staging information was found for the patient.    Prior Therapy: none  Current Therapy: Observation   HISTORY OF PRESENT ILLNESS:   Oncology History   No history exists.      [name] [name] is a 67 y.o. male presenting to clinic today for evaluation of soft tissue disorder at the request of Ermalinda Memos, PA-C.  He was initially referred to GI back in 10/2022 with concerns for nausea and dry heaving with intermittent RUQ abdominal pain. He was scheduled for EGD and colonoscopy in 01/2023, but he was also scheduled for left heart cath and angiography around the same time. He was seen back for follow up in 05/2023 and underwent CT A/P on 05/29/23 showing: two areas of soft tissue nodularity within RLQ abdominal fat, measuring 2.3 cm and 1.5 cm, appearing to most likely be within anterior retroperitoneal fat likely just within inferior aspect of right perinephric space; stable splenomegaly, mild hepatic steatosis, and cholelithiasis.  These findings prompted a staging PET scan on 06/26/23 showing: no findings suspicious for lymphoma; small right perinephric nodules without appreciable hypermetabolism; splenomegaly without focal hypermetabolism.  Today, he states that he is doing well overall. His appetite level is at 100%. His energy level is at 30%.  PAST MEDICAL HISTORY:   Past Medical History: Past Medical History:  Diagnosis Date   ALLERGIC RHINITIS 05/14/2010   Qualifier: Diagnosis of  By: Garnette Czech PA, Dawn     Asthma    Asthma    Atrial fibrillation [facility])    new November 2016   Bipolar 1 disorder [facility])    "put me on this after I got out  of drug rehab 11/18/2005 and couldn't sleep; added anxiety RX and have been sleeping fine since"   Bradycardia    Very limited Wenkebach on event recorder November 2010   CAD (coronary artery disease) 2006   3v CAD >> s/p CABG in [place], patent LIMA to LAD and SVG to diagonal with 40% stenosis 2010 // Nuc 2/14: low risk, EF 56 // LHC 3/16: patent L-LAD, patent S-Dx // Echo 3/16: mild conc LVH, EF 60-65, Gr 1 DD, trivial TR, mild LAE // LHC 12/19: oLAD 100, pLCx 20, S-D1 patent, L-LAD patent, EF 45   Cardiac pacemaker in situ 08/18/2018   CHB (complete heart block) (HCC) 12/02/2014   Chest pain with moderate risk of acute coronary syndrome    Chronic systolic CHF (congestive heart failure) (HCC) 09/15/2018   EF 45 by LHC in 12/19 // Echo  09/2018: inf HK, EF 45-50, mod LAE   Dyslipidemia    mixed    ED (erectile dysfunction)    Ejection fraction    EF 50%, catheterization, September, 2010   //   EF 60-65%, echo, December 03, 2014    Essential hypertension    GERD (gastroesophageal reflux disease)    Gout    History of stomach ulcers 1980's   Hx of CABG    2006, [place]    Memory loss-SVD on MRI March 2015 11/18/2013   Mixed hyperlipidemia    pt states "they took me off q med they had me on for high cholesterol; my good cholesterol is low" (09/13/2015)   Myocardial infarction (HCC) 2006   OSA (obstructive sleep apnea)    patient denies, stated had a sleep study and he was told he did not have     PAF (paroxysmal atrial fibrillation) (HCC) 08/18/2018   Palpitations    Event recorder November, 2010, no significant arrhythmias    Pneumonia 2015   Pneumonia 1990's X 4 in one year   "double pneumonia"   Presence of permanent cardiac pacemaker    Second degree AV block 09/13/2015   Second degree AV block, Mobitz type I 03/10/2013   Patient had Mobitz 1 heart block that was asymptomatic. This resolved off beta blockade while in the hospital March, 2016. Plan to keep the patient off beta blockers.  Patient will be seen in follow-up by Dr. Ladona Ridgel and the [place] office.    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[facility])     Surgical History: Past Surgical History:  Procedure Laterality Date   BACK SURGERY     CARDIAC CATHETERIZATION     "I've had quite a few; don't remember any stents" (09/13/2015)   CORONARY ANGIOPLASTY     CORONARY ARTERY BYPASS GRAFT  2006   "CABG X2, in [place]"   EP IMPLANTABLE DEVICE N/A 09/13/2015   Procedure: Pacemaker Implant;  Surgeon: Hillis Range, MD;  Location: [facility] INVASIVE CV LAB;  Service: Cardiovascular;  Laterality: N/A;   INSERT / REPLACE / REMOVE PACEMAKER  09/13/2015   [employer]   LEFT HEART CATH AND CORS/GRAFTS ANGIOGRAPHY N/A 08/26/2018   Procedure: LEFT HEART CATH AND CORS/GRAFTS ANGIOGRAPHY;  Surgeon: Lennette Bihari, MD;  Location: [facility] INVASIVE CV LAB;  Service: Cardiovascular;  Laterality: N/A;   LEFT HEART CATH AND CORS/GRAFTS ANGIOGRAPHY N/A 01/14/2023   Procedure: LEFT HEART CATH AND CORS/GRAFTS ANGIOGRAPHY;  Surgeon: Corky Crafts, MD;  Location: [facility] INVASIVE CV LAB;  Service: Cardiovascular;  Laterality: N/A;   LEFT HEART CATHETERIZATION WITH CORONARY/GRAFT ANGIOGRAM N/A 12/05/2014   Procedure: LEFT HEART CATHETERIZATION WITH Isabel Caprice;  Surgeon: Peter M Swaziland, MD;  Location: [facility] CATH LAB;  Service: Cardiovascular;  Laterality: N/A;   LEG SURGERY Left 1961   "had 5 boils come up on my leg; they had to cut them out and put a drainage tube in there"   LUMBAR DISC SURGERY  X 3   "ruptured disc"   MAXIMUM ACCESS (MAS)POSTERIOR LUMBAR INTERBODY FUSION (PLIF) 1 LEVEL  ~ 2003   "L4-5; put rods & screws in"    Social History: Social History   Socioeconomic History   Marital status: Married    Spouse name: Not on file   Number of children: Not on file   Years of education: Not on file   Highest education level: Not on file  Occupational History   Not on file  Tobacco Use   Smoking status: Former  Current packs/day:  0.00    Average packs/day: 2.0 packs/day for 17.0 years (34.0 ttl pk-yrs)    Types: Cigarettes    Start date: 03/13/2000    Quit date: 03/13/2017    Years since quitting: 6.3   Smokeless tobacco: Current    Types: Snuff   Tobacco comments:    09/13/2015 "quit chewing tobacco August 2016"  Vaping Use   Vaping status: Never Used  Substance and Sexual Activity   Alcohol use: Not Currently    Comment: 09/13/2015 "got out of alcohol rehab 11/18/2005"   Drug use: Not Currently    Comment: 09/13/2015 "got out of rehab for pills 11/18/2005"   Sexual activity: Yes    Birth control/protection: None  Other Topics Concern   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    Social Determinants of Health   Financial Resource Strain: Not on file  Food Insecurity: No Food Insecurity (07/28/2023)   Hunger Vital Sign    Worried About Running Out of Food in the Last Year: Never true    Ran Out of Food in the Last Year: Never true  Transportation Needs: No Transportation Needs (07/28/2023)   PRAPARE - Administrator, Civil Service (Medical): No    Lack of Transportation (Non-Medical): No  Physical Activity: Not on file  Stress: Not on file  Social Connections: Not on file  Intimate Partner Violence: Not At Risk (07/28/2023)   Humiliation, Afraid, Rape, and Kick questionnaire    Fear of Current or Ex-Partner: No    Emotionally Abused: No    Physically Abused: No    Sexually Abused: No    Family History: Family History  Problem Relation Age of Onset   COPD Mother    Lung cancer Mother    Stroke Mother    Heart attack Mother    Heart disease Father    Heart attack Father    Hyperlipidemia Brother    Hypertension Brother    Hypertension Brother    Hypertension Sister     Current Medications:  Current Outpatient Medications:    allopurinol (ZYLOPRIM) 300 MG tablet, Take 300 mg by mouth every evening., Disp: , Rfl:     colchicine 0.6 MG tablet, Take 0.6 mg by mouth daily as needed (Gout)., Disp: , Rfl: 0   fluticasone (FLONASE) 50 MCG/ACT nasal spray, Place 2 sprays into both nostrils daily., Disp: , Rfl:    isosorbide mononitrate (IMDUR) 30 MG 24 hr tablet, Take 0.5 tablets (15 mg total) by mouth daily., Disp: 45 tablet, Rfl: 3   loratadine (CLARITIN) 10 MG tablet, Take 10 mg by mouth daily., Disp: , Rfl:    Magnesium 300 MG CAPS, Take 300 mg by mouth daily., Disp: , Rfl:    Melatonin 10 MG SUBL, 1 tablet under the tongue and allow to dissolve at bedtime as needed Sublingual at bedtime, Disp: , Rfl:    metoprolol succinate (TOPROL-XL) 25 MG 24 hr tablet, TAKE 1 TABLET BY MOUTH EVERY DAY. MAY TAKE 1/2 TO 1 TABLET EXTRA FOR PALITATTIONS, Disp: 60 tablet, Rfl: 11   Omega-3 Fatty Acids (FISH OIL PO), Take 900 mg by mouth daily., Disp: , Rfl:    omeprazole (PRILOSEC) 40 MG capsule, Take 1 capsule (40 mg total) by mouth daily., Disp: 90 capsule, Rfl: 3   POTASSIUM PO, Take 1 tablet by mouth daily., Disp: , Rfl:    QUEtiapine (SEROQUEL) 300 MG tablet, Take 300 mg  by mouth at bedtime., Disp: , Rfl:    rivaroxaban (XARELTO) 20 MG TABS tablet, Take 1 tablet (20 mg total) by mouth daily with supper., Disp: 30 tablet, Rfl:    rosuvastatin (CRESTOR) 10 MG tablet, TAKE 1 TABLET BY MOUTH EVERY DAY, Disp: 30 tablet, Rfl: 0   silodosin (RAPAFLO) 8 MG CAPS capsule, TAKE 1 CAPSULE (8 MG TOTAL) BY MOUTH AT BEDTIME., Disp: 90 capsule, Rfl: 3   temazepam (RESTORIL) 30 MG capsule, Take 30 mg by mouth at bedtime. , Disp: , Rfl: 5   valsartan-hydrochlorothiazide (DIOVAN HCT) 320-12.5 MG tablet, Take 1 tablet by mouth daily., Disp: 90 tablet, Rfl: 3   nitroGLYCERIN (NITROLINGUAL) 0.4 MG/SPRAY spray, USE 1 SPRAY UNDER THE TONGUE EVERY 5 MINUTES AS NEEDED FOR CHEST PAIN (Patient not taking: Reported on 07/28/2023), Disp: 12 g, Rfl: 3   ondansetron (ZOFRAN) 4 MG tablet, Take 1 tablet (4 mg total) by mouth every 8 (eight) hours as needed for  nausea or vomiting. (Patient not taking: Reported on 07/28/2023), Disp: 30 tablet, Rfl: 1   Allergies: Allergies  Allergen Reactions   Sulfonamide Derivatives Hives   Zolpidem Hives   Zolpidem Tartrate Other (See Comments)    REVIEW OF SYSTEMS:   Review of Systems  Constitutional:  Negative for chills, fatigue and fever.  HENT:   Negative for lump/mass, mouth sores, nosebleeds, sore throat and trouble swallowing.   Eyes:  Negative for eye problems.  Respiratory:  Positive for cough. Negative for shortness of breath.   Cardiovascular:  Negative for chest pain, leg swelling and palpitations.  Gastrointestinal:  Positive for diarrhea and nausea. Negative for abdominal pain, constipation and vomiting.  Genitourinary:  Negative for bladder incontinence, difficulty urinating, dysuria, frequency, hematuria and nocturia.   Musculoskeletal:  Negative for arthralgias, back pain, flank pain, myalgias and neck pain.  Skin:  Negative for itching and rash.  Neurological:  Positive for dizziness and numbness. Negative for headaches.  Hematological:  Does not bruise/bleed easily.  Psychiatric/Behavioral:  Positive for sleep disturbance. Negative for suicidal ideas. The patient is not nervous/anxious.   All other systems reviewed and are negative.    VITALS:   Blood pressure 100/82, pulse 94, temperature 98.1 F (36.7 C), temperature source Oral, resp. rate 16, height 5\' 8"  (1.727 m), weight 223 lb 12.8 oz (101.5 kg), SpO2 100%.  Wt Readings from Last 3 Encounters:  07/28/23 223 lb 12.8 oz (101.5 kg)  06/20/23 228 lb (103.4 kg)  05/26/23 233 lb 6.4 oz (105.9 kg)    Body mass index is 34.03 kg/m.  Performance status (ECOG): 1 - Symptomatic but completely ambulatory  PHYSICAL EXAM:   Physical Exam Vitals and nursing note reviewed. Exam conducted with a chaperone present.  Constitutional:      Appearance: Normal appearance.  Cardiovascular:     Rate and Rhythm: Normal rate and regular  rhythm.     Pulses: Normal pulses.     Heart sounds: Normal heart sounds.  Pulmonary:     Effort: Pulmonary effort is normal.     Breath sounds: Normal breath sounds.  Abdominal:     Palpations: Abdomen is soft. There is no hepatomegaly, splenomegaly or mass.     Tenderness: There is no abdominal tenderness.  Musculoskeletal:     Right lower leg: No edema.     Left lower leg: No edema.  Lymphadenopathy:     Cervical: No cervical adenopathy.     Right cervical: No superficial, deep or posterior cervical adenopathy.  Left cervical: No superficial, deep or posterior cervical adenopathy.     Upper Body:     Right upper body: No supraclavicular or axillary adenopathy.     Left upper body: No supraclavicular or axillary adenopathy.  Neurological:     General: No focal deficit present.     Mental Status: He is alert and oriented to person, place, and time.  Psychiatric:        Mood and Affect: Mood normal.        Behavior: Behavior normal.     LABS:      Latest Ref Rng & Units 05/26/2023    1:54 PM 01/09/2023    9:12 AM 11/25/2021    1:07 AM  CBC  WBC 3.4 - 10.8 x10E3/uL 5.6  7.4  9.8   Hemoglobin 13.0 - 17.7 g/dL 84.6  96.2  95.2   Hematocrit 37.5 - 51.0 % 47.2  45.2  48.4   Platelets 150 - 450 x10E3/uL 229  231  284       Latest Ref Rng & Units 06/25/2023    3:19 PM 05/26/2023    1:54 PM 01/09/2023    9:12 AM  CMP  Glucose 70 - 99 mg/dL 89  841  324   BUN 8 - 27 mg/dL 16  11  14    Creatinine 0.76 - 1.27 mg/dL 4.01  0.27  2.53   Sodium 134 - 144 mmol/L 140  142  142   Potassium 3.5 - 5.2 mmol/L 4.2  5.3  4.3   Chloride 96 - 106 mmol/L 100  102  103   CO2 20 - 29 mmol/L 23  25  25    Calcium 8.6 - 10.2 mg/dL 66.4  9.9  9.9   Total Protein 6.0 - 8.5 g/dL  6.9    Total Bilirubin 0.0 - 1.2 mg/dL  0.4    Alkaline Phos 44 - 121 IU/L  64    AST 0 - 40 IU/L  40    ALT 0 - 44 IU/L  53       No results found for: "CEA1", "CEA" / No results found for: "CEA1", "CEA" No results  found for: "PSA1" No results found for: "QIH474" No results found for: "CAN125"  No results found for: "TOTALPROTELP", "ALBUMINELP", "A1GS", "A2GS", "BETS", "BETA2SER", "GAMS", "MSPIKE", "SPEI" Lab Results  Component Value Date   TIBC 304 11/13/2022   FERRITIN 296 11/13/2022   IRONPCTSAT 36 11/13/2022   No results found for: "LDH"   STUDIES:   ECHOCARDIOGRAM COMPLETE  Result Date: 07/14/2023    ECHOCARDIOGRAM REPORT   Patient Name:   BABY SAUCEMAN Date of Exam: 07/14/2023 Medical Rec #:  [id number]           Height:       67.0 in Accession #:    [id number]          Weight:       228.0 lb Date of Birth:  04-01-1956            BSA:          2.138 m Patient Age:    67 years            BP:           110/64 mmHg Patient Gender: M                   HR:           60 bpm. Exam Location:  [address] Procedure: 2D Echo, 3D Echo, Cardiac Doppler and Color Doppler Indications:    R07.9 Chest Pain  History:        Patient has prior history of Echocardiogram examinations, most                 recent 10/02/2018. Previous Myocardial Infarction and CAD,                 Pacemaker, Abnormal ECG, Prior CABG and Prior Cardiac Surgery,                 Arrythmias:Atrial Fibrillation, Signs/Symptoms:Chest Pain; Risk                 Factors:Dyslipidemia and Hypertension. Palpitations, Tachy-Brady                 Syndrome, 2nd Degree AV Block, Obesity.  Sonographer:    Farrel Conners RDCS Referring Phys: Bernadette Hoit CONTE IMPRESSIONS  1. Left ventricular ejection fraction, by estimation, is 45 to 50%. The left ventricle has mildly decreased function. The left ventricle demonstrates global hypokinesis with septal-lateral dyssynchrony due to RV pacing. Left ventricular diastolic parameters are consistent with Grade I diastolic dysfunction (impaired relaxation).  2. Right ventricular systolic function is mildly reduced. The right ventricular size is normal. There is normal pulmonary artery systolic pressure. The estimated  right ventricular systolic pressure is 26.6 mmHg.  3. Left atrial size was mildly dilated.  4. The mitral valve is normal in structure. No evidence of mitral valve regurgitation. No evidence of mitral stenosis.  5. The aortic valve is tricuspid. Aortic valve regurgitation is not visualized. No aortic stenosis is present.  6. The inferior vena cava is normal in size with greater than 50% respiratory variability, suggesting right atrial pressure of 3 mmHg. FINDINGS  Left Ventricle: Left ventricular ejection fraction, by estimation, is 45 to 50%. The left ventricle has mildly decreased function. The left ventricle demonstrates global hypokinesis. The left ventricular internal cavity size was normal in size. There is  no left ventricular hypertrophy. Left ventricular diastolic parameters are consistent with Grade I diastolic dysfunction (impaired relaxation). Right Ventricle: The right ventricular size is normal. No increase in right ventricular wall thickness. Right ventricular systolic function is mildly reduced. There is normal pulmonary artery systolic pressure. The tricuspid regurgitant velocity is 2.43 m/s, and with an assumed right atrial pressure of 3 mmHg, the estimated right ventricular systolic pressure is 26.6 mmHg. Left Atrium: Left atrial size was mildly dilated. Right Atrium: Right atrial size was normal in size. Pericardium: There is no evidence of pericardial effusion. Mitral Valve: The mitral valve is normal in structure. No evidence of mitral valve regurgitation. No evidence of mitral valve stenosis. Tricuspid Valve: The tricuspid valve is normal in structure. Tricuspid valve regurgitation is trivial. Aortic Valve: The aortic valve is tricuspid. Aortic valve regurgitation is not visualized. No aortic stenosis is present. Pulmonic Valve: The pulmonic valve was normal in structure. Pulmonic valve regurgitation is not visualized. Aorta: The aortic root is normal in size and structure. Venous: The  inferior vena cava is normal in size with greater than 50% respiratory variability, suggesting right atrial pressure of 3 mmHg. IAS/Shunts: No atrial level shunt detected by color flow Doppler. Additional Comments: A device lead is visualized in the right ventricle.  LEFT VENTRICLE PLAX 2D LVIDd:         4.90 cm   Diastology LVIDs:         3.25 cm   LV  e' medial:    6.96 cm/s LV PW:         0.90 cm   LV E/e' medial:  6.5 LV IVS:        0.90 cm   LV e' lateral:   7.94 cm/s LVOT diam:     2.50 cm   LV E/e' lateral: 5.7 LV SV:         109 LV SV Index:   51 LVOT Area:     4.91 cm                           3D Volume EF:                          3D EF:        53 %                          LV EDV:       107 ml                          LV ESV:       50 ml                          LV SV:        57 ml RIGHT VENTRICLE RV Basal diam:  3.30 cm RV S prime:     8.59 cm/s TAPSE (M-mode): 1.5 cm RVSP:           26.6 mmHg LEFT ATRIUM             Index        RIGHT ATRIUM           Index LA diam:        4.80 cm 2.25 cm/m   RA Pressure: 3.00 mmHg LA Vol (A2C):   46.2 ml 21.61 ml/m  RA Area:     16.20 cm LA Vol (A4C):   78.9 ml 36.90 ml/m  RA Volume:   46.80 ml  21.89 ml/m LA Biplane Vol: 65.6 ml 30.68 ml/m  AORTIC VALVE LVOT Vmax:   112.50 cm/s LVOT Vmean:  70.300 cm/s LVOT VTI:    0.223 m  AORTA Ao Root diam: 3.20 cm Ao Asc diam:  3.30 cm MITRAL VALVE               TRICUSPID VALVE MV Area (PHT)  cm         TR Peak grad:   23.6 mmHg MV Decel Time: 257 msec    TR Vmax:        243.00 cm/s MV E velocity: 45.05 cm/s  Estimated RAP:  3.00 mmHg MV A velocity: 75.70 cm/s  RVSP:           26.6 mmHg MV E/A ratio:  0.60                            SHUNTS                            Systemic VTI:  0.22 m  Systemic Diam: 2.50 cm Dalton McleanMD Electronically signed by Wilfred Lacy Signature Date/Time: 07/14/2023/4:55:47 PM    Final

## 2023-07-30 LAB — SURGICAL PATHOLOGY

## 2023-08-01 ENCOUNTER — Encounter (HOSPITAL_COMMUNITY): Payer: Self-pay | Admitting: Internal Medicine

## 2023-08-05 ENCOUNTER — Encounter: Payer: Self-pay | Admitting: Internal Medicine

## 2023-08-25 ENCOUNTER — Other Ambulatory Visit (HOSPITAL_COMMUNITY): Payer: Medicare HMO

## 2023-08-27 NOTE — Progress Notes (Unsigned)
Referring Provider: Wilfrid Lund, PA Primary Care Physician:  Wilfrid Lund, PA Primary GI Physician: Dr. Jena Gauss  No chief complaint on file.   HPI:   [name] [name] is a 67 y.o. male 67 y.o. male with history of CAD, MI, CABG, CHF, heart block s/p pacemaker, PAF on Xarelto, HTN, HLD, bipolar disorder, presenting today for follow-up of fatty liver/elevated LFTs with serologic work-up unrevealing, RUQ abdominal pain, nausea, atypical chest pain, RLQ pain and change in bowel habits with diarrhea.   Last seen in the office 05/26/23.  Reported intermittent RUQ abdominal pain and intermittent nausea not specifically triggered by meals.  Some improvement with omeprazole 40 mg, but still with fairly frequent symptoms.  Also reported intermittent black stool for the last month.  Denied NSAIDs.  Recommended increasing omeprazole to 40 mg twice daily and proceeding with EGD.  Consider referral to general surgery for cholecystectomy if EGD unrevealing and symptoms persist.  Patient also with history of constipation, but now with 3 loose to watery bowel movements daily and intermittent RLQ abdominal pain.  Plan to update labs, arrange CT, and proceed with colonoscopy.  Labs completed 05/26/2023: CBC within normal limits.  Celiac screen negative.  ALT slightly elevated at 53.  C. difficile negative, GI panel negative.  CT A/P with contrast with 2 areas of soft tissue nodularity within the right lower abdominal fat measuring roughly 2.3 x 1.8 x 1.9 cm and 1.5 x 1.0 x 1.3 cm.  This appeared to be most likely in the anterior retroperitoneal fat just within the inferior aspect of the right perinephric space.  Recommended PET for further evaluation.  Stable Paddock steatosis with no overt cirrhosis.  Stable splenomegaly.  Stable cholelithiasis.  Moderate prostate enlargement.  Circumferential bladder wall thickening consistent with chronic outlet obstruction.  PET scan 06/26/2023 with no findings suspicious  for lymphoma, small right perinephric nodules without hypermetabolism.  Reviewed with Dr. Jena Gauss.  CT findings may be incidental.  Recommended seeing oncology to get them to weigh in.  Patient saw Dr. Ellin Saba 07/28/2023.  Recommended follow-up CT A/P with contrast in 6 months to ensure nodule stability.   Colonoscopy 07/28/2023: Normal examined colon, nonbleeding internal hemorrhoids, single anal papilla.  Segmental biopsies were taken and benign.  Recommended repeat colonoscopy in 10 years.  EGD 07/28/2023: Normal esophagus, portal hypertensive gastropathy biopsied, normal examined duodenum biopsied.  Pathology with gastric oxyntic mucosa with PPI effect.   Today:     Elevated ALT/Fatty liver/PHG***:   Past Medical History:  Diagnosis Date   ALLERGIC RHINITIS 05/14/2010   Qualifier: Diagnosis of  By: Garnette Czech PA, Dawn     Asthma    Asthma    Atrial fibrillation [facility]    new November 2016   Bipolar 1 disorder [facility])    "put me on this after I got out of drug rehab 11/18/2005 and couldn't sleep; added anxiety RX and have been sleeping fine since"   Bradycardia    Very limited Wenkebach on event recorder November 2010   CAD (coronary artery disease) 2006   3v CAD >> s/p CABG in [place], patent LIMA to LAD and SVG to diagonal with 40% stenosis 2010 // Nuc 2/14: low risk, EF 56 // LHC 3/16: patent L-LAD, patent S-Dx // Echo 3/16: mild conc LVH, EF 60-65, Gr 1 DD, trivial TR, mild LAE // LHC 12/19: oLAD 100, pLCx 20, S-D1 patent, L-LAD patent, EF 45   Cardiac pacemaker in situ 08/18/2018   CHB (complete  heart block) (HCC) 12/02/2014   Chest pain with moderate risk of acute coronary syndrome    Chronic systolic CHF (congestive heart failure) (HCC) 09/15/2018   EF 45 by LHC in 12/19 // Echo 09/2018: inf HK, EF 45-50, mod LAE   Dyslipidemia    mixed    ED (erectile dysfunction)    Ejection fraction    EF 50%, catheterization, September, 2010   //   EF 60-65%, echo, December 03, 2014     Essential hypertension    GERD (gastroesophageal reflux disease)    Gout    History of stomach ulcers 1980's   Hx of CABG    2006, [place]    Memory loss-SVD on MRI March 2015 11/18/2013   Mixed hyperlipidemia    pt states "they took me off q med they had me on for high cholesterol; my good cholesterol is low" (09/13/2015)   Myocardial infarction (HCC) 2006   OSA (obstructive sleep apnea)    patient denies, stated had a sleep study and he was told he did not have     PAF (paroxysmal atrial fibrillation) (HCC) 08/18/2018   Palpitations    Event recorder November, 2010, no significant arrhythmias    Pneumonia 2015   Pneumonia 1990's X 4 in one year   "double pneumonia"   Presence of permanent cardiac pacemaker    Second degree AV block 09/13/2015   Second degree AV block, Mobitz type I 03/10/2013   Patient had Mobitz 1 heart block that was asymptomatic. This resolved off beta blockade while in the hospital March, 2016. Plan to keep the patient off beta blockers. Patient will be seen in follow-up by Dr. Ladona Ridgel and the [place] office.    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[facility])     Past Surgical History:  Procedure Laterality Date   BACK SURGERY     BIOPSY  07/28/2023   Procedure: BIOPSY;  Surgeon: Corbin Ade, MD;  Location: [facility];  Service: Endoscopy;;   CARDIAC CATHETERIZATION     "I've had quite a few; don't remember any stents" (09/13/2015)   COLONOSCOPY WITH PROPOFOL N/A 07/28/2023   Procedure: COLONOSCOPY WITH PROPOFOL;  Surgeon: Corbin Ade, MD;  Location: [facility];  Service: Endoscopy;  Laterality: N/A;  1:15 pm, asa 3   CORONARY ANGIOPLASTY     CORONARY ARTERY BYPASS GRAFT  2006   "CABG X2, in [place]"   EP IMPLANTABLE DEVICE N/A 09/13/2015   Procedure: Pacemaker Implant;  Surgeon: Hillis Range, MD;  Location[facility] INVASIVE CV LAB;  Service: Cardiovascular;  Laterality: N/A;   ESOPHAGOGASTRODUODENOSCOPY (EGD) WITH PROPOFOL N/A 07/28/2023   Procedure:  ESOPHAGOGASTRODUODENOSCOPY (EGD) WITH PROPOFOL;  Surgeon: Corbin Ade, MD;  Location: [facility];  Service: Endoscopy;  Laterality: N/A;   INSERT / REPLACE / REMOVE PACEMAKER  09/13/2015   [employer]   LEFT HEART CATH AND CORS/GRAFTS ANGIOGRAPHY N/A 08/26/2018   Procedure: LEFT HEART CATH AND CORS/GRAFTS ANGIOGRAPHY;  Surgeon: Lennette Bihari, MD;  Location: [facility] INVASIVE CV LAB;  Service: Cardiovascular;  Laterality: N/A;   LEFT HEART CATH AND CORS/GRAFTS ANGIOGRAPHY N/A 01/14/2023   Procedure: LEFT HEART CATH AND CORS/GRAFTS ANGIOGRAPHY;  Surgeon: Corky Crafts, MD;  Location: [facility] INVASIVE CV LAB;  Service: Cardiovascular;  Laterality: N/A;   LEFT HEART CATHETERIZATION WITH CORONARY/GRAFT ANGIOGRAM N/A 12/05/2014   Procedure: LEFT HEART CATHETERIZATION WITH Isabel Caprice;  Surgeon: Peter M Swaziland, MD;  Location: [facility] CATH LAB;  Service: Cardiovascular;  Laterality: N/A;   LEG SURGERY  Left 1961   "had 5 boils come up on my leg; they had to cut them out and put a drainage tube in there"   LUMBAR DISC SURGERY  X 3   "ruptured disc"   MAXIMUM ACCESS (MAS)POSTERIOR LUMBAR INTERBODY FUSION (PLIF) 1 LEVEL  ~ 2003   "L4-5; put rods & screws in"    Current Outpatient Medications  Medication Sig Dispense Refill   allopurinol (ZYLOPRIM) 300 MG tablet Take 300 mg by mouth every evening.     colchicine 0.6 MG tablet Take 0.6 mg by mouth daily as needed (Gout).  0   fluticasone (FLONASE) 50 MCG/ACT nasal spray Place 2 sprays into both nostrils daily.     isosorbide mononitrate (IMDUR) 30 MG 24 hr tablet Take 0.5 tablets (15 mg total) by mouth daily. 45 tablet 3   loratadine (CLARITIN) 10 MG tablet Take 10 mg by mouth daily.     Magnesium 300 MG CAPS Take 300 mg by mouth daily.     Melatonin 10 MG SUBL 1 tablet under the tongue and allow to dissolve at bedtime as needed Sublingual at bedtime     metoprolol succinate (TOPROL-XL) 25 MG 24 hr tablet TAKE 1 TABLET BY MOUTH EVERY DAY. MAY  TAKE 1/2 TO 1 TABLET EXTRA FOR PALITATTIONS 60 tablet 11   nitroGLYCERIN (NITROLINGUAL) 0.4 MG/SPRAY spray USE 1 SPRAY UNDER THE TONGUE EVERY 5 MINUTES AS NEEDED FOR CHEST PAIN (Patient not taking: Reported on 07/28/2023) 12 g 3   Omega-3 Fatty Acids (FISH OIL PO) Take 900 mg by mouth daily.     omeprazole (PRILOSEC) 40 MG capsule Take 1 capsule (40 mg total) by mouth daily. 90 capsule 3   ondansetron (ZOFRAN) 4 MG tablet Take 1 tablet (4 mg total) by mouth every 8 (eight) hours as needed for nausea or vomiting. (Patient not taking: Reported on 07/28/2023) 30 tablet 1   POTASSIUM PO Take 1 tablet by mouth daily.     QUEtiapine (SEROQUEL) 300 MG tablet Take 300 mg by mouth at bedtime.     rivaroxaban (XARELTO) 20 MG TABS tablet Take 1 tablet (20 mg total) by mouth daily with supper. 30 tablet    rosuvastatin (CRESTOR) 10 MG tablet TAKE 1 TABLET BY MOUTH EVERY DAY 30 tablet 0   silodosin (RAPAFLO) 8 MG CAPS capsule TAKE 1 CAPSULE (8 MG TOTAL) BY MOUTH AT BEDTIME. 90 capsule 3   temazepam (RESTORIL) 30 MG capsule Take 30 mg by mouth at bedtime.   5   valsartan-hydrochlorothiazide (DIOVAN HCT) 320-12.5 MG tablet Take 1 tablet by mouth daily. 90 tablet 3   No current facility-administered medications for this visit.    Allergies as of 08/28/2023 - Review Complete 07/28/2023  Allergen Reaction Noted   Sulfonamide derivatives Hives    Zolpidem Hives 10/30/2022   Zolpidem tartrate Other (See Comments) 05/04/2021    Family History  Problem Relation Age of Onset   COPD Mother    Lung cancer Mother    Stroke Mother    Heart attack Mother    Heart disease Father    Heart attack Father    Hyperlipidemia Brother    Hypertension Brother    Hypertension Brother    Hypertension Sister     Social History   Socioeconomic History   Marital status: Married    Spouse name: Not on file   Number of children: Not on file   Years of education: Not on file   Highest education level: Not on file  Occupational History   Not on file  Tobacco Use   Smoking status: Former    Current packs/day: 0.00    Average packs/day: 2.0 packs/day for 17.0 years (34.0 ttl pk-yrs)    Types: Cigarettes    Start date: 03/13/2000    Quit date: 03/13/2017    Years since quitting: 6.4   Smokeless tobacco: Current    Types: Snuff   Tobacco comments:    09/13/2015 "quit chewing tobacco August 2016"  Vaping Use   Vaping status: Never Used  Substance and Sexual Activity   Alcohol use: Not Currently    Comment: 09/13/2015 "got out of alcohol rehab 11/18/2005"   Drug use: Not Currently    Comment: 09/13/2015 "got out of rehab for pills 11/18/2005"   Sexual activity: Yes    Birth control/protection: None  Other Topics Concern   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    Social Determinants of Health   Financial Resource Strain: Not on file  Food Insecurity: No Food Insecurity (07/28/2023)   Hunger Vital Sign    Worried About Running Out of Food in the Last Year: Never true    Ran Out of Food in the Last Year: Never true  Transportation Needs: No Transportation Needs (07/28/2023)   PRAPARE - Administrator, Civil Service (Medical): No    Lack of Transportation (Non-Medical): No  Physical Activity: Not on file  Stress: Not on file  Social Connections: Not on file    Review of Systems: Gen: Denies fever, chills, anorexia. Denies fatigue, weakness, weight loss.  CV: Denies chest pain, palpitations, syncope, peripheral edema, and claudication. Resp: Denies dyspnea at rest, cough, wheezing, coughing up blood, and pleurisy. GI: Denies vomiting blood, jaundice, and fecal incontinence.   Denies dysphagia or odynophagia. Derm: Denies rash, itching, dry skin Psych: Denies depression, anxiety, memory loss, confusion. No homicidal or suicidal ideation.  Heme: Denies bruising, bleeding, and enlarged lymph nodes.  Physical Exam: There  were no vitals taken for this visit. General:   Alert and oriented. No distress noted. Pleasant and cooperative.  Head:  Normocephalic and atraumatic. Eyes:  Conjuctiva clear without scleral icterus. Heart:  S1, S2 present without murmurs appreciated. Lungs:  Clear to auscultation bilaterally. No wheezes, rales, or rhonchi. No distress.  Abdomen:  +BS, soft, non-tender and non-distended. No rebound or guarding. No HSM or masses noted. Msk:  Symmetrical without gross deformities. Normal posture. Extremities:  Without edema. Neurologic:  Alert and  oriented x4 Psych:  Normal mood and affect.    Assessment:     Plan:  ***   Ermalinda Memos, PA-C [facility] 08/28/2023

## 2023-08-28 ENCOUNTER — Ambulatory Visit: Payer: Medicare HMO | Admitting: Gastroenterology

## 2023-08-28 ENCOUNTER — Encounter: Payer: Self-pay | Admitting: Gastroenterology

## 2023-08-28 VITALS — BP 114/77 | HR 73 | Temp 98.1°F | Ht 67.0 in | Wt 221.0 lb

## 2023-08-28 DIAGNOSIS — K760 Fatty (change of) liver, not elsewhere classified: Secondary | ICD-10-CM

## 2023-08-28 DIAGNOSIS — R10811 Right upper quadrant abdominal tenderness: Secondary | ICD-10-CM

## 2023-08-28 DIAGNOSIS — K8020 Calculus of gallbladder without cholecystitis without obstruction: Secondary | ICD-10-CM

## 2023-08-28 DIAGNOSIS — K3189 Other diseases of stomach and duodenum: Secondary | ICD-10-CM

## 2023-08-28 DIAGNOSIS — R7989 Other specified abnormal findings of blood chemistry: Secondary | ICD-10-CM | POA: Diagnosis not present

## 2023-08-28 DIAGNOSIS — R197 Diarrhea, unspecified: Secondary | ICD-10-CM

## 2023-08-28 NOTE — Patient Instructions (Addendum)
Have labs at [facility].   Start benefiber 3 teaspoons daily for 2 weeks, then increase to twice daily as long as you are tolerating it well. This is to help with diarrhea.   Continue omeprazole 40 mg daily.   Instructions for fatty liver: Recommend 1-2# weight loss per week until ideal body weight through exercise & diet. Low fat/cholesterol diet.   Avoid sweets, sodas, fruit juices, sweetened beverages like tea, etc. Gradually increase exercise from 15 min daily up to 1 hr per day 5 days/week. Continue to avid alcohol use.  We will plan to see you back in 6 moths or sooner if needed.   Ermalinda Memos, PA-C [facility]

## 2023-08-29 LAB — NASH FIBROSURE(R) PLUS

## 2023-08-30 LAB — NASH FIBROSURE(R) PLUS
ALPHA 2-MACROGLOBULINS, QN: 191 mg/dL (ref 110–276)
ALT (SGPT) P5P: 57 IU/L — ABNORMAL HIGH (ref 0–55)
AST (SGOT) P5P: 48 IU/L — ABNORMAL HIGH (ref 0–40)
Apolipoprotein A-1: 114 mg/dL (ref 101–178)
Bilirubin, Total: 0.9 mg/dL (ref 0.0–1.2)
Cholesterol, Total: 97 mg/dL — ABNORMAL LOW (ref 100–199)
Fibrosis Score: 0.51 — ABNORMAL HIGH (ref 0.00–0.21)
GGT: 38 IU/L (ref 0–65)
Glucose: 112 mg/dL — ABNORMAL HIGH (ref 70–99)
Haptoglobin: 128 mg/dL (ref 32–363)
NASH Score: 0.89 — ABNORMAL HIGH (ref 0.00–0.25)
Steatosis Score: 0.68 — ABNORMAL HIGH (ref 0.00–0.40)
Triglycerides: 199 mg/dL — ABNORMAL HIGH (ref 0–149)

## 2023-08-30 LAB — HEPATIC FUNCTION PANEL
ALT: 48 [IU]/L — ABNORMAL HIGH (ref 0–44)
AST: 43 [IU]/L — ABNORMAL HIGH (ref 0–40)
Albumin: 5.1 g/dL — ABNORMAL HIGH (ref 3.9–4.9)
Alkaline Phosphatase: 66 [IU]/L (ref 44–121)
Bilirubin Total: 0.9 mg/dL (ref 0.0–1.2)
Bilirubin, Direct: 0.3 mg/dL (ref 0.00–0.40)
Total Protein: 7.2 g/dL (ref 6.0–8.5)

## 2023-08-30 LAB — ENHANCED LIVER FIBROSIS (ELF): ELF(TM) Score: 9.82 — ABNORMAL HIGH (ref <9.80)

## 2023-09-22 ENCOUNTER — Ambulatory Visit (INDEPENDENT_AMBULATORY_CARE_PROVIDER_SITE_OTHER): Payer: Medicare HMO

## 2023-09-22 DIAGNOSIS — I442 Atrioventricular block, complete: Secondary | ICD-10-CM

## 2023-09-22 LAB — CUP PACEART REMOTE DEVICE CHECK
Battery Remaining Longevity: 19 mo
Battery Voltage: 2.91 V
Brady Statistic AP VP Percent: 21.48 %
Brady Statistic AP VS Percent: 0 %
Brady Statistic AS VP Percent: 78.45 %
Brady Statistic AS VS Percent: 0.06 %
Brady Statistic RA Percent Paced: 21.37 %
Brady Statistic RV Percent Paced: 99.9 %
Date Time Interrogation Session: 20250106121545
Implantable Lead Connection Status: 753985
Implantable Lead Connection Status: 753985
Implantable Lead Implant Date: 20161228
Implantable Lead Implant Date: 20161228
Implantable Lead Location: 753859
Implantable Lead Location: 753860
Implantable Lead Model: 5076
Implantable Lead Model: 5076
Implantable Pulse Generator Implant Date: 20161228
Lead Channel Impedance Value: 304 Ohm
Lead Channel Impedance Value: 361 Ohm
Lead Channel Impedance Value: 399 Ohm
Lead Channel Impedance Value: 437 Ohm
Lead Channel Pacing Threshold Amplitude: 0.875 V
Lead Channel Pacing Threshold Amplitude: 1.125 V
Lead Channel Pacing Threshold Pulse Width: 0.4 ms
Lead Channel Pacing Threshold Pulse Width: 0.4 ms
Lead Channel Sensing Intrinsic Amplitude: 0.875 mV
Lead Channel Sensing Intrinsic Amplitude: 0.875 mV
Lead Channel Sensing Intrinsic Amplitude: 10.5 mV
Lead Channel Sensing Intrinsic Amplitude: 10.5 mV
Lead Channel Setting Pacing Amplitude: 2.5 V
Lead Channel Setting Pacing Amplitude: 2.5 V
Lead Channel Setting Pacing Pulse Width: 0.4 ms
Lead Channel Setting Sensing Sensitivity: 2.8 mV
Zone Setting Status: 755011
Zone Setting Status: 755011

## 2023-09-27 ENCOUNTER — Other Ambulatory Visit: Payer: Self-pay | Admitting: Urology

## 2023-10-24 NOTE — Progress Notes (Signed)
 Cardiology Office Note:   Date:  10/27/2023  ID:  [name] [name], DOB [birth year], MRN [id number] PCP:  Sinda Duel, PA  [facility] Providers Cardiologist:  Alyssa Backbone, MD Referring MD: Sinda Duel, [place]  Chief Complaint/Reason for Referral: Cardiology follow-up for coronary artery disease ASSESSMENT:    1. Chest pain of uncertain etiology   2. S/P CABG x 2   3. Essential hypertension   4. Hyperlipidemia LDL goal <70   5. Aortic atherosclerosis (HCC)   6. PAF (paroxysmal atrial fibrillation) (HCC)   7. Secondary hypercoagulable state (HCC)   8. Presence of permanent cardiac pacemaker   9. CKD (chronic kidney disease) stage 2, GFR 60-89 ml/min   10. BMI 35.0-35.9,adult     PLAN:   In order of problems listed above: Chest pain:  Obtain PET stress to assess for CMD.  If positive, will pivot from nitrate meds and increase ARB, consider CCBs, increase statin.  If PET stress negative, then continue nitrates for empiric therapy for coronary vasospam. Coronary artery disease: Continue Xarelto  instead of aspirin , Crestor , and strict blood pressure control. Hypertension: Blood pressure is well-controlled today. Hyperlipidemia: Continue rosuvastatin  10 mg daily check lipid panel, LFTs, LP(a) today. Aortic atherosclerosis: Continue aspirin  81 mg, Crestor  10 mg, and strict blood pressure control. Paroxysmal atrial fibrillation: Continue Xarelto  20 mg and Toprol  25 mg daily Secondary hypercoagulable state: Continue Xarelto  20 mg daily; no severe bleeding. Status post permanent pacemaker: Being followed by EP. CKD stage II: Continue Diovan  320/12.5 mg and start Jardiance  10 mg daily. Elevated BMI: Diet and exercise modification.  HbA1c in August 2024 was 5.5           Dispo:  Return in about 6 months (around 04/25/2024).      Medication Adjustments/Labs and Tests Ordered: Current medicines are reviewed at length with the patient today.  Concerns regarding medicines are  outlined above.  The following changes have been made:     Labs/tests ordered: No orders of the defined types were placed in this encounter.   Medication Changes: No orders of the defined types were placed in this encounter.   Current medicines are reviewed at length with the patient today.  The patient does not have concerns regarding medicines.  I spent 33 minutes reviewing all clinical data during and prior to this visit including all relevant imaging studies, laboratories, clinical information from other health systems and prior notes from both Cardiology and other specialties, interviewing the patient, conducting a complete physical examination, and coordinating care in order to formulate a comprehensive and personalized evaluation and treatment plan.   History of Present Illness:      FOCUSED PROBLEM LIST:   CAD CABG LIMA to LAD and vein graft to diagonal 2006 Patent LIMA to LAD and vein graft to diagonal with mild disease elsewhere cath 2024 Hypertension Hyperlipidemia Aortic atherosclerosis CT abdomen pelvis 2024 Sinus node dysfunction Status post permanent pacemaker PAF CV 2 score 3 On Xarelto  BMI 35 CKD stage II  2/25: The patient is here for routine follow-up.  He was last seen in October.  This was following coronary and bypass angiography demonstrating patent grafts and patent circumflex, RCA, and ramus circulations.  At that visit he had some episodes of atypical chest pain that did not seem exertional.  He had no exertional dyspnea, orthopnea, paroxysmal atrial dyspnea, or palpitations.  The patient is quite limited from chronic back pain issues.  He has problems with back pain even laying down.  Interestingly he still develops an atypical type chest pain.  It can happen with rest and with exertion.  Rubbing on his chest seems to help.  He was started on Imdur  and this has helped somewhat as well however he continues to have to use nitroglycerin  maybe 3 or 4 times a  week.  This reliably helps the discomfort.  It does not seem to be associated with food intake.  He otherwise has had no issues with his Xarelto  in terms of bleeding.  He has had no signs or symptoms of stroke.  He denies any routine or regular exertional angina.  His shortness of breath is relatively unchanged.  He has had no presyncope or syncope.  He denies any significant palpitations.          Current Medications: Current Meds  Medication Sig   allopurinol  (ZYLOPRIM ) 300 MG tablet Take 300 mg by mouth every evening.   colchicine  0.6 MG tablet Take 0.6 mg by mouth daily as needed (Gout).   fluticasone (FLONASE) 50 MCG/ACT nasal spray Place 2 sprays into both nostrils daily.   loratadine  (CLARITIN ) 10 MG tablet Take 10 mg by mouth daily.   Magnesium 300 MG CAPS Take 300 mg by mouth daily.   Melatonin 10 MG SUBL 1 tablet under the tongue and allow to dissolve at bedtime as needed Sublingual at bedtime   metoprolol  succinate (TOPROL -XL) 25 MG 24 hr tablet TAKE 1 TABLET BY MOUTH EVERY DAY. MAY TAKE 1/2 TO 1 TABLET EXTRA FOR PALITATTIONS   nitroGLYCERIN  (NITROLINGUAL ) 0.4 MG/SPRAY spray USE 1 SPRAY UNDER THE TONGUE EVERY 5 MINUTES AS NEEDED FOR CHEST PAIN   Omega-3 Fatty Acids (FISH OIL PO) Take 900 mg by mouth daily.   omeprazole  (PRILOSEC) 40 MG capsule Take 1 capsule (40 mg total) by mouth daily.   ondansetron  (ZOFRAN ) 4 MG tablet Take 1 tablet (4 mg total) by mouth every 8 (eight) hours as needed for nausea or vomiting.   POTASSIUM PO Take 1 tablet by mouth daily.   QUEtiapine  (SEROQUEL ) 300 MG tablet Take 300 mg by mouth at bedtime.   rivaroxaban  (XARELTO ) 20 MG TABS tablet Take 1 tablet (20 mg total) by mouth daily with supper.   rosuvastatin  (CRESTOR ) 10 MG tablet TAKE 1 TABLET BY MOUTH EVERY DAY   silodosin  (RAPAFLO ) 8 MG CAPS capsule TAKE 1 CAPSULE BY MOUTH EVERYDAY AT BEDTIME   temazepam (RESTORIL) 30 MG capsule Take 30 mg by mouth at bedtime.    valsartan -hydrochlorothiazide   (DIOVAN  HCT) 320-12.5 MG tablet Take 1 tablet by mouth daily.     Review of Systems:   Please see the history of present illness.    All other systems reviewed and are negative.     EKGs/Labs/Other Test Reviewed:   EKG: EKG from October 2024 demonstrates occasional sinus rhythm with IVCD and AV pacing  EKG Interpretation Date/Time:    Ventricular Rate:    PR Interval:    QRS Duration:    QT Interval:    QTC Calculation:   R Axis:      Text Interpretation:           Risk Assessment/Calculations:    CHA2DS2-VASc Score = 3   This indicates a 3.2% annual risk of stroke. The patient's score is based upon: CHF History: 1 HTN History: 1 Diabetes History: 0 Stroke History: 0 Vascular Disease History: 1 Age Score: 0 Gender Score: 0         Physical Exam:   VS:  BP  110/74   Pulse 66   Ht 5\' 7"  (1.702 m)   Wt 225 lb 3.2 oz (102.2 kg)   SpO2 97%   BMI 35.27 kg/m        Wt Readings from Last 3 Encounters:  10/27/23 225 lb 3.2 oz (102.2 kg)  08/28/23 221 lb (100.2 kg)  07/28/23 223 lb 12.8 oz (101.5 kg)      GENERAL:  No apparent distress, AOx3 HEENT:  No carotid bruits, +2 carotid impulses, no scleral icterus CAR: RRR no murmurs, gallops, rubs, or thrills RES:  Clear to auscultation bilaterally ABD:  Soft, nontender, nondistended, positive bowel sounds x 4 VASC:  +2 radial pulses, +2 carotid pulses NEURO:  CN 2-12 grossly intact; motor and sensory grossly intact PSYCH:  No active depression or anxiety EXT:  No edema, ecchymosis, or cyanosis  Signed, Phyllistine Domingos K Chelse Matas, MD  10/27/2023 3:15 PM    [facility] [address] [place], [place], [place]  [address] Phone: [phone]; Fax: [phone]   Note:  This document was prepared using Dragon voice recognition software and may include unintentional dictation errors.

## 2023-10-27 ENCOUNTER — Other Ambulatory Visit: Payer: Self-pay

## 2023-10-27 ENCOUNTER — Other Ambulatory Visit (HOSPITAL_COMMUNITY): Payer: Self-pay

## 2023-10-27 ENCOUNTER — Encounter: Payer: Self-pay | Admitting: Internal Medicine

## 2023-10-27 ENCOUNTER — Ambulatory Visit: Payer: Medicare HMO | Attending: Internal Medicine | Admitting: Internal Medicine

## 2023-10-27 VITALS — BP 110/74 | HR 66 | Ht 67.0 in | Wt 225.2 lb

## 2023-10-27 DIAGNOSIS — I10 Essential (primary) hypertension: Secondary | ICD-10-CM

## 2023-10-27 DIAGNOSIS — Z950 Presence of cardiac pacemaker: Secondary | ICD-10-CM

## 2023-10-27 DIAGNOSIS — I700 Atherosclerosis of aorta: Secondary | ICD-10-CM

## 2023-10-27 DIAGNOSIS — Z6835 Body mass index (BMI) 35.0-35.9, adult: Secondary | ICD-10-CM

## 2023-10-27 DIAGNOSIS — E785 Hyperlipidemia, unspecified: Secondary | ICD-10-CM | POA: Diagnosis not present

## 2023-10-27 DIAGNOSIS — R079 Chest pain, unspecified: Secondary | ICD-10-CM

## 2023-10-27 DIAGNOSIS — I480 Paroxysmal atrial fibrillation: Secondary | ICD-10-CM

## 2023-10-27 DIAGNOSIS — Z951 Presence of aortocoronary bypass graft: Secondary | ICD-10-CM

## 2023-10-27 DIAGNOSIS — N182 Chronic kidney disease, stage 2 (mild): Secondary | ICD-10-CM

## 2023-10-27 DIAGNOSIS — D6869 Other thrombophilia: Secondary | ICD-10-CM

## 2023-10-27 MED ORDER — EMPAGLIFLOZIN 10 MG PO TABS: 10.0000 mg | ORAL_TABLET | Freq: Every day | ORAL | Status: DC

## 2023-10-27 MED ORDER — EMPAGLIFLOZIN 10 MG PO TABS: 10.0000 mg | ORAL_TABLET | Freq: Every day | ORAL | 11 refills | Status: DC

## 2023-10-27 NOTE — Patient Instructions (Addendum)
 Medication Instructions:  Your physician has recommended you make the following change in your medication:  1.) start Jardiance  10 mg - take one tablet daily before breakfast  *If you need a refill on your cardiac medications before your next appointment, please call your pharmacy*   Lab Work: Today: lipids, liver, Lp(a) - you may use any [facility]  If you have labs (blood work) drawn today and your tests are completely normal, you will receive your results only by: MyChart Message (if you have MyChart) OR A paper copy in the mail If you have any lab test that is abnormal or we need to change your treatment, we will call you to review the results.   Testing/Procedures:    Please report to Radiology at the [facility] Main Entrance 30 minutes early for your test.  [address] [place], [place] [address]  How to Prepare for Your Cardiac PET/CT Stress Test:  Nothing to eat or drink, except water , 3 hours prior to arrival time.  NO caffeine/decaffeinated products, or chocolate 12 hours prior to arrival. (Please note decaffeinated beverages (teas/coffees) still contain caffeine).  If you have caffeine within 12 hours prior, the test will need to be rescheduled.  Medication instructions: Do not take erectile dysfunction medications for 72 hours prior to test (sildenafil, tadalafil) Do not take nitrates (isosorbide  mononitrate, Ranexa) the day before or day of test Do not take tamsulosin the day before or morning of test Hold theophylline containing medications for 12 hours. Hold Dipyridamole 48 hours prior to the test.  Diabetic Preparation: If able to eat breakfast prior to 3 hour fasting, you may take all medications, including your insulin. Do not worry if you miss your breakfast dose of insulin - start at your next meal. If you do not eat prior to 3 hour fast-Hold all diabetes (oral and insulin) medications. Patients who wear a continuous glucose monitor MUST remove  the device prior to scanning.  You may take your remaining medications with water .  NO perfume, cologne or lotion on chest or abdomen area. FEMALES - Please avoid wearing dresses to this appointment.  Total time is 1 to 2 hours; you may want to bring reading material for the waiting time.  IF YOU THINK YOU MAY BE PREGNANT, OR ARE NURSING PLEASE INFORM THE TECHNOLOGIST.  In preparation for your appointment, medication and supplies will be purchased.  Appointment availability is limited, so if you need to cancel or reschedule, please call the Radiology Department Scheduler at [phone] 24 hours in advance to avoid a cancellation fee of $100.00  What to Expect When you Arrive:  Once you arrive and check in for your appointment, you will be taken to a preparation room within the Radiology Department.  A technologist or Nurse will obtain your medical history, verify that you are correctly prepped for the exam, and explain the procedure.  Afterwards, an IV will be started in your arm and electrodes will be placed on your skin for EKG monitoring during the stress portion of the exam. Then you will be escorted to the PET/CT scanner.  There, staff will get you positioned on the scanner and obtain a blood pressure and EKG.  During the exam, you will continue to be connected to the EKG and blood pressure machines.  A small, safe amount of a radioactive tracer will be injected in your IV to obtain a series of pictures of your heart along with an injection of a stress agent.  After your Exam:  It is recommended that you eat a meal and drink a caffeinated beverage to counter act any effects of the stress agent.  Drink plenty of fluids for the remainder of the day and urinate frequently for the first couple of hours after the exam.  Your doctor will inform you of your test results within 7-10 business days.  For more information and frequently asked questions, please visit our  website: [email or web address]  For questions about your test or how to prepare for your test, please call: Cardiac Imaging Nurse Navigators Office: [phone]    Follow-Up: At [facility], you and your health needs are our priority.  As part of our continuing mission to provide you with exceptional heart care, we have created designated Provider Care Teams.  These Care Teams include your primary Cardiologist (physician) and Advanced Practice Providers (APPs -  Physician Assistants and Nurse Practitioners) who all work together to provide you with the care you need, when you need it.   Your next appointment:   6 month(s)  Provider:   Marlyse Single, PA-C

## 2023-10-29 NOTE — Progress Notes (Signed)
Remote pacemaker transmission.

## 2023-11-09 LAB — HEPATIC FUNCTION PANEL
ALT: 37 [IU]/L (ref 0–44)
AST: 25 [IU]/L (ref 0–40)
Albumin: 4.8 g/dL (ref 3.9–4.9)
Alkaline Phosphatase: 59 [IU]/L (ref 44–121)
Bilirubin Total: 0.5 mg/dL (ref 0.0–1.2)
Bilirubin, Direct: 0.2 mg/dL (ref 0.00–0.40)
Total Protein: 6.8 g/dL (ref 6.0–8.5)

## 2023-11-09 LAB — LIPID PANEL
Chol/HDL Ratio: 2.9 {ratio} (ref 0.0–5.0)
Cholesterol, Total: 94 mg/dL — ABNORMAL LOW (ref 100–199)
HDL: 32 mg/dL — ABNORMAL LOW (ref >39)
LDL Chol Calc (NIH): 42 mg/dL (ref 0–99)
Triglycerides: 108 mg/dL (ref 0–149)
VLDL Cholesterol Cal: 20 mg/dL (ref 5–40)

## 2023-11-09 LAB — LIPOPROTEIN A (LPA): Lipoprotein (a): 197.5 nmol/L — ABNORMAL HIGH (ref <75.0)

## 2023-11-10 ENCOUNTER — Other Ambulatory Visit (HOSPITAL_COMMUNITY): Payer: Self-pay

## 2023-11-10 ENCOUNTER — Encounter: Payer: Self-pay | Admitting: Internal Medicine

## 2023-11-23 ENCOUNTER — Encounter (HOSPITAL_COMMUNITY): Payer: Self-pay

## 2023-11-23 ENCOUNTER — Emergency Department (HOSPITAL_COMMUNITY)

## 2023-11-23 ENCOUNTER — Emergency Department (HOSPITAL_COMMUNITY)
Admission: EM | Admit: 2023-11-23 | Discharge: 2023-11-23 | Disposition: A | Discharge status: Home / Self Care | Attending: Emergency Medicine | Admitting: Emergency Medicine

## 2023-11-23 ENCOUNTER — Other Ambulatory Visit: Payer: Self-pay

## 2023-11-23 DIAGNOSIS — I2510 Atherosclerotic heart disease of native coronary artery without angina pectoris: Secondary | ICD-10-CM | POA: Diagnosis not present

## 2023-11-23 DIAGNOSIS — Z7901 Long term (current) use of anticoagulants: Secondary | ICD-10-CM | POA: Diagnosis not present

## 2023-11-23 DIAGNOSIS — R109 Unspecified abdominal pain: Secondary | ICD-10-CM | POA: Diagnosis present

## 2023-11-23 DIAGNOSIS — I110 Hypertensive heart disease with heart failure: Secondary | ICD-10-CM | POA: Diagnosis not present

## 2023-11-23 DIAGNOSIS — Z79899 Other long term (current) drug therapy: Secondary | ICD-10-CM | POA: Insufficient documentation

## 2023-11-23 DIAGNOSIS — Z950 Presence of cardiac pacemaker: Secondary | ICD-10-CM | POA: Diagnosis not present

## 2023-11-23 DIAGNOSIS — R61 Generalized hyperhidrosis: Secondary | ICD-10-CM | POA: Diagnosis not present

## 2023-11-23 DIAGNOSIS — J45909 Unspecified asthma, uncomplicated: Secondary | ICD-10-CM | POA: Insufficient documentation

## 2023-11-23 DIAGNOSIS — I509 Heart failure, unspecified: Secondary | ICD-10-CM | POA: Insufficient documentation

## 2023-11-23 LAB — COMPREHENSIVE METABOLIC PANEL
ALT: 32 U/L (ref 0–44)
AST: 29 U/L (ref 15–41)
Albumin: 4.5 g/dL (ref 3.5–5.0)
Alkaline Phosphatase: 45 U/L (ref 38–126)
Anion gap: 4 — ABNORMAL LOW (ref 5–15)
BUN: 17 mg/dL (ref 8–23)
CO2: 24 mmol/L (ref 22–32)
Calcium: 8.8 mg/dL — ABNORMAL LOW (ref 8.9–10.3)
Chloride: 108 mmol/L (ref 98–111)
Creatinine, Ser: 1.51 mg/dL — ABNORMAL HIGH (ref 0.61–1.24)
GFR, Estimated: 51 mL/min — ABNORMAL LOW (ref >60)
Glucose, Bld: 114 mg/dL — ABNORMAL HIGH (ref 70–99)
Potassium: 4.2 mmol/L (ref 3.5–5.1)
Sodium: 136 mmol/L (ref 135–145)
Total Bilirubin: 0.6 mg/dL (ref 0.0–1.2)
Total Protein: 7 g/dL (ref 6.5–8.1)

## 2023-11-23 LAB — CBC WITH DIFFERENTIAL/PLATELET
Abs Immature Granulocytes: 0.02 10*3/uL (ref 0.00–0.07)
Basophils Absolute: 0.1 10*3/uL (ref 0.0–0.1)
Basophils Relative: 2 %
Eosinophils Absolute: 0 10*3/uL (ref 0.0–0.5)
Eosinophils Relative: 1 %
HCT: 43.7 % (ref 39.0–52.0)
Hemoglobin: 14.9 g/dL (ref 13.0–17.0)
Immature Granulocytes: 0 %
Lymphocytes Relative: 23 %
Lymphs Abs: 1.5 10*3/uL (ref 0.7–4.0)
MCH: 31 pg (ref 26.0–34.0)
MCHC: 34.1 g/dL (ref 30.0–36.0)
MCV: 91 fL (ref 80.0–100.0)
Monocytes Absolute: 0.5 10*3/uL (ref 0.1–1.0)
Monocytes Relative: 7 %
Neutro Abs: 4.3 10*3/uL (ref 1.7–7.7)
Neutrophils Relative %: 67 %
Platelets: 199 10*3/uL (ref 150–400)
RBC: 4.8 MIL/uL (ref 4.22–5.81)
RDW: 14.3 % (ref 11.5–15.5)
WBC: 6.4 10*3/uL (ref 4.0–10.5)
nRBC: 0 % (ref 0.0–0.2)

## 2023-11-23 LAB — URINALYSIS, ROUTINE W REFLEX MICROSCOPIC
Bacteria, UA: NONE SEEN
Bilirubin Urine: NEGATIVE
Glucose, UA: >=500 mg/dL — AB
Hgb urine dipstick: NEGATIVE
Ketones, ur: NEGATIVE mg/dL
Leukocytes,Ua: NEGATIVE
Nitrite: NEGATIVE
Protein, ur: NEGATIVE mg/dL
Specific Gravity, Urine: 1.012 (ref 1.005–1.030)
pH: 6 (ref 5.0–8.0)

## 2023-11-23 LAB — LIPASE, BLOOD: Lipase: 42 U/L (ref 11–51)

## 2023-11-23 MED ORDER — CYCLOBENZAPRINE HCL 10 MG PO TABS: 10.0000 mg | ORAL_TABLET | Freq: Two times a day (BID) | ORAL | 0 refills | Status: DC | PRN

## 2023-11-23 NOTE — ED Triage Notes (Signed)
 Pt reports left side flank pain x 3 months and denies any urinary discomfort.  Pt reports today the pain is worse then it has been.

## 2023-11-23 NOTE — ED Provider Notes (Signed)
 [place] EMERGENCY DEPARTMENT AT [facility] Provider Note   CSN: [id number] Arrival date & time: 11/23/23  1524     History  Chief Complaint  Patient presents with   Flank Pain    [name] [name] is a 68 y.o. male.  67-year  The history is provided by the patient, the spouse and medical records. No language interpreter was used.  Flank Pain     This is a 67 year old male history of asthma, CAD, hypertension, bipolar, heart block with cardiac pacemaker, CHF, valvular presenting with complaint of flank pain.  Patient mention he has been dealing with persistent pain to his left flank ongoing for at least 3 months.  Pain is always present sometimes became much worse.  At this time he rates pain as 6 out of 10.  He normally use a heating pad to help with his pain.  He also has history of cardiac disease and does take nitro on a daily basis.  He mention sometimes nitro seems to alleviate his pain but other times it does not.  He does history of cardiac disease and states he had a cardiac stress test done several months ago and was told that they did not find any thing concerning.  He he reported previously he was told that he has a spot in his kidney that his doctor is monitoring closely.  He voiced concerns that his symptoms related to his kidney.  Does have history of kidney stone but denies any burning urination or seeing any hematuria.  No fever no chills no lightheadedness or dizziness no exertional chest pain.  Denies any recent strength activities or injury.  Pain sometimes worsened with movement.  He endorsed occasional night sweat but denies any weight changes or fever.  No history of active cancer.  Home Medications Prior to Admission medications   Medication Sig Start Date End Date Taking? Authorizing Provider  allopurinol (ZYLOPRIM) 300 MG tablet Take 300 mg by mouth every evening.    [provider]  colchicine 0.6 MG tablet Take 0.6 mg by mouth daily as  needed (Gout). 06/12/15   [provider]  empagliflozin (JARDIANCE) 10 MG TABS tablet Take 1 tablet (10 mg total) by mouth daily before breakfast. 10/27/23   Orbie Pyo, MD  empagliflozin (JARDIANCE) 10 MG TABS tablet Take 1 tablet (10 mg total) by mouth daily. 10/27/23   Orbie Pyo, MD  fluticasone (FLONASE) 50 MCG/ACT nasal spray Place 2 sprays into both nostrils daily.    [provider]  isosorbide mononitrate (IMDUR) 30 MG 24 hr tablet Take 0.5 tablets (15 mg total) by mouth daily. 06/20/23 09/18/23  Sharlene Dory, PA-C  loratadine (CLARITIN) 10 MG tablet Take 10 mg by mouth daily.    [provider]  Magnesium 300 MG CAPS Take 300 mg by mouth daily.    [provider]  Melatonin 10 MG SUBL 1 tablet under the tongue and allow to dissolve at bedtime as needed Sublingual at bedtime    [provider]  metoprolol succinate (TOPROL-XL) 25 MG 24 hr tablet TAKE 1 TABLET BY MOUTH EVERY DAY. MAY TAKE 1/2 TO 1 TABLET EXTRA FOR PALITATTIONS 12/28/19   Marinus Maw, MD  nitroGLYCERIN (NITROLINGUAL) 0.4 MG/SPRAY spray USE 1 SPRAY UNDER THE TONGUE EVERY 5 MINUTES AS NEEDED FOR CHEST PAIN 06/20/23   Sharlene Dory, PA-C  Omega-3 Fatty Acids (FISH OIL PO) Take 900 mg by mouth daily.    [provider]  omeprazole (PRILOSEC) 40 MG capsule Take 1 capsule (40 mg total) by mouth daily. 05/26/23   Letta Median, PA-C  ondansetron (ZOFRAN) 4 MG tablet Take 1 tablet (4 mg total) by mouth every 8 (eight) hours as needed for nausea or vomiting. 05/26/23   Letta Median, PA-C  POTASSIUM PO Take 1 tablet by mouth daily.    [provider]  QUEtiapine (SEROQUEL) 300 MG tablet Take 300 mg by mouth at bedtime.    [provider]  rivaroxaban (XARELTO) 20 MG TABS tablet Take 1 tablet (20 mg total) by mouth daily with supper. 01/15/23   Corky Crafts, MD  rosuvastatin (CRESTOR) 10 MG tablet TAKE 1 TABLET BY MOUTH EVERY DAY 12/04/20    Marinus Maw, MD  silodosin (RAPAFLO) 8 MG CAPS capsule TAKE 1 CAPSULE BY MOUTH EVERYDAY AT BEDTIME 10/01/23   McKenzie, Mardene Celeste, MD  temazepam (RESTORIL) 30 MG capsule Take 30 mg by mouth at bedtime.  02/20/16   [provider]  valsartan-hydrochlorothiazide (DIOVAN HCT) 320-12.5 MG tablet Take 1 tablet by mouth daily. 06/20/23   Sharlene Dory, PA-C      Allergies    Sulfonamide derivatives, Zolpidem, and Zolpidem tartrate    Review of Systems   Review of Systems  Genitourinary:  Positive for flank pain.  All other systems reviewed and are negative.   Physical Exam Updated Vital Signs BP 129/74 (BP Location: Right Arm)   Pulse 68   Temp 98.2 F (36.8 C)   Resp 16   Ht 5\' 7"  (1.702 m)   Wt 104.3 kg   SpO2 96%   BMI 36.02 kg/m  Physical Exam Vitals and nursing note reviewed.  Constitutional:      General: He is not in acute distress.    Appearance: He is well-developed.  HENT:     Head: Atraumatic.  Eyes:     Conjunctiva/sclera: Conjunctivae normal.  Cardiovascular:     Rate and Rhythm: Normal rate and regular rhythm.     Pulses: Normal pulses.     Heart sounds: Normal heart sounds.  Pulmonary:     Effort: Pulmonary effort is normal.     Breath sounds: Normal breath sounds.  Abdominal:     Palpations: Abdomen is soft.     Tenderness: There is no abdominal tenderness. There is no right CVA tenderness or left CVA tenderness.  Musculoskeletal:        General: Normal range of motion.     Cervical back: Neck supple.  Skin:    Capillary Refill: Capillary refill takes less than 2 seconds.     Findings: No rash.  Neurological:     Mental Status: He is alert.     ED Results / Procedures / Treatments   Labs (all labs ordered are listed, but only abnormal results are displayed) Labs Reviewed  URINALYSIS, ROUTINE W REFLEX MICROSCOPIC - Abnormal; Notable for the following components:      Result Value   Glucose, UA >=500 (*)    All other components within  normal limits  COMPREHENSIVE METABOLIC PANEL - Abnormal; Notable for the following components:   Glucose, Bld 114 (*)    Creatinine, Ser 1.51 (*)    Calcium 8.8 (*)    GFR, Estimated 51 (*)    Anion gap 4 (*)    All other components within normal limits  CBC WITH DIFFERENTIAL/PLATELET  LIPASE, BLOOD    EKG None ED ECG REPORT   Date: 11/23/2023  Rate:  64  Rhythm: normal sinus rhythm  QRS Axis: left  Intervals: QT prolonged  ST/T Wave abnormalities: nonspecific ST changes  Conduction Disutrbances:none  Narrative Interpretation: paced rhytym  Old EKG Reviewed: unchanged  I have personally reviewed the EKG tracing and agree with the computerized printout as noted.   Radiology CT Renal Stone Study Result Date: 11/23/2023 CLINICAL DATA:  Abdominal/flank pain, stone suspected EXAM: CT ABDOMEN AND PELVIS WITHOUT CONTRAST TECHNIQUE: Multidetector CT imaging of the abdomen and pelvis was performed following the standard protocol without IV contrast. RADIATION DOSE REDUCTION: This exam was performed according to the departmental dose-optimization program which includes automated exposure control, adjustment of the mA and/or kV according to patient size and/or use of iterative reconstruction technique. COMPARISON:  05/29/2023 FINDINGS: Lower chest: Small pulmonary nodules in the lung bases. The largest is in the right lower lobe measuring 5 mm, series 4, image 5. Nodules are not significantly changed from prior exam allowing for differences in caliper placement. No specific nodule follow-up is needed. Hepatobiliary: Mild diffuse hepatic steatosis. No evidence of focal liver lesion. Question of subtle capsular nodularity of the liver. Gallstones without gallbladder inflammation. No biliary dilatation. Pancreas: No ductal dilatation or inflammation. Spleen: Chronic splenomegaly, the spleen spans 18.4 cm AP, unchanged. Splenule at the hilum. Adrenals/Urinary Tract: No adrenal nodule. No hydronephrosis  or renal calculi. No evidence of focal renal lesion. Nondistended urinary bladder. Stomach/Bowel: Unremarkable appearance of the stomach. There is no bowel obstruction or inflammation. Diminutive appendix tentatively visualized. Small to moderate colonic stool burden. Occasional colonic diverticula without diverticulitis. Umbilical hernia contains a short segment of small bowel. Vascular/Lymphatic: Aortic atherosclerosis. No aortic aneurysm. 10 mm periportal node series 2, image 12, unchanged. Scattered small retroperitoneal lymph nodes not enlarged by size criteria. Reproductive: Enlarged prostate spans 6.3 cm transverse. Other: Right retroperitoneal/perarenal soft tissue nodules measuring 13 mm short axis series 2, image 51 and 8 mm short axis series 2, image 52. These are not significantly changed from prior exam. These were not hypermetabolic on intervening PET. No ascites. Small umbilical hernia contains short segment of small bowel. Minimal fat in the inguinal canals. Musculoskeletal: Postsurgical change at L5-S1. There are no acute or suspicious osseous abnormalities. IMPRESSION: 1. No acute abnormality in the abdomen/pelvis. No nephrolithiasis or hydronephrosis. 2. Chronic splenomegaly. 3. Hepatic steatosis. Question of subtle capsular nodularity of the liver, can be seen with cirrhosis. 4. Cholelithiasis without gallbladder inflammation. 5. Small umbilical hernia contains a short segment of small bowel. No bowel obstruction or inflammation. 6. Enlarged prostate. 7. Right retroperitoneal/perarenal soft tissue nodules are unchanged from prior, without hypermetabolism on intervening PET. Aortic Atherosclerosis (ICD10-I70.0). Electronically Signed   By: Narda Rutherford M.D.   On: 11/23/2023 16:22    Procedures Procedures    Medications Ordered in ED Medications - No data to display  ED Course/ Medical Decision Making/ A&P                                 Medical Decision Making Amount and/or  Complexity of Data Reviewed Labs: ordered. Radiology: ordered.   BP 129/74 (BP Location: Right Arm)   Pulse 68   Temp 98.2 F (36.8 C)   Resp 16   Ht 5\' 7"  (1.702 m)   Wt 104.3 kg   SpO2 96%   BMI 36.02 kg/m   3:49 PM  This is a 68 year old male history of asthma, CAD, hypertension, bipolar, heart block with cardiac  pacemaker, CHF, gout presenting with complaint of flank pain.  Patient mention he has been dealing with persistent pain to his left flank ongoing for at least 3 months.  Pain is always present sometimes became much worse.  At this time he rates pain as 6 out of 10.  He normally use a heating pad to help with his pain.  He also has history of cardiac disease and does take nitro on a daily basis.  He mention sometimes nitro seems to alleviate his pain but other times it does not.  He does history of cardiac disease and states he had a cardiac stress test done several months ago and was told that they did not find any thing concerning.  He he reported previously he was told that he has a spot in his kidney that his doctor is monitoring closely.  He voiced concerns that his symptoms related to his kidney.  Does have history of kidney stone but denies any burning urination or seeing any hematuria.  No fever no chills no lightheadedness or dizziness no exertional chest pain.  Denies any recent strength activities or injury.  Pain sometimes worsened with movement.  He endorsed occasional night sweat but denies any weight changes or fever.  No history of active cancer.  Exam overall reassuring, patient is resting comfortably in bed appears to be in no acute discomfort.  Heart with normal rate and rhythm, lungs clear to auscultation bilaterally abdomen is soft nontender no CVA tenderness noted no concerning rash noted on skin exam.  Vital sign overall reassuring normal blood pressure, no fever no hypoxia.  EMR review, patient had abdominal pelvis CT scan with contrast that was performed on  September 2024 which demonstrate 2 areas of soft tissue nodularity within the right lower abdominal fat inferior to the right perinephric space that was not present from a prior CT scan in 2021.  A PET scan was recommended for further evaluation.  However, his discomfort today is left flank and due to history of kidney stone, will obtain renal stone study CT scan for further assessment.  -Labs ordered, independently viewed and interpreted by me.  Labs remarkable for Cr 1.51, similar to prior. UA without blood or UTI -The patient was maintained on a cardiac monitor.  I personally viewed and interpreted the cardiac monitored which showed an underlying rhythm of: NSR -Imaging independently viewed and interpreted by me and I agree with radiologist's interpretation.  Result remarkable for abd/pelvis CT without acute changes. Chronic splenomegaly.  Cholelithiasis without evidence of cholecystitis.  Enlarge prostate. -This patient presents to the ED for concern of left flank pain, this involves an extensive number of treatment options, and is a complaint that carries with it a high risk of complications and morbidity.  The differential diagnosis includes MSK, kidney stone, dissection, splenomegaly, shingles, malignancy -Co morbidities that complicate the patient evaluation includes CHF, CAD, bipolar, fatty liver -Treatment includes monitoring -Reevaluation of the patient after these medicines showed that the patient stayed the same -PCP office notes or outside notes reviewed -Discussion with attending Dr. Hyacinth Meeker -Escalation to admission/observation considered: patients feels much better, is comfortable with discharge, and will follow up with PCP -Prescription medication considered, patient comfortable with flexeril -Social Determinant of Health considered which includes tobacco use         Final Clinical Impression(s) / ED Diagnoses Final diagnoses:  Left flank pain    Rx / DC Orders ED  Discharge Orders  Ordered    cyclobenzaprine (FLEXERIL) 10 MG tablet  2 times daily PRN        11/23/23 1807              Fayrene Helper, PA-C 11/23/23 1809    Eber Hong, MD 11/25/23 [phone]

## 2023-11-23 NOTE — Discharge Instructions (Signed)
 You have been evaluated for your flank pain.  Fortunately no concerning findings were noted on today's exam.  You may take muscle relaxant as needed for pain and follow-up closely with your doctor for further care.

## 2023-12-22 ENCOUNTER — Ambulatory Visit (INDEPENDENT_AMBULATORY_CARE_PROVIDER_SITE_OTHER): Payer: BC Managed Care – PPO

## 2023-12-22 DIAGNOSIS — I480 Paroxysmal atrial fibrillation: Secondary | ICD-10-CM

## 2023-12-22 DIAGNOSIS — I442 Atrioventricular block, complete: Secondary | ICD-10-CM

## 2023-12-23 LAB — CUP PACEART REMOTE DEVICE CHECK
Battery Remaining Longevity: 17 mo
Battery Voltage: 2.9 V
Brady Statistic AP VP Percent: 17.27 %
Brady Statistic AP VS Percent: 0 %
Brady Statistic AS VP Percent: 82.67 %
Brady Statistic AS VS Percent: 0.06 %
Brady Statistic RA Percent Paced: 17.15 %
Brady Statistic RV Percent Paced: 99.93 %
Date Time Interrogation Session: 20250408115051
Implantable Lead Connection Status: 753985
Implantable Lead Connection Status: 753985
Implantable Lead Implant Date: 20161228
Implantable Lead Implant Date: 20161228
Implantable Lead Location: 753859
Implantable Lead Location: 753860
Implantable Lead Model: 5076
Implantable Lead Model: 5076
Implantable Pulse Generator Implant Date: 20161228
Lead Channel Impedance Value: 342 Ohm
Lead Channel Impedance Value: 361 Ohm
Lead Channel Impedance Value: 380 Ohm
Lead Channel Impedance Value: 437 Ohm
Lead Channel Pacing Threshold Amplitude: 0.875 V
Lead Channel Pacing Threshold Amplitude: 1.125 V
Lead Channel Pacing Threshold Pulse Width: 0.4 ms
Lead Channel Pacing Threshold Pulse Width: 0.4 ms
Lead Channel Sensing Intrinsic Amplitude: 0.875 mV
Lead Channel Sensing Intrinsic Amplitude: 0.875 mV
Lead Channel Sensing Intrinsic Amplitude: 12.375 mV
Lead Channel Sensing Intrinsic Amplitude: 12.375 mV
Lead Channel Setting Pacing Amplitude: 2.5 V
Lead Channel Setting Pacing Amplitude: 2.5 V
Lead Channel Setting Pacing Pulse Width: 0.4 ms
Lead Channel Setting Sensing Sensitivity: 2.8 mV
Zone Setting Status: 755011
Zone Setting Status: 755011

## 2023-12-24 ENCOUNTER — Encounter: Payer: Self-pay | Admitting: Internal Medicine

## 2024-01-20 ENCOUNTER — Encounter: Payer: Self-pay | Admitting: Gastroenterology

## 2024-01-20 ENCOUNTER — Inpatient Hospital Stay: Payer: Medicare HMO | Attending: Hematology

## 2024-01-20 ENCOUNTER — Ambulatory Visit (HOSPITAL_COMMUNITY)
Admission: RE | Admit: 2024-01-20 | Discharge: 2024-01-20 | Disposition: A | Discharge status: Home / Self Care | Payer: Medicare HMO | Source: Ambulatory Visit | Attending: Hematology | Admitting: Hematology

## 2024-01-20 ENCOUNTER — Encounter (HOSPITAL_COMMUNITY): Payer: Self-pay | Admitting: Radiology

## 2024-01-20 ENCOUNTER — Other Ambulatory Visit: Payer: Medicare HMO

## 2024-01-20 DIAGNOSIS — R1031 Right lower quadrant pain: Secondary | ICD-10-CM | POA: Diagnosis present

## 2024-01-20 DIAGNOSIS — M7989 Other specified soft tissue disorders: Secondary | ICD-10-CM | POA: Diagnosis present

## 2024-01-20 LAB — COMPREHENSIVE METABOLIC PANEL WITH GFR
ALT: 32 U/L (ref 0–44)
AST: 24 U/L (ref 15–41)
Albumin: 4.4 g/dL (ref 3.5–5.0)
Alkaline Phosphatase: 48 U/L (ref 38–126)
Anion gap: 9 (ref 5–15)
BUN: 15 mg/dL (ref 8–23)
CO2: 27 mmol/L (ref 22–32)
Calcium: 9.4 mg/dL (ref 8.9–10.3)
Chloride: 101 mmol/L (ref 98–111)
Creatinine, Ser: 1.27 mg/dL — ABNORMAL HIGH (ref 0.61–1.24)
GFR, Estimated: >60 mL/min (ref >60)
Glucose, Bld: 126 mg/dL — ABNORMAL HIGH (ref 70–99)
Potassium: 4.3 mmol/L (ref 3.5–5.1)
Sodium: 137 mmol/L (ref 135–145)
Total Bilirubin: 0.8 mg/dL (ref 0.0–1.2)
Total Protein: 7 g/dL (ref 6.5–8.1)

## 2024-01-20 LAB — CBC WITH DIFFERENTIAL/PLATELET
Abs Immature Granulocytes: 0.04 10*3/uL (ref 0.00–0.07)
Basophils Absolute: 0.1 10*3/uL (ref 0.0–0.1)
Basophils Relative: 2 %
Eosinophils Absolute: 0.1 10*3/uL (ref 0.0–0.5)
Eosinophils Relative: 1 %
HCT: 45.7 % (ref 39.0–52.0)
Hemoglobin: 15.7 g/dL (ref 13.0–17.0)
Immature Granulocytes: 1 %
Lymphocytes Relative: 19 %
Lymphs Abs: 1.3 10*3/uL (ref 0.7–4.0)
MCH: 31.5 pg (ref 26.0–34.0)
MCHC: 34.4 g/dL (ref 30.0–36.0)
MCV: 91.8 fL (ref 80.0–100.0)
Monocytes Absolute: 0.5 10*3/uL (ref 0.1–1.0)
Monocytes Relative: 7 %
Neutro Abs: 5 10*3/uL (ref 1.7–7.7)
Neutrophils Relative %: 70 %
Platelets: 201 10*3/uL (ref 150–400)
RBC: 4.98 MIL/uL (ref 4.22–5.81)
RDW: 14.2 % (ref 11.5–15.5)
WBC: 7.1 10*3/uL (ref 4.0–10.5)
nRBC: 0 % (ref 0.0–0.2)

## 2024-01-20 LAB — LACTATE DEHYDROGENASE: LDH: 121 U/L (ref 98–192)

## 2024-01-20 MED ORDER — IOHEXOL 300 MG/ML  SOLN
100.0000 mL | Freq: Once | INTRAMUSCULAR | Status: AC | PRN
  Administered 2024-01-20: 100 mL via INTRAVENOUS

## 2024-01-26 NOTE — Progress Notes (Signed)
 [facility] [address], [place] [address]   Clinic Day:  01/26/2024  Referring physician: Sinda Duel, PA  Patient Care Team: Sinda Duel, PA as PCP - General (Family Medicine) Tammie Fall, MD as PCP - Cardiology (Cardiology) Riley Cheadle Windsor Hatcher, MD as Consulting Physician (Gastroenterology) Paulett Boros, MD as Medical Oncologist (Medical Oncology) Gerhard Knuckles, RN as Oncology Nurse Navigator (Medical Oncology)   ASSESSMENT & PLAN:   Assessment:  1.  Right perinephric soft tissue nodularity: - CTAP done for right-sided abdominal pain (05/29/2023): 2 areas of soft tissue nodularity within the right lower abdomen, measuring 2.3 x 1.8 x 1.9 cm send 1.5 x 1 x 1.3 cm.  These appear to most likely be within the anterior retroperitoneal fat likely just within the inferior aspect of the right perinephric space.  They were not present in 2021.  Stable splenomegaly with estimated volume of 1200 mL.  Stable mild steatosis of the liver without evidence of overt cirrhosis. - PET scan (06/26/2023): No hypermetabolic activity within the liver, pancreas, adrenal glands or spleen.  No hypermetabolic lymph nodes in the abdomen or pelvis.  1.9 cm soft tissue nodule in the lateral right perinephric space without appreciable hypermetabolic with SUV 1.8 beneath the blood pool.  It is not 14 mm perinephric soft tissue nodule non-FDG-avid.  Splenomegaly measuring 17.7 cm without focal hypermetabolism. - No prior history of abdominal surgery or pyelonephritis.  No B symptoms.  2.  Social/family history: - Lives at home with his wife.  He retired after working at [employer].  No chemical exposure.  Quit smoking cigarettes 38 years ago.  Smoked 1 and half pack per day for 17 years. - Mother had lung cancer.  Father had liver cancer.  Maternal uncle had testicular cancer.  Maternal aunt died of metastatic cancer.  Plan:  1.  Right perinephric soft tissue nodularity: - We  have reviewed images of both CT scan and PET scan with the patient and his wife in detail. - He does not have any significant symptoms from it.  He has vague right sided abdominal pain and diarrhea on and off.  I do not believe these nodules are contributing to his symptoms. - I have recommended follow-up CT of the abdomen and pelvis with contrast in 6 months to make sure these nodules are stable.  Will also check labs at that time.  2.  Splenomegaly: - Etiology unclear at this time.  Does not have any significant cirrhosis but has fatty liver.  He does not have any cytopenias based on the most recent CBC from September 9.  Will continue to monitor.   No orders of the defined types were placed in this encounter.     [name] [name],acting as a Neurosurgeon for Paulett Boros, MD.,have documented all relevant documentation on the behalf of Paulett Boros, MD,as directed by  Paulett Boros, MD while in the presence of Paulett Boros, MD.  ***   [name] [name]   5/12/20254:46 PM  CHIEF COMPLAINT/PURPOSE OF CONSULT:   Diagnosis: soft tissue nodules in the right perinephric space   Cancer Staging  No matching staging information was found for the patient.    Prior Therapy: none  Current Therapy: Observation   HISTORY OF PRESENT ILLNESS:   Oncology History   No history exists.      [name] [name] is a 68 y.o. male presenting to clinic today for evaluation of soft tissue disorder at the request of Starling Eck  April Knack, PA-C.  He was initially referred to GI back in 10/2022 with concerns for nausea and dry heaving with intermittent RUQ abdominal pain. He was scheduled for EGD and colonoscopy in 01/2023, but he was also scheduled for left heart cath and angiography around the same time. He was seen back for follow up in 05/2023 and underwent CT A/P on 05/29/23 showing: two areas of soft tissue nodularity within RLQ abdominal fat, measuring 2.3 cm and 1.5 cm, appearing to most  likely be within anterior retroperitoneal fat likely just within inferior aspect of right perinephric space; stable splenomegaly, mild hepatic steatosis, and cholelithiasis.  These findings prompted a staging PET scan on 06/26/23 showing: no findings suspicious for lymphoma; small right perinephric nodules without appreciable hypermetabolism; splenomegaly without focal hypermetabolism.  Today, he states that he is doing well overall. His appetite level is at 100%. His energy level is at 30%.  INTERVAL HISTORY:   [name] [name] is a 68 y.o. male presenting to the clinic today for follow-up of right perinephric soft tissue nodularity. He was last seen by me on 07/28/23 in consultation.  Since his last visit, he had CT AP on 01/20/24 that found: No acute process is identified within the abdomen or pelvis. Stable appearance of the nodular densities within the fat of the anterior right retroperitoneum separate from and inferior to the right lower pole.  Of note, [name] underwent colonoscopy on 07/28/23 with Dr. Riley Cheadle.   Today, he states that he is doing well overall. His appetite level is at ***%. His energy level is at ***%.   PAST MEDICAL HISTORY:   Past Medical History: Past Medical History:  Diagnosis Date   ALLERGIC RHINITIS 05/14/2010   Qualifier: Diagnosis of  By: Doralee Gallop PA, Dawn     Asthma    Asthma    Atrial fibrillation [facility])    new November 2016   Bipolar 1 disorder [facility])    "put me on this after I got out of drug rehab 11/18/2005 and couldn't sleep; added anxiety RX and have been sleeping fine since"   Bradycardia    Very limited Wenkebach on event recorder November 2010   CAD (coronary artery disease) 2006   3v CAD >> s/p CABG in [place], patent LIMA to LAD and SVG to diagonal with 40% stenosis 2010 // Nuc 2/14: low risk, EF 56 // LHC 3/16: patent L-LAD, patent S-Dx // Echo 3/16: mild conc LVH, EF 60-65, Gr 1 DD, trivial TR, mild LAE // LHC 12/19: oLAD 100, pLCx 20, S-D1  patent, L-LAD patent, EF 45   Cardiac pacemaker in situ 08/18/2018   CHB (complete heart block) (HCC) 12/02/2014   Chest pain with moderate risk of acute coronary syndrome    Chronic systolic CHF (congestive heart failure) (HCC) 09/15/2018   EF 45 by LHC in 12/19 // Echo 09/2018: inf HK, EF 45-50, mod LAE   Dyslipidemia    mixed    ED (erectile dysfunction)    Ejection fraction    EF 50%, catheterization, September, 2010   //   EF 60-65%, echo, December 03, 2014    Essential hypertension    GERD (gastroesophageal reflux disease)    Gout    History of stomach ulcers 1980's   Hx of CABG    2006, [place]    Memory loss-SVD on MRI March 2015 11/18/2013   Mixed hyperlipidemia    pt states "they took me off q med they had me on for high cholesterol; my good  cholesterol is low" (09/13/2015)   Myocardial infarction (HCC) 2006   OSA (obstructive sleep apnea)    patient denies, stated had a sleep study and he was told he did not have     PAF (paroxysmal atrial fibrillation) (HCC) 08/18/2018   Palpitations    Event recorder November, 2010, no significant arrhythmias    Pneumonia 2015   Pneumonia 1990's X 4 in one year   "double pneumonia"   Presence of permanent cardiac pacemaker    Second degree AV block 09/13/2015   Second degree AV block, Mobitz type I 03/10/2013   Patient had Mobitz 1 heart block that was asymptomatic. This resolved off beta blockade while in the hospital March, 2016. Plan to keep the patient off beta blockers. Patient will be seen in follow-up by Dr. Carolynne Citron and the [place] office.    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[facility])     Surgical History: Past Surgical History:  Procedure Laterality Date   BACK SURGERY     BIOPSY  07/28/2023   Procedure: BIOPSY;  Surgeon: Suzette Espy, MD;  [facility];  Service: Endoscopy;;   CARDIAC CATHETERIZATION     "I've had quite a few; don't remember any stents" (09/13/2015)   COLONOSCOPY WITH PROPOFOL  N/A 07/28/2023    Procedure: COLONOSCOPY WITH PROPOFOL ;  Surgeon: Suzette Espy, MD;  Location: [facility];  Service: Endoscopy;  Laterality: N/A;  1:15 pm, asa 3   CORONARY ANGIOPLASTY     CORONARY ARTERY BYPASS GRAFT  2006   "CABG X2, in [place]"   EP IMPLANTABLE DEVICE N/A 09/13/2015   Procedure: Pacemaker Implant;  Surgeon: Jolly Needle, MD;  Location: [facility] INVASIVE CV LAB;  Service: Cardiovascular;  Laterality: N/A;   ESOPHAGOGASTRODUODENOSCOPY (EGD) WITH PROPOFOL  N/A 07/28/2023   Procedure: ESOPHAGOGASTRODUODENOSCOPY (EGD) WITH PROPOFOL ;  Surgeon: Suzette Espy, MD;  [facility];  Service: Endoscopy;  Laterality: N/A;   INSERT / REPLACE / REMOVE PACEMAKER  09/13/2015   [employer]   LEFT HEART CATH AND CORS/GRAFTS ANGIOGRAPHY N/A 08/26/2018   Procedure: LEFT HEART CATH AND CORS/GRAFTS ANGIOGRAPHY;  Surgeon: Millicent Ally, MD;  Location: [facility] INVASIVE CV LAB;  Service: Cardiovascular;  Laterality: N/A;   LEFT HEART CATH AND CORS/GRAFTS ANGIOGRAPHY N/A 01/14/2023   Procedure: LEFT HEART CATH AND CORS/GRAFTS ANGIOGRAPHY;  Surgeon: Lucendia Rusk, MD;  Location: [facility] INVASIVE CV LAB;  Service: Cardiovascular;  Laterality: N/A;   LEFT HEART CATHETERIZATION WITH CORONARY/GRAFT ANGIOGRAM N/A 12/05/2014   Procedure: LEFT HEART CATHETERIZATION WITH Estella Helling;  Surgeon: Peter M Swaziland, MD;  Location: [facility] CATH LAB;  Service: Cardiovascular;  Laterality: N/A;   LEG SURGERY Left 1961   "had 5 boils come up on my leg; they had to cut them out and put a drainage tube in there"   LUMBAR DISC SURGERY  X 3   "ruptured disc"   MAXIMUM ACCESS (MAS)POSTERIOR LUMBAR INTERBODY FUSION (PLIF) 1 LEVEL  ~ 2003   "L4-5; put rods & screws in"    Social History: Social History   Socioeconomic History   Marital status: Married    Spouse name: Not on file   Number of children: Not on file   Years of education: Not on file   Highest education level: Not on file  Occupational History   Not on  file  Tobacco Use   Smoking status: Former    Current packs/day: 0.00    Average packs/day: 2.0 packs/day for 17.0 years (34.0 ttl pk-yrs)    Types:  Cigarettes    Start date: 03/13/2000    Quit date: 03/13/2017    Years since quitting: 6.8   Smokeless tobacco: Current    Types: Snuff   Tobacco comments:    09/13/2015 "quit chewing tobacco August 2016"  Vaping Use   Vaping status: Never Used  Substance and Sexual Activity   Alcohol use: Not Currently    Comment: 09/13/2015 "got out of alcohol rehab 11/18/2005"   Drug use: Not Currently    Comment: 09/13/2015 "got out of rehab for pills 11/18/2005"   Sexual activity: Yes    Birth control/protection: None  Other Topics Concern   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    Social Drivers of Corporate investment banker Strain: Not on file  Food Insecurity: No Food Insecurity (07/28/2023)   Hunger Vital Sign    Worried About Running Out of Food in the Last Year: Never true    Ran Out of Food in the Last Year: Never true  Transportation Needs: No Transportation Needs (07/28/2023)   PRAPARE - Administrator, Civil Service (Medical): No    Lack of Transportation (Non-Medical): No  Physical Activity: Not on file  Stress: Not on file  Social Connections: Not on file  Intimate Partner Violence: Not At Risk (07/28/2023)   Humiliation, Afraid, Rape, and Kick questionnaire    Fear of Current or Ex-Partner: No    Emotionally Abused: No    Physically Abused: No    Sexually Abused: No    Family History: Family History  Problem Relation Age of Onset   COPD Mother    Lung cancer Mother    Stroke Mother    Heart attack Mother    Heart disease Father    Heart attack Father    Hyperlipidemia Brother    Hypertension Brother    Hypertension Brother    Hypertension Sister     Current Medications:  Current Outpatient Medications:    allopurinol  (ZYLOPRIM ) 300 MG  tablet, Take 300 mg by mouth every evening., Disp: , Rfl:    colchicine  0.6 MG tablet, Take 0.6 mg by mouth daily as needed (Gout)., Disp: , Rfl: 0   cyclobenzaprine  (FLEXERIL ) 10 MG tablet, Take 1 tablet (10 mg total) by mouth 2 (two) times daily as needed for muscle spasms., Disp: 20 tablet, Rfl: 0   empagliflozin  (JARDIANCE ) 10 MG TABS tablet, Take 1 tablet (10 mg total) by mouth daily before breakfast., Disp: 30 tablet, Rfl: 11   empagliflozin  (JARDIANCE ) 10 MG TABS tablet, Take 1 tablet (10 mg total) by mouth daily., Disp: 28 tablet, Rfl:    fluticasone (FLONASE) 50 MCG/ACT nasal spray, Place 2 sprays into both nostrils daily., Disp: , Rfl:    isosorbide  mononitrate (IMDUR ) 30 MG 24 hr tablet, Take 0.5 tablets (15 mg total) by mouth daily., Disp: 45 tablet, Rfl: 3   loratadine  (CLARITIN ) 10 MG tablet, Take 10 mg by mouth daily., Disp: , Rfl:    Magnesium 300 MG CAPS, Take 300 mg by mouth daily., Disp: , Rfl:    Melatonin 10 MG SUBL, 1 tablet under the tongue and allow to dissolve at bedtime as needed Sublingual at bedtime, Disp: , Rfl:    metoprolol  succinate (TOPROL -XL) 25 MG 24 hr tablet, TAKE 1 TABLET BY MOUTH EVERY DAY. MAY TAKE 1/2 TO 1 TABLET EXTRA FOR PALITATTIONS, Disp: 60 tablet, Rfl: 11   nitroGLYCERIN  (NITROLINGUAL ) 0.4 MG/SPRAY spray, USE 1  SPRAY UNDER THE TONGUE EVERY 5 MINUTES AS NEEDED FOR CHEST PAIN, Disp: 12 g, Rfl: 3   Omega-3 Fatty Acids (FISH OIL PO), Take 900 mg by mouth daily., Disp: , Rfl:    omeprazole  (PRILOSEC) 40 MG capsule, Take 1 capsule (40 mg total) by mouth daily., Disp: 90 capsule, Rfl: 3   ondansetron  (ZOFRAN ) 4 MG tablet, Take 1 tablet (4 mg total) by mouth every 8 (eight) hours as needed for nausea or vomiting., Disp: 30 tablet, Rfl: 1   POTASSIUM PO, Take 1 tablet by mouth daily., Disp: , Rfl:    QUEtiapine  (SEROQUEL ) 300 MG tablet, Take 300 mg by mouth at bedtime., Disp: , Rfl:    rivaroxaban  (XARELTO ) 20 MG TABS tablet, Take 1 tablet (20 mg total) by mouth  daily with supper., Disp: 30 tablet, Rfl:    rosuvastatin  (CRESTOR ) 10 MG tablet, TAKE 1 TABLET BY MOUTH EVERY DAY, Disp: 30 tablet, Rfl: 0   silodosin  (RAPAFLO ) 8 MG CAPS capsule, TAKE 1 CAPSULE BY MOUTH EVERYDAY AT BEDTIME, Disp: 90 capsule, Rfl: 3   temazepam (RESTORIL) 30 MG capsule, Take 30 mg by mouth at bedtime. , Disp: , Rfl: 5   valsartan -hydrochlorothiazide  (DIOVAN  HCT) 320-12.5 MG tablet, Take 1 tablet by mouth daily., Disp: 90 tablet, Rfl: 3   Allergies: Allergies  Allergen Reactions   Sulfonamide Derivatives Hives   Zolpidem  Hives   Zolpidem  Tartrate Other (See Comments)    REVIEW OF SYSTEMS:   Review of Systems  Constitutional:  Negative for chills, fatigue and fever.  HENT:   Negative for lump/mass, mouth sores, nosebleeds, sore throat and trouble swallowing.   Eyes:  Negative for eye problems.  Respiratory:  Negative for cough and shortness of breath.   Cardiovascular:  Negative for chest pain, leg swelling and palpitations.  Gastrointestinal:  Negative for abdominal pain, constipation, diarrhea, nausea and vomiting.  Genitourinary:  Negative for bladder incontinence, difficulty urinating, dysuria, frequency, hematuria and nocturia.   Musculoskeletal:  Negative for arthralgias, back pain, flank pain, myalgias and neck pain.  Skin:  Negative for itching and rash.  Neurological:  Negative for dizziness, headaches and numbness.  Hematological:  Does not bruise/bleed easily.  Psychiatric/Behavioral:  Negative for depression, sleep disturbance and suicidal ideas. The patient is not nervous/anxious.   All other systems reviewed and are negative.    VITALS:   There were no vitals taken for this visit.  Wt Readings from Last 3 Encounters:  11/23/23 230 lb (104.3 kg)  10/27/23 225 lb 3.2 oz (102.2 kg)  08/28/23 221 lb (100.2 kg)    There is no height or weight on file to calculate BMI.  Performance status (ECOG): 1 - Symptomatic but completely ambulatory  PHYSICAL  EXAM:   Physical Exam Vitals and nursing note reviewed. Exam conducted with a chaperone present.  Constitutional:      Appearance: Normal appearance.  Cardiovascular:     Rate and Rhythm: Normal rate and regular rhythm.     Pulses: Normal pulses.     Heart sounds: Normal heart sounds.  Pulmonary:     Effort: Pulmonary effort is normal.     Breath sounds: Normal breath sounds.  Abdominal:     Palpations: Abdomen is soft. There is no hepatomegaly, splenomegaly or mass.     Tenderness: There is no abdominal tenderness.  Musculoskeletal:     Right lower leg: No edema.     Left lower leg: No edema.  Lymphadenopathy:     Cervical: No cervical adenopathy.  Right cervical: No superficial, deep or posterior cervical adenopathy.    Left cervical: No superficial, deep or posterior cervical adenopathy.     Upper Body:     Right upper body: No supraclavicular or axillary adenopathy.     Left upper body: No supraclavicular or axillary adenopathy.  Neurological:     General: No focal deficit present.     Mental Status: He is alert and oriented to person, place, and time.  Psychiatric:        Mood and Affect: Mood normal.        Behavior: Behavior normal.     LABS:      Latest Ref Rng & Units 01/20/2024   12:26 PM 11/23/2023    4:05 PM 05/26/2023    1:54 PM  CBC  WBC 4.0 - 10.5 K/uL 7.1  6.4  5.6   Hemoglobin 13.0 - 17.0 g/dL 16.1  09.6  04.5   Hematocrit 39.0 - 52.0 % 45.7  43.7  47.2   Platelets 150 - 400 K/uL 201  199  229       Latest Ref Rng & Units 01/20/2024   12:26 PM 11/23/2023    4:05 PM 11/06/2023    1:34 PM  CMP  Glucose 70 - 99 mg/dL 409  811    BUN 8 - 23 mg/dL 15  17    Creatinine 9.14 - 1.24 mg/dL 7.82  9.56    Sodium 213 - 145 mmol/L 137  136    Potassium 3.5 - 5.1 mmol/L 4.3  4.2    Chloride 98 - 111 mmol/L 101  108    CO2 22 - 32 mmol/L 27  24    Calcium  8.9 - 10.3 mg/dL 9.4  8.8    Total Protein 6.5 - 8.1 g/dL 7.0  7.0  6.8   Total Bilirubin 0.0 - 1.2 mg/dL  0.8  0.6  0.5   Alkaline Phos 38 - 126 U/L 48  45  59   AST 15 - 41 U/L 24  29  25    ALT 0 - 44 U/L 32  32  37      No results found for: "CEA1", "CEA" / No results found for: "CEA1", "CEA" No results found for: "PSA1" No results found for: "YQM578" No results found for: "CAN125"  No results found for: "TOTALPROTELP", "ALBUMINELP", "A1GS", "A2GS", "BETS", "BETA2SER", "GAMS", "MSPIKE", "SPEI" Lab Results  Component Value Date   TIBC 304 11/13/2022   FERRITIN 296 11/13/2022   IRONPCTSAT 36 11/13/2022   Lab Results  Component Value Date   LDH 121 01/20/2024     STUDIES:   CT ABDOMEN PELVIS W CONTRAST Addendum Date: 01/20/2024 ******** ADDENDUM #1 ******** Addendum to report: Again seen are 2 small soft tissue nodules identified in the anterior aspect of the right retroperitoneum inferior to the kidney and to completely separate. These lesions measure 1.2 x 1.1 cm and 1.3 x 1.0 cm. No definite evidence of enhancement is identified in the slightly smaller and lower nodule does demonstrate a punctate calcification suggestive of chronicity. Overall the findings appear relatively stable when compared to the prior CT abdomen and pelvis from 11/23/2023. This could represent fat necrosis. Other retroperitoneal lesions are not entirely excluded. Close continued attention on follow-up is advised. The impression to state the following: IMPRESSION: 1.  No acute process is identified within the abdomen or pelvis. 2. Stable appearance of the nodular densities within the fat of the anterior right retroperitoneum separate from and inferior  to the right lower pole. Overall the lesions appear indeterminate but stable from a prior PET/CT dated 06/26/2023. These could represent areas of fat necrosis. On a prior PET/CT from 06/26/2023, these lesions did not demonstrate any FDG activity. Consider further evaluation with contrast-enhanced MRI in 6-12 months. 3.  Additional various incidental findings as above.  Electronically signed by: Edrie Gower MD 01/20/2024 06:23 PM EDT RP Workstation: [id number]   Result Date: 01/20/2024 ******** ORIGINAL REPORT ******** CT ABDOMEN PELVIS W CONTRAST 01/20/2024 1:13 PM CDT CLINICAL HISTORY: Male, 68 years old. right perinephretic soft tissue nodules. F/U right-sided abdominal pain (05/29/2023): 2 areas of soft tissue nodularity within the right lower abdomen, measuring 2.3 x 1.8 x 1.9 cm send 1.5 x 1 x 1.3 cm. These appear to most likely be within the anterior retroperitoneal fat likely just within ; the inferior aspect of the right perinephric space. COMPARISON: CT renal stone from 11/23/2023 PROCEDURE COMMENTS: CT of the abdomen and pelvis was performed following uneventful administration of IV contrast. Oral contrast was not administered prior to the examination. CT was performed using one or more dose reduction techniques including automated exposure control, adjustment of the mA and/or kV according to patient size, and/or use of iterative reconstruction technique.Unless otherwise stated, incidental findings identified in this report do not require routine follow-up. 3-D reconstruction volume rendered imaging was performed on a separate workstation if necessary. Unless otherwise stated, incidental findings identified in this report do not require routine follow-up. FINDINGS: Lower thorax: Mild degenerative changes within bilateral lung bases. No pleural or pericardial effusion. Heart is of normal size. AICD/pacemaker projecting over the right heart. Liver and biliary tree: Liver is normal in morphology. No focal liver lesions are identified. Hepatic, portal, and superior mesenteric veins are patent. No intra- or extrahepatic biliary dilatation. Gallbladder: Cholelithiasis without gallbladder wall thickening or pericholecystic fluid. Spleen: Normal. Enlarged measuring 15.7 cm in greatest craniocaudal dimension. Small splenule seen inferior to the splenic hilum. Pancreas: Normal.  Adrenal glands: Normal. Kidneys and ureters: Symmetric bilateral enhancement. No hydronephrosis. No radiopaque renal calculi. Gastrointestinal tract: No bowel obstruction or wall thickening. Peritoneal cavity: No free fluid or free air. Bladder: Bladder is under distended, therefore incompletely evaluated. Pelvic Organs: Moderately enlarged prostate. Vasculature: Mild scattered calcified and noncalcified atherosclerotic calcifications involving the abdominal aorta. No abdominal aortic aneurysm, dissection or high-grade focal stenosis. IVC is patent. Lymph nodes: No pathologically enlarged lymphadenopathy in the abdomen or pelvis. Abdominal wall: Normal. Musculoskeletal: No suspicious osseous lesions. Mild multilevel degenerative changes of the mid to lower lumbar spine. Posterior transpedicular fusion of L5-S1. IMPRESSION: 1.  No acute process identified within the abdomen or pelvis. Electronically signed by: Edrie Gower MD 01/20/2024 06:15 PM EDT RP Workstation: [id number]

## 2024-01-27 ENCOUNTER — Telehealth (HOSPITAL_COMMUNITY): Payer: Self-pay | Admitting: *Deleted

## 2024-01-27 ENCOUNTER — Inpatient Hospital Stay (HOSPITAL_BASED_OUTPATIENT_CLINIC_OR_DEPARTMENT_OTHER): Payer: Medicare HMO | Admitting: Hematology

## 2024-01-27 ENCOUNTER — Other Ambulatory Visit: Payer: Self-pay | Admitting: *Deleted

## 2024-01-27 DIAGNOSIS — R161 Splenomegaly, not elsewhere classified: Secondary | ICD-10-CM | POA: Diagnosis not present

## 2024-01-27 DIAGNOSIS — R1900 Intra-abdominal and pelvic swelling, mass and lump, unspecified site: Secondary | ICD-10-CM

## 2024-01-27 DIAGNOSIS — M7989 Other specified soft tissue disorders: Secondary | ICD-10-CM

## 2024-01-27 NOTE — Telephone Encounter (Signed)
 Attempted to call patient regarding upcoming cardiac PET appointment. Left message on voicemail with name and callback number Kerri Peed RN Navigator Cardiac Imaging [facility] [phone] Office  Advised to avoid caffeine for 12 hours prior to test, hold imdur  and silodosin .

## 2024-01-27 NOTE — Telephone Encounter (Signed)
 Received call from patient regarding upcoming cardiac imaging study; pt verbalizes understanding of appt date/time, parking situation and where to check in, pre-test NPO status and medications ordered, and verified current allergies; name and call back number provided for further questions should they arise Kerri Peed RN Navigator Cardiac Imaging [facility] [phone] office [phone] cell  Patient aware to avoid caffeine for 12 hours prior, hold imdur  and silodosin .

## 2024-01-28 ENCOUNTER — Encounter (HOSPITAL_COMMUNITY)
Admission: RE | Admit: 2024-01-28 | Discharge: 2024-01-28 | Disposition: A | Discharge status: Home / Self Care | Payer: Medicare HMO | Source: Ambulatory Visit | Attending: Internal Medicine | Admitting: Internal Medicine

## 2024-01-28 DIAGNOSIS — E785 Hyperlipidemia, unspecified: Secondary | ICD-10-CM | POA: Diagnosis not present

## 2024-01-28 DIAGNOSIS — I480 Paroxysmal atrial fibrillation: Secondary | ICD-10-CM

## 2024-01-28 DIAGNOSIS — D6869 Other thrombophilia: Secondary | ICD-10-CM

## 2024-01-28 DIAGNOSIS — I10 Essential (primary) hypertension: Secondary | ICD-10-CM | POA: Diagnosis not present

## 2024-01-28 DIAGNOSIS — Z950 Presence of cardiac pacemaker: Secondary | ICD-10-CM | POA: Diagnosis present

## 2024-01-28 DIAGNOSIS — I700 Atherosclerosis of aorta: Secondary | ICD-10-CM

## 2024-01-28 DIAGNOSIS — Z951 Presence of aortocoronary bypass graft: Secondary | ICD-10-CM

## 2024-01-28 DIAGNOSIS — Z6835 Body mass index (BMI) 35.0-35.9, adult: Secondary | ICD-10-CM

## 2024-01-28 DIAGNOSIS — R079 Chest pain, unspecified: Secondary | ICD-10-CM

## 2024-01-28 DIAGNOSIS — N182 Chronic kidney disease, stage 2 (mild): Secondary | ICD-10-CM | POA: Diagnosis present

## 2024-01-28 MED ORDER — RUBIDIUM RB82 GENERATOR (RUBYFILL)
27.0700 | PACK | Freq: Once | INTRAVENOUS | Status: AC
  Administered 2024-01-28: 27.07 via INTRAVENOUS

## 2024-01-28 MED ORDER — RUBIDIUM RB82 GENERATOR (RUBYFILL)
26.8600 | PACK | Freq: Once | INTRAVENOUS | Status: AC
  Administered 2024-01-28: 26.86 via INTRAVENOUS

## 2024-01-28 MED ORDER — REGADENOSON 0.4 MG/5ML IV SOLN
INTRAVENOUS | Status: AC
  Filled 2024-01-28: qty 5

## 2024-01-28 MED ORDER — REGADENOSON 0.4 MG/5ML IV SOLN
0.4000 mg | Freq: Once | INTRAVENOUS | Status: AC
  Administered 2024-01-28: 0.4 mg via INTRAVENOUS

## 2024-01-29 LAB — NM PET CT CARDIAC PERFUSION MULTI W/ABSOLUTE BLOODFLOW
LV dias vol: 115 mL (ref 62–150)
LV sys vol: 41 mL
MBFR: 2.39
Nuc Rest EF: 57 %
Nuc Stress EF: 64 %
Peak HR: 80 {beats}/min
Rest HR: 69 {beats}/min
Rest MBF: 0.56 ml/g/min
Rest Nuclear Isotope Dose: 27.1 mCi
ST Depression (mm): 0 mm
Stress MBF: 1.34 ml/g/min
Stress Nuclear Isotope Dose: 26.9 mCi
TID: 1.18

## 2024-01-30 ENCOUNTER — Ambulatory Visit: Payer: Self-pay | Admitting: Internal Medicine

## 2024-02-02 NOTE — Progress Notes (Signed)
 Remote pacemaker transmission.

## 2024-02-02 NOTE — Addendum Note (Signed)
 Addended by: Edra Govern D on: 02/02/2024 12:55 PM   Modules accepted: Orders

## 2024-02-03 MED ORDER — AMLODIPINE BESYLATE 2.5 MG PO TABS: 2.5000 mg | ORAL_TABLET | Freq: Every day | ORAL | 3 refills | Status: AC

## 2024-02-03 NOTE — Telephone Encounter (Signed)
 Reviewed results with patient.  He is willing to start amlodipine 2.5 mg daily at bedtime.   Wanted to report that he is not able to afford Jardiance .  The copay is over $140 per month.   I adv I would reach out to med assist pool to see what can be offered for him.  Also on Xarelto  prescribed by cardiology.  I am forwarding to med assistance team.

## 2024-02-04 ENCOUNTER — Telehealth: Payer: Self-pay | Admitting: Pharmacy Technician

## 2024-02-04 ENCOUNTER — Other Ambulatory Visit (HOSPITAL_COMMUNITY): Payer: Self-pay

## 2024-02-04 NOTE — Telephone Encounter (Signed)
 Patient Advocate Encounter   The patient was approved for a [employer] grant that will help cover the cost of Jardiance  Total amount awarded, 4500.00.   Effective: 01/05/24 - 01/03/25   [id number] [id number] [id number]  [employer] ID: [id number]   Pharmacy provided with approval and processing information. Patient informed via mychart

## 2024-02-18 ENCOUNTER — Ambulatory Visit: Admitting: Urology

## 2024-02-18 VITALS — BP 115/72 | HR 65

## 2024-02-18 DIAGNOSIS — N400 Enlarged prostate without lower urinary tract symptoms: Secondary | ICD-10-CM

## 2024-02-18 DIAGNOSIS — R1900 Intra-abdominal and pelvic swelling, mass and lump, unspecified site: Secondary | ICD-10-CM | POA: Diagnosis not present

## 2024-02-18 DIAGNOSIS — R351 Nocturia: Secondary | ICD-10-CM

## 2024-02-18 LAB — URINALYSIS, ROUTINE W REFLEX MICROSCOPIC
Bilirubin, UA: NEGATIVE
Glucose, UA: NEGATIVE
Ketones, UA: NEGATIVE
Leukocytes,UA: NEGATIVE
Nitrite, UA: NEGATIVE
Protein,UA: NEGATIVE
RBC, UA: NEGATIVE
Specific Gravity, UA: 1.01 (ref 1.005–1.030)
Urobilinogen, Ur: 0.2 mg/dL (ref 0.2–1.0)
pH, UA: 6.5 (ref 5.0–7.5)

## 2024-02-18 MED ORDER — SILODOSIN 8 MG PO CAPS: 8.0000 mg | ORAL_CAPSULE | Freq: Every day | ORAL | 3 refills | Status: AC

## 2024-02-18 NOTE — Progress Notes (Signed)
 02/18/2024 1:55 PM   [name] [name] 11-Feb-1956 [id number]  Referring provider: Sinda Duel, [place] [address] [place],  [place] [address]  Retroperitoneal masses   HPI: [name] is a 80GN is a here for followup for BPh with Nocturia and new retroperitoneal masses. CT 5/6 shows two 1.2 cm right retroperitoneal masses favored to be fat necrosis. IPSS 13 QOL 2 on rapaflo  8mg . Urine stream strong. No straining to urinate. Nocturia 1-2x depending on fluid consumption. No other complaints today   PMH: Past Medical History:  Diagnosis Date   ALLERGIC RHINITIS 05/14/2010   Qualifier: Diagnosis of  By: Doralee Gallop PA, Dawn     Asthma    Asthma    Atrial fibrillation [facility])    new November 2016   Bipolar 1 disorder [facility])    "put me on this after I got out of drug rehab 11/18/2005 and couldn't sleep; added anxiety RX and have been sleeping fine since"   Bradycardia    Very limited Wenkebach on event recorder November 2010   CAD (coronary artery disease) 2006   3v CAD >> s/p CABG in [place], patent LIMA to LAD and SVG to diagonal with 40% stenosis 2010 // Nuc 2/14: low risk, EF 56 // LHC 3/16: patent L-LAD, patent S-Dx // Echo 3/16: mild conc LVH, EF 60-65, Gr 1 DD, trivial TR, mild LAE // LHC 12/19: oLAD 100, pLCx 20, S-D1 patent, L-LAD patent, EF 45   Cardiac pacemaker in situ 08/18/2018   CHB (complete heart block) (HCC) 12/02/2014   Chest pain with moderate risk of acute coronary syndrome    Chronic systolic CHF (congestive heart failure) (HCC) 09/15/2018   EF 45 by LHC in 12/19 // Echo 09/2018: inf HK, EF 45-50, mod LAE   Dyslipidemia    mixed    ED (erectile dysfunction)    Ejection fraction    EF 50%, catheterization, September, 2010   //   EF 60-65%, echo, December 03, 2014    Essential hypertension    GERD (gastroesophageal reflux disease)    Gout    History of stomach ulcers 1980's   Hx of CABG    2006, [place]    Memory loss-SVD on MRI March 2015 11/18/2013    Mixed hyperlipidemia    pt states "they took me off q med they had me on for high cholesterol; my good cholesterol is low" (09/13/2015)   Myocardial infarction (HCC) 2006   OSA (obstructive sleep apnea)    patient denies, stated had a sleep study and he was told he did not have     PAF (paroxysmal atrial fibrillation) (HCC) 08/18/2018   Palpitations    Event recorder November, 2010, no significant arrhythmias    Pneumonia 2015   Pneumonia 1990's X 4 in one year   "double pneumonia"   Presence of permanent cardiac pacemaker    Second degree AV block 09/13/2015   Second degree AV block, Mobitz type I 03/10/2013   Patient had Mobitz 1 heart block that was asymptomatic. This resolved off beta blockade while in the hospital March, 2016. Plan to keep the patient off beta blockers. Patient will be seen in follow-up by Dr. Carolynne Citron and the [place] office.    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[facility])     Surgical History: Past Surgical History:  Procedure Laterality Date   BACK SURGERY     BIOPSY  07/28/2023   Procedure: BIOPSY;  Surgeon: Suzette Espy, MD;  [facility];  Service: Endoscopy;;   CARDIAC CATHETERIZATION     "I've had quite a few; don't remember any stents" (09/13/2015)   COLONOSCOPY WITH PROPOFOL  N/A 07/28/2023   Procedure: COLONOSCOPY WITH PROPOFOL ;  Surgeon: Suzette Espy, MD;  Location: [facility];  Service: Endoscopy;  Laterality: N/A;  1:15 pm, asa 3   CORONARY ANGIOPLASTY     CORONARY ARTERY BYPASS GRAFT  2006   "CABG X2, in [place]"   EP IMPLANTABLE DEVICE N/A 09/13/2015   Procedure: Pacemaker Implant;  Surgeon: Jolly Needle, MD;  Location: [facility] INVASIVE CV LAB;  Service: Cardiovascular;  Laterality: N/A;   ESOPHAGOGASTRODUODENOSCOPY (EGD) WITH PROPOFOL  N/A 07/28/2023   Procedure: ESOPHAGOGASTRODUODENOSCOPY (EGD) WITH PROPOFOL ;  Surgeon: Suzette Espy, MD;  Location: [facility];  Service: Endoscopy;  Laterality: N/A;   INSERT / REPLACE / REMOVE  PACEMAKER  09/13/2015   [employer]   LEFT HEART CATH AND CORS/GRAFTS ANGIOGRAPHY N/A 08/26/2018   Procedure: LEFT HEART CATH AND CORS/GRAFTS ANGIOGRAPHY;  Surgeon: Millicent Ally, MD;  Location: [facility] INVASIVE CV LAB;  Service: Cardiovascular;  Laterality: N/A;   LEFT HEART CATH AND CORS/GRAFTS ANGIOGRAPHY N/A 01/14/2023   Procedure: LEFT HEART CATH AND CORS/GRAFTS ANGIOGRAPHY;  Surgeon: Lucendia Rusk, MD;  Location: [facility] INVASIVE CV LAB;  Service: Cardiovascular;  Laterality: N/A;   LEFT HEART CATHETERIZATION WITH CORONARY/GRAFT ANGIOGRAM N/A 12/05/2014   Procedure: LEFT HEART CATHETERIZATION WITH Estella Helling;  Surgeon: Peter M Swaziland, MD;  Location: [facility] CATH LAB;  Service: Cardiovascular;  Laterality: N/A;   LEG SURGERY Left 1961   "had 5 boils come up on my leg; they had to cut them out and put a drainage tube in there"   LUMBAR DISC SURGERY  X 3   "ruptured disc"   MAXIMUM ACCESS (MAS)POSTERIOR LUMBAR INTERBODY FUSION (PLIF) 1 LEVEL  ~ 2003   "L4-5; put rods & screws in"    Home Medications:  Allergies as of 02/18/2024       Reactions   Sulfonamide Derivatives Hives   Zolpidem  Hives   Zolpidem  Tartrate Other (See Comments)        Medication List        Accurate as of February 18, 2024  1:55 PM. If you have any questions, ask your nurse or doctor.          allopurinol  300 MG tablet Commonly known as: ZYLOPRIM  Take 300 mg by mouth every evening.   amLODipine  2.5 MG tablet Commonly known as: NORVASC  Take 1 tablet (2.5 mg total) by mouth at bedtime.   colchicine  0.6 MG tablet Take 0.6 mg by mouth daily as needed (Gout).   cyclobenzaprine  10 MG tablet Commonly known as: FLEXERIL  Take 1 tablet (10 mg total) by mouth 2 (two) times daily as needed for muscle spasms.   empagliflozin  10 MG Tabs tablet Commonly known as: Jardiance  Take 1 tablet (10 mg total) by mouth daily before breakfast.   empagliflozin  10 MG Tabs tablet Commonly known as: Jardiance  Take 1  tablet (10 mg total) by mouth daily.   FISH OIL PO Take 900 mg by mouth daily.   fluticasone 50 MCG/ACT nasal spray Commonly known as: FLONASE Place 2 sprays into both nostrils daily.   isosorbide  mononitrate 30 MG 24 hr tablet Commonly known as: IMDUR  Take 0.5 tablets (15 mg total) by mouth daily.   loratadine  10 MG tablet Commonly known as: CLARITIN  Take 10 mg by mouth daily.   Magnesium 300 MG Caps Take 300 mg by mouth daily.  Melatonin 10 MG Subl 1 tablet under the tongue and allow to dissolve at bedtime as needed Sublingual at bedtime   metoprolol  succinate 25 MG 24 hr tablet Commonly known as: TOPROL -XL TAKE 1 TABLET BY MOUTH EVERY DAY. MAY TAKE 1/2 TO 1 TABLET EXTRA FOR PALITATTIONS   nitroGLYCERIN  0.4 MG/SPRAY spray Commonly known as: NITROLINGUAL  USE 1 SPRAY UNDER THE TONGUE EVERY 5 MINUTES AS NEEDED FOR CHEST PAIN   omeprazole  40 MG capsule Commonly known as: PRILOSEC Take 1 capsule (40 mg total) by mouth daily.   ondansetron  4 MG tablet Commonly known as: ZOFRAN  Take 1 tablet (4 mg total) by mouth every 8 (eight) hours as needed for nausea or vomiting.   POTASSIUM PO Take 1 tablet by mouth daily.   QUEtiapine  300 MG tablet Commonly known as: SEROQUEL  Take 300 mg by mouth at bedtime.   rivaroxaban  20 MG Tabs tablet Commonly known as: XARELTO  Take 1 tablet (20 mg total) by mouth daily with supper.   rosuvastatin  10 MG tablet Commonly known as: CRESTOR  TAKE 1 TABLET BY MOUTH EVERY DAY   silodosin  8 MG Caps capsule Commonly known as: RAPAFLO  TAKE 1 CAPSULE BY MOUTH EVERYDAY AT BEDTIME   temazepam 30 MG capsule Commonly known as: RESTORIL Take 30 mg by mouth at bedtime.   valsartan -hydrochlorothiazide  320-12.5 MG tablet Commonly known as: Diovan  HCT Take 1 tablet by mouth daily.        Allergies:  Allergies  Allergen Reactions   Sulfonamide Derivatives Hives   Zolpidem  Hives   Zolpidem  Tartrate Other (See Comments)    Family  History: Family History  Problem Relation Age of Onset   COPD Mother    Lung cancer Mother    Stroke Mother    Heart attack Mother    Heart disease Father    Heart attack Father    Hyperlipidemia Brother    Hypertension Brother    Hypertension Brother    Hypertension Sister     Social History:  reports that he quit smoking about 6 years ago. His smoking use included cigarettes. He started smoking about 23 years ago. He has a 34 pack-year smoking history. His smokeless tobacco use includes snuff. He reports that he does not currently use alcohol. He reports that he does not currently use drugs.  ROS: All other review of systems were reviewed and are negative except what is noted above in HPI  Physical Exam: BP 115/72   Pulse 65   Constitutional:  Alert and oriented, No acute distress. HEENT: [place] AT, moist mucus membranes.  Trachea midline, no masses. Cardiovascular: No clubbing, cyanosis, or edema. Respiratory: Normal respiratory effort, no increased work of breathing. GI: Abdomen is soft, nontender, nondistended, no abdominal masses GU: No CVA tenderness.  Lymph: No cervical or inguinal lymphadenopathy. Skin: No rashes, bruises or suspicious lesions. Neurologic: Grossly intact, no focal deficits, moving all 4 extremities. Psychiatric: Normal mood and affect.  Laboratory Data: Lab Results  Component Value Date   WBC 7.1 01/20/2024   HGB 15.7 01/20/2024   HCT 45.7 01/20/2024   MCV 91.8 01/20/2024   PLT 201 01/20/2024    Lab Results  Component Value Date   CREATININE 1.27 (H) 01/20/2024    No results found for: "PSA"  No results found for: "TESTOSTERONE"  Lab Results  Component Value Date   HGBA1C 5.3 12/02/2014    Urinalysis    Component Value Date/Time   COLORURINE YELLOW 11/23/2023 1539   APPEARANCEUR CLEAR 11/23/2023 1539   APPEARANCEUR  Clear 07/09/2021 1154   LABSPEC 1.012 11/23/2023 1539   PHURINE 6.0 11/23/2023 1539   GLUCOSEU >=500 (A) 11/23/2023  1539   HGBUR NEGATIVE 11/23/2023 1539   BILIRUBINUR NEGATIVE 11/23/2023 1539   BILIRUBINUR Negative 07/09/2021 1154   KETONESUR NEGATIVE 11/23/2023 1539   PROTEINUR NEGATIVE 11/23/2023 1539   NITRITE NEGATIVE 11/23/2023 1539   LEUKOCYTESUR NEGATIVE 11/23/2023 1539    Lab Results  Component Value Date   LABMICR Comment 07/09/2021   BACTERIA NONE SEEN 11/23/2023    Pertinent Imaging: CT 01/20/2024: Images reviewed and discussed with the patien  No results found for this or any previous visit.  No results found for this or any previous visit.  No results found for this or any previous visit.  No results found for this or any previous visit.  No results found for this or any previous visit.  No results found for this or any previous visit.  No results found for this or any previous visit.  Results for orders placed during the hospital encounter of 11/23/23  CT Renal Stone Study  Narrative CLINICAL DATA:  Abdominal/flank pain, stone suspected  EXAM: CT ABDOMEN AND PELVIS WITHOUT CONTRAST  TECHNIQUE: Multidetector CT imaging of the abdomen and pelvis was performed following the standard protocol without IV contrast.  RADIATION DOSE REDUCTION: This exam was performed according to the departmental dose-optimization program which includes automated exposure control, adjustment of the mA and/or kV according to patient size and/or use of iterative reconstruction technique.  COMPARISON:  05/29/2023  FINDINGS: Lower chest: Small pulmonary nodules in the lung bases. The largest is in the right lower lobe measuring 5 mm, series 4, image 5. Nodules are not significantly changed from prior exam allowing for differences in caliper placement. No specific nodule follow-up is needed.  Hepatobiliary: Mild diffuse hepatic steatosis. No evidence of focal liver lesion. Question of subtle capsular nodularity of the liver. Gallstones without gallbladder inflammation. No biliary  dilatation.  Pancreas: No ductal dilatation or inflammation.  Spleen: Chronic splenomegaly, the spleen spans 18.4 cm AP, unchanged. Splenule at the hilum.  Adrenals/Urinary Tract: No adrenal nodule. No hydronephrosis or renal calculi. No evidence of focal renal lesion. Nondistended urinary bladder.  Stomach/Bowel: Unremarkable appearance of the stomach. There is no bowel obstruction or inflammation. Diminutive appendix tentatively visualized. Small to moderate colonic stool burden. Occasional colonic diverticula without diverticulitis. Umbilical hernia contains a short segment of small bowel.  Vascular/Lymphatic: Aortic atherosclerosis. No aortic aneurysm. 10 mm periportal node series 2, image 12, unchanged. Scattered small retroperitoneal lymph nodes not enlarged by size criteria.  Reproductive: Enlarged prostate spans 6.3 cm transverse.  Other: Right retroperitoneal/perarenal soft tissue nodules measuring 13 mm short axis series 2, image 51 and 8 mm short axis series 2, image 52. These are not significantly changed from prior exam. These were not hypermetabolic on intervening PET. No ascites. Small umbilical hernia contains short segment of small bowel. Minimal fat in the inguinal canals.  Musculoskeletal: Postsurgical change at L5-S1. There are no acute or suspicious osseous abnormalities.  IMPRESSION: 1. No acute abnormality in the abdomen/pelvis. No nephrolithiasis or hydronephrosis. 2. Chronic splenomegaly. 3. Hepatic steatosis. Question of subtle capsular nodularity of the liver, can be seen with cirrhosis. 4. Cholelithiasis without gallbladder inflammation. 5. Small umbilical hernia contains a short segment of small bowel. No bowel obstruction or inflammation. 6. Enlarged prostate. 7. Right retroperitoneal/perarenal soft tissue nodules are unchanged from prior, without hypermetabolism on intervening PET.  Aortic Atherosclerosis (ICD10-I70.0).   Electronically  Signed By:  Chadwick Colonel M.D. On: 11/23/2023 16:22   Assessment & Plan:    1. BPH without obstruction/lower urinary tract symptoms (Primary) Continue rapaflo  8mg  - Urinalysis, Routine w reflex microscopic  2. Nocturia -continue rapaflo  8mg   3. Retroperitoneal mass -We will continue surveillance. He will followup in 07/2024 with repeat CT   No follow-ups on file.  Johnie Nailer, MD  [facility] [place]

## 2024-02-24 ENCOUNTER — Encounter: Payer: Self-pay | Admitting: Urology

## 2024-02-24 NOTE — Patient Instructions (Signed)

## 2024-03-22 ENCOUNTER — Ambulatory Visit: Payer: BC Managed Care – PPO

## 2024-03-22 DIAGNOSIS — I480 Paroxysmal atrial fibrillation: Secondary | ICD-10-CM

## 2024-03-23 LAB — CUP PACEART REMOTE DEVICE CHECK
Battery Remaining Longevity: 13 mo
Battery Voltage: 2.89 V
Brady Statistic AP VP Percent: 16.42 %
Brady Statistic AP VS Percent: 0 %
Brady Statistic AS VP Percent: 83.52 %
Brady Statistic AS VS Percent: 0.05 %
Brady Statistic RA Percent Paced: 16.2 %
Brady Statistic RV Percent Paced: 99.94 %
Date Time Interrogation Session: 20250707134158
Implantable Lead Connection Status: 753985
Implantable Lead Connection Status: 753985
Implantable Lead Implant Date: 20161228
Implantable Lead Implant Date: 20161228
Implantable Lead Location: 753859
Implantable Lead Location: 753860
Implantable Lead Model: 5076
Implantable Lead Model: 5076
Implantable Pulse Generator Implant Date: 20161228
Lead Channel Impedance Value: 323 Ohm
Lead Channel Impedance Value: 361 Ohm
Lead Channel Impedance Value: 361 Ohm
Lead Channel Impedance Value: 418 Ohm
Lead Channel Pacing Threshold Amplitude: 0.875 V
Lead Channel Pacing Threshold Amplitude: 1.125 V
Lead Channel Pacing Threshold Pulse Width: 0.4 ms
Lead Channel Pacing Threshold Pulse Width: 0.4 ms
Lead Channel Sensing Intrinsic Amplitude: 0.875 mV
Lead Channel Sensing Intrinsic Amplitude: 0.875 mV
Lead Channel Sensing Intrinsic Amplitude: 7.625 mV
Lead Channel Sensing Intrinsic Amplitude: 7.625 mV
Lead Channel Setting Pacing Amplitude: 2.5 V
Lead Channel Setting Pacing Amplitude: 2.5 V
Lead Channel Setting Pacing Pulse Width: 0.4 ms
Lead Channel Setting Sensing Sensitivity: 2.8 mV
Zone Setting Status: 755011
Zone Setting Status: 755011

## 2024-03-25 ENCOUNTER — Ambulatory Visit: Payer: Self-pay | Admitting: Internal Medicine

## 2024-06-21 ENCOUNTER — Ambulatory Visit (INDEPENDENT_AMBULATORY_CARE_PROVIDER_SITE_OTHER): Payer: BC Managed Care – PPO

## 2024-06-21 DIAGNOSIS — I480 Paroxysmal atrial fibrillation: Secondary | ICD-10-CM | POA: Diagnosis not present

## 2024-06-23 LAB — CUP PACEART REMOTE DEVICE CHECK
Battery Remaining Longevity: 9 mo
Battery Voltage: 2.88 V
Brady Statistic AP VP Percent: 17.49 %
Brady Statistic AP VS Percent: 0 %
Brady Statistic AS VP Percent: 82.46 %
Brady Statistic AS VS Percent: 0.04 %
Brady Statistic RA Percent Paced: 17.14 %
Brady Statistic RV Percent Paced: 99.95 %
Date Time Interrogation Session: 20251007135451
Implantable Lead Connection Status: 753985
Implantable Lead Connection Status: 753985
Implantable Lead Implant Date: 20161228
Implantable Lead Implant Date: 20161228
Implantable Lead Location: 753859
Implantable Lead Location: 753860
Implantable Lead Model: 5076
Implantable Lead Model: 5076
Implantable Pulse Generator Implant Date: 20161228
Lead Channel Impedance Value: 361 Ohm
Lead Channel Impedance Value: 399 Ohm
Lead Channel Impedance Value: 399 Ohm
Lead Channel Impedance Value: 456 Ohm
Lead Channel Pacing Threshold Amplitude: 0.875 V
Lead Channel Pacing Threshold Amplitude: 1.125 V
Lead Channel Pacing Threshold Pulse Width: 0.4 ms
Lead Channel Pacing Threshold Pulse Width: 0.4 ms
Lead Channel Sensing Intrinsic Amplitude: 14.875 mV
Lead Channel Sensing Intrinsic Amplitude: 14.875 mV
Lead Channel Sensing Intrinsic Amplitude: 2.125 mV
Lead Channel Sensing Intrinsic Amplitude: 2.125 mV
Lead Channel Setting Pacing Amplitude: 2.5 V
Lead Channel Setting Pacing Amplitude: 2.5 V
Lead Channel Setting Pacing Pulse Width: 0.4 ms
Lead Channel Setting Sensing Sensitivity: 2.8 mV
Zone Setting Status: 755011
Zone Setting Status: 755011

## 2024-06-24 ENCOUNTER — Ambulatory Visit: Payer: Self-pay | Admitting: Internal Medicine

## 2024-06-24 NOTE — Progress Notes (Signed)
 Remote PPM Transmission

## 2024-06-28 NOTE — Progress Notes (Signed)
 Remote PPM Transmission

## 2024-07-09 ENCOUNTER — Other Ambulatory Visit: Payer: Self-pay | Admitting: Physician Assistant

## 2024-07-12 ENCOUNTER — Encounter (HOSPITAL_COMMUNITY): Payer: Self-pay

## 2024-07-12 ENCOUNTER — Other Ambulatory Visit: Payer: Self-pay

## 2024-07-12 ENCOUNTER — Emergency Department (HOSPITAL_COMMUNITY)
Admission: EM | Admit: 2024-07-12 | Discharge: 2024-07-12 | Disposition: A | Discharge status: Home / Self Care | Attending: Emergency Medicine | Admitting: Emergency Medicine

## 2024-07-12 ENCOUNTER — Emergency Department (HOSPITAL_COMMUNITY)

## 2024-07-12 DIAGNOSIS — I10 Essential (primary) hypertension: Secondary | ICD-10-CM | POA: Diagnosis not present

## 2024-07-12 DIAGNOSIS — E119 Type 2 diabetes mellitus without complications: Secondary | ICD-10-CM | POA: Insufficient documentation

## 2024-07-12 DIAGNOSIS — R079 Chest pain, unspecified: Secondary | ICD-10-CM | POA: Diagnosis present

## 2024-07-12 DIAGNOSIS — I2510 Atherosclerotic heart disease of native coronary artery without angina pectoris: Secondary | ICD-10-CM | POA: Insufficient documentation

## 2024-07-12 DIAGNOSIS — Z950 Presence of cardiac pacemaker: Secondary | ICD-10-CM | POA: Insufficient documentation

## 2024-07-12 DIAGNOSIS — Z7901 Long term (current) use of anticoagulants: Secondary | ICD-10-CM | POA: Insufficient documentation

## 2024-07-12 DIAGNOSIS — Z79899 Other long term (current) drug therapy: Secondary | ICD-10-CM | POA: Diagnosis not present

## 2024-07-12 DIAGNOSIS — R0789 Other chest pain: Secondary | ICD-10-CM | POA: Diagnosis not present

## 2024-07-12 LAB — COMPREHENSIVE METABOLIC PANEL WITH GFR
ALT: 22 U/L (ref 0–44)
AST: 20 U/L (ref 15–41)
Albumin: 4.8 g/dL (ref 3.5–5.0)
Alkaline Phosphatase: 61 U/L (ref 38–126)
Anion gap: 12 (ref 5–15)
BUN: 25 mg/dL — ABNORMAL HIGH (ref 8–23)
CO2: 27 mmol/L (ref 22–32)
Calcium: 9.7 mg/dL (ref 8.9–10.3)
Chloride: 100 mmol/L (ref 98–111)
Creatinine, Ser: 1.33 mg/dL — ABNORMAL HIGH (ref 0.61–1.24)
GFR, Estimated: 59 mL/min — ABNORMAL LOW (ref >60)
Glucose, Bld: 118 mg/dL — ABNORMAL HIGH (ref 70–99)
Potassium: 4.6 mmol/L (ref 3.5–5.1)
Sodium: 138 mmol/L (ref 135–145)
Total Bilirubin: 0.8 mg/dL (ref 0.0–1.2)
Total Protein: 7.2 g/dL (ref 6.5–8.1)

## 2024-07-12 LAB — CBC WITH DIFFERENTIAL/PLATELET
Abs Immature Granulocytes: 0.03 K/uL (ref 0.00–0.07)
Basophils Absolute: 0.1 K/uL (ref 0.0–0.1)
Basophils Relative: 1 %
Eosinophils Absolute: 0 K/uL (ref 0.0–0.5)
Eosinophils Relative: 1 %
HCT: 44.7 % (ref 39.0–52.0)
Hemoglobin: 15.1 g/dL (ref 13.0–17.0)
Immature Granulocytes: 0 %
Lymphocytes Relative: 12 %
Lymphs Abs: 1 K/uL (ref 0.7–4.0)
MCH: 30.3 pg (ref 26.0–34.0)
MCHC: 33.8 g/dL (ref 30.0–36.0)
MCV: 89.6 fL (ref 80.0–100.0)
Monocytes Absolute: 0.6 K/uL (ref 0.1–1.0)
Monocytes Relative: 7 %
Neutro Abs: 6.9 K/uL (ref 1.7–7.7)
Neutrophils Relative %: 79 %
Platelets: 225 K/uL (ref 150–400)
RBC: 4.99 MIL/uL (ref 4.22–5.81)
RDW: 14.1 % (ref 11.5–15.5)
WBC: 8.7 K/uL (ref 4.0–10.5)
nRBC: 0 % (ref 0.0–0.2)

## 2024-07-12 LAB — TROPONIN T, HIGH SENSITIVITY
Troponin T High Sensitivity: 19 ng/L (ref 0–19)
Troponin T High Sensitivity: 18 ng/L (ref 0–19)

## 2024-07-12 MED ORDER — KETOROLAC TROMETHAMINE 15 MG/ML IJ SOLN
15.0000 mg | Freq: Once | INTRAMUSCULAR | Status: AC
  Administered 2024-07-12: 15 mg via INTRAVENOUS
  Filled 2024-07-12: qty 1

## 2024-07-12 NOTE — Discharge Instructions (Addendum)
 As discussed, your evaluation today has been largely reassuring.  But, it is important that you monitor your condition carefully, and do not hesitate to return to the ED if you develop new, or concerning changes in your condition. ? ?Otherwise, please follow-up with your physician for appropriate ongoing care. ? ?

## 2024-07-12 NOTE — ED Provider Notes (Signed)
 [place] EMERGENCY DEPARTMENT AT [facility] Provider Note   CSN: [id number] Arrival date & time: 07/12/24  1402     Patient presents with: Chest Pain   [name] [name] is a 68 y.o. male.   HPI  Adult male with multiple medical problems presents with 4 days of chest pain. Previous history of hypertension, diabetes, Is on Xarelto .  He is here with his wife who assist with the history.  Over the past 4 days patient has taken some nitroglycerin  with moderate relief, and now his pain is negligible. Patient has complaints of neck pain, seemingly chronic.  No dyspnea, no fever    Prior to Admission medications   Medication Sig Start Date End Date Taking? Authorizing Provider  allopurinol  (ZYLOPRIM ) 300 MG tablet Take 300 mg by mouth every evening.    [provider]  amLODipine  (NORVASC ) 2.5 MG tablet Take 1 tablet (2.5 mg total) by mouth at bedtime. 02/03/24   Thukkani, Arun K, MD  colchicine  0.6 MG tablet Take 0.6 mg by mouth daily as needed (Gout). 06/12/15   [provider]  cyclobenzaprine  (FLEXERIL ) 10 MG tablet Take 1 tablet (10 mg total) by mouth 2 (two) times daily as needed for muscle spasms. 11/23/23   Nivia Colon, PA-C  empagliflozin  (JARDIANCE ) 10 MG TABS tablet Take 1 tablet (10 mg total) by mouth daily before breakfast. 10/27/23   Thukkani, Arun K, MD  empagliflozin  (JARDIANCE ) 10 MG TABS tablet Take 1 tablet (10 mg total) by mouth daily. 10/27/23   Thukkani, Arun K, MD  fluticasone (FLONASE) 50 MCG/ACT nasal spray Place 2 sprays into both nostrils daily.    [provider]  isosorbide  mononitrate (IMDUR ) 30 MG 24 hr tablet Take 0.5 tablets (15 mg total) by mouth daily. 06/20/23 09/18/23  Lucien Orren SAILOR, PA-C  loratadine  (CLARITIN ) 10 MG tablet Take 10 mg by mouth daily.    [provider]  Magnesium 300 MG CAPS Take 300 mg by mouth daily.    [provider]  Melatonin 10 MG SUBL 1 tablet under the tongue and allow to  dissolve at bedtime as needed Sublingual at bedtime    [provider]  metoprolol  succinate (TOPROL -XL) 25 MG 24 hr tablet TAKE 1 TABLET BY MOUTH EVERY DAY. MAY TAKE 1/2 TO 1 TABLET EXTRA FOR PALITATTIONS 12/28/19   Waddell Danelle ORN, MD  nitroGLYCERIN  (NITROLINGUAL ) 0.4 MG/SPRAY spray USE 1 SPRAY UNDER THE TONGUE EVERY 5 MINUTES AS NEEDED FOR CHEST PAIN 06/20/23   Lucien Orren SAILOR, PA-C  Omega-3 Fatty Acids (FISH OIL PO) Take 900 mg by mouth daily.    [provider]  omeprazole  (PRILOSEC) 40 MG capsule Take 1 capsule (40 mg total) by mouth daily. 05/26/23   Rudy Josette RAMAN, PA-C  ondansetron  (ZOFRAN ) 4 MG tablet Take 1 tablet (4 mg total) by mouth every 8 (eight) hours as needed for nausea or vomiting. 05/26/23   Harper, Kristen S, PA-C  POTASSIUM PO Take 1 tablet by mouth daily.    [provider]  QUEtiapine  (SEROQUEL ) 300 MG tablet Take 300 mg by mouth at bedtime.    [provider]  rivaroxaban  (XARELTO ) 20 MG TABS tablet Take 1 tablet (20 mg total) by mouth daily with supper. 01/15/23   Dann Candyce RAMAN, MD  rosuvastatin  (CRESTOR ) 10 MG tablet TAKE 1 TABLET BY MOUTH EVERY DAY 12/04/20   Waddell Danelle ORN, MD  silodosin  (RAPAFLO ) 8 MG CAPS capsule Take 1 capsule (8 mg total) by mouth  at bedtime. 02/18/24   McKenzie, Belvie CROME, MD  temazepam (RESTORIL) 30 MG capsule Take 30 mg by mouth at bedtime.  02/20/16   [provider]  valsartan -hydrochlorothiazide  (DIOVAN  HCT) 320-12.5 MG tablet Take 1 tablet by mouth daily. 06/20/23   Lucien Orren SAILOR, PA-C    Allergies: Sulfonamide derivatives, Zolpidem , and Zolpidem  tartrate    Review of Systems  Updated Vital Signs BP 104/88   Pulse 67   Temp 97.7 F (36.5 C) (Oral)   Resp 13   Ht 1.702 m (5' 7)   Wt 104.3 kg   SpO2 98%   BMI 36.01 kg/m   Physical Exam Vitals and nursing note reviewed.  Constitutional:      General: He is not in acute distress.    Appearance: He is well-developed.  HENT:     Head:  Normocephalic and atraumatic.  Eyes:     Conjunctiva/sclera: Conjunctivae normal.  Cardiovascular:     Rate and Rhythm: Normal rate and regular rhythm.  Pulmonary:     Effort: Pulmonary effort is normal. No respiratory distress.     Breath sounds: No stridor.  Abdominal:     General: There is no distension.  Skin:    General: Skin is warm and dry.  Neurological:     Mental Status: He is alert and oriented to person, place, and time.     (all labs ordered are listed, but only abnormal results are displayed) Labs Reviewed  COMPREHENSIVE METABOLIC PANEL WITH GFR - Abnormal; Notable for the following components:      Result Value   Glucose, Bld 118 (*)    BUN 25 (*)    Creatinine, Ser 1.33 (*)    GFR, Estimated 59 (*)    All other components within normal limits  CBC WITH DIFFERENTIAL/PLATELET  TROPONIN T, HIGH SENSITIVITY  TROPONIN T, HIGH SENSITIVITY    EKG: EKG Interpretation Date/Time:  Monday July 12 2024 14:28:26 EDT Ventricular Rate:  72 PR Interval:  224 QRS Duration:  180 QT Interval:  470 QTC Calculation: 514 R Axis:   169  Text Interpretation: Atrial-sensed ventricular-paced rhythm with prolonged AV conduction Abnormal ECG Confirmed by Garrick Charleston [phone]) on 07/12/2024 2:33:52 PM  Radiology: DG Chest 2 View Result Date: 07/12/2024 EXAM: 2 VIEW(S) XRAY OF THE CHEST 07/12/2024 02:34:00 PM COMPARISON: None available. CLINICAL HISTORY: chest pain. Pt arrived via POV c/o chest pain X 4 days. Pt reports pain is excruciating and he has been using his prescribed nitroglycerin  Spray w/o relief. Pt reports he has extensive cardiac history. Hx of asthma, afib, CAD, pacemaker, CHF, hypertension, CABG, ; pneumonia. Former smoker FINDINGS: LINES, TUBES AND DEVICES: Left chest dual-chamber pacemaker with leads projecting over right atrium and ventricle. Nondisplaced median sternotomy wires. LUNGS AND PLEURA: No focal pulmonary opacity. No pulmonary edema. No pleural  effusion. No pneumothorax. HEART AND MEDIASTINUM: Post CABG changes. BONES AND SOFT TISSUES: No acute osseous abnormality. IMPRESSION: 1. No acute cardiopulmonary findings. Electronically signed by: Waddell Calk MD 07/12/2024 03:36 PM EDT RP Workstation: [id number]     Procedures   Medications Ordered in the ED - No data to display                                  Medical Decision Making Adult male with multiple medical problems including CAD, hypertension, pacemaker, pneumonia, bipolar disorder presents with chest tightness.  Here patient is awake, alert, vitals, history, vitals  are initially reassuring, given the duration of symptoms without decompensation low suspicion for CAD, diabetes, pneumonia, or considerations. Cardiac 65 paced abnormal Pulse ox 100% room air normal  Amount and/or Complexity of Data Reviewed Independent Historian: spouse External Data Reviewed: notes. Labs: ordered. Decision-making details documented in ED Course. Radiology: ordered and independent interpretation performed. Decision-making details documented in ED Course. ECG/medicine tests: ordered and independent interpretation performed. Decision-making details documented in ED Course.  Risk Prescription drug management.   After first troponin values available patient was reassessed, he has negligible complaints, no chest pain, does have some neck discomfort which he has been dealing with for some time.  He notes that he typically uses marijuana for relief.  Patient offered Toradol .  5:36 PM Second opponent normal, though he does have a history of CAD, pacemaker in place, with no ongoing chest pain, nonischemic ECG, no change in troponins and duration of symptoms 4 days, low suspicion for atypical ACS, no evidence for pneumonia, bacteremia, sepsis.  Patient comfortable discharge, states he will follow-up with his primary care physician.     Final diagnoses:  Atypical chest pain    ED Discharge  Orders     None          Garrick Charleston, MD 07/12/24 1737

## 2024-07-12 NOTE — ED Triage Notes (Signed)
 Pt arrived via POV c/o chest pain X 4 days. Pt reports pain is excruciating and he has been using his prescribed nitroglycerin  Spray w/o relief. Pt reports he has extensive cardiac history.

## 2024-07-12 NOTE — ED Notes (Signed)
 Patient transported to X-ray

## 2024-07-28 ENCOUNTER — Inpatient Hospital Stay: Attending: Oncology

## 2024-07-28 ENCOUNTER — Ambulatory Visit (HOSPITAL_COMMUNITY)
Admission: RE | Admit: 2024-07-28 | Discharge: 2024-07-28 | Disposition: A | Discharge status: Home / Self Care | Source: Ambulatory Visit | Attending: Hematology | Admitting: Hematology

## 2024-07-28 DIAGNOSIS — M7989 Other specified soft tissue disorders: Secondary | ICD-10-CM | POA: Diagnosis not present

## 2024-07-28 DIAGNOSIS — R1900 Intra-abdominal and pelvic swelling, mass and lump, unspecified site: Secondary | ICD-10-CM | POA: Insufficient documentation

## 2024-07-28 DIAGNOSIS — Z87891 Personal history of nicotine dependence: Secondary | ICD-10-CM | POA: Insufficient documentation

## 2024-07-28 DIAGNOSIS — R1011 Right upper quadrant pain: Secondary | ICD-10-CM | POA: Diagnosis present

## 2024-07-28 LAB — COMPREHENSIVE METABOLIC PANEL WITH GFR
ALT: 27 U/L (ref 0–44)
AST: 26 U/L (ref 15–41)
Albumin: 5 g/dL (ref 3.5–5.0)
Alkaline Phosphatase: 59 U/L (ref 38–126)
Anion gap: 11 (ref 5–15)
BUN: 20 mg/dL (ref 8–23)
CO2: 29 mmol/L (ref 22–32)
Calcium: 9.8 mg/dL (ref 8.9–10.3)
Chloride: 98 mmol/L (ref 98–111)
Creatinine, Ser: 1.31 mg/dL — ABNORMAL HIGH (ref 0.61–1.24)
GFR, Estimated: >60 mL/min (ref >60)
Glucose, Bld: 129 mg/dL — ABNORMAL HIGH (ref 70–99)
Potassium: 4.7 mmol/L (ref 3.5–5.1)
Sodium: 138 mmol/L (ref 135–145)
Total Bilirubin: 0.7 mg/dL (ref 0.0–1.2)
Total Protein: 7.4 g/dL (ref 6.5–8.1)

## 2024-07-28 LAB — CBC WITH DIFFERENTIAL/PLATELET
Abs Immature Granulocytes: 0.04 K/uL (ref 0.00–0.07)
Basophils Absolute: 0.1 K/uL (ref 0.0–0.1)
Basophils Relative: 1 %
Eosinophils Absolute: 0 K/uL (ref 0.0–0.5)
Eosinophils Relative: 1 %
HCT: 44.9 % (ref 39.0–52.0)
Hemoglobin: 15.3 g/dL (ref 13.0–17.0)
Immature Granulocytes: 1 %
Lymphocytes Relative: 17 %
Lymphs Abs: 1.4 K/uL (ref 0.7–4.0)
MCH: 30.7 pg (ref 26.0–34.0)
MCHC: 34.1 g/dL (ref 30.0–36.0)
MCV: 90 fL (ref 80.0–100.0)
Monocytes Absolute: 0.5 K/uL (ref 0.1–1.0)
Monocytes Relative: 6 %
Neutro Abs: 6.2 K/uL (ref 1.7–7.7)
Neutrophils Relative %: 74 %
Platelets: 220 K/uL (ref 150–400)
RBC: 4.99 MIL/uL (ref 4.22–5.81)
RDW: 14.4 % (ref 11.5–15.5)
WBC: 8.2 K/uL (ref 4.0–10.5)
nRBC: 0 % (ref 0.0–0.2)

## 2024-07-28 LAB — LACTATE DEHYDROGENASE: LDH: 143 U/L (ref 105–235)

## 2024-07-28 MED ORDER — IOHEXOL 300 MG/ML  SOLN
100.0000 mL | Freq: Once | INTRAMUSCULAR | Status: AC | PRN
  Administered 2024-07-28: 100 mL via INTRAVENOUS

## 2024-07-30 ENCOUNTER — Other Ambulatory Visit: Payer: Self-pay | Admitting: Physician Assistant

## 2024-08-04 ENCOUNTER — Inpatient Hospital Stay

## 2024-08-04 ENCOUNTER — Inpatient Hospital Stay: Admitting: Oncology

## 2024-08-04 VITALS — BP 126/84 | HR 66 | Temp 98.1°F | Resp 18 | Ht 67.5 in | Wt 227.0 lb

## 2024-08-04 DIAGNOSIS — R1900 Intra-abdominal and pelvic swelling, mass and lump, unspecified site: Secondary | ICD-10-CM

## 2024-08-04 DIAGNOSIS — N189 Chronic kidney disease, unspecified: Secondary | ICD-10-CM

## 2024-08-04 DIAGNOSIS — D631 Anemia in chronic kidney disease: Secondary | ICD-10-CM | POA: Diagnosis not present

## 2024-08-04 DIAGNOSIS — N2889 Other specified disorders of kidney and ureter: Secondary | ICD-10-CM

## 2024-08-04 LAB — CBC WITH DIFFERENTIAL/PLATELET
Abs Immature Granulocytes: 0.02 K/uL (ref 0.00–0.07)
Basophils Absolute: 0.1 K/uL (ref 0.0–0.1)
Basophils Relative: 1 %
Eosinophils Absolute: 0 K/uL (ref 0.0–0.5)
Eosinophils Relative: 1 %
HCT: 42.6 % (ref 39.0–52.0)
Hemoglobin: 14.7 g/dL (ref 13.0–17.0)
Immature Granulocytes: 0 %
Lymphocytes Relative: 14 %
Lymphs Abs: 1.2 K/uL (ref 0.7–4.0)
MCH: 30.8 pg (ref 26.0–34.0)
MCHC: 34.5 g/dL (ref 30.0–36.0)
MCV: 89.1 fL (ref 80.0–100.0)
Monocytes Absolute: 0.4 K/uL (ref 0.1–1.0)
Monocytes Relative: 5 %
Neutro Abs: 6.6 K/uL (ref 1.7–7.7)
Neutrophils Relative %: 79 %
Platelets: 230 K/uL (ref 150–400)
RBC: 4.78 MIL/uL (ref 4.22–5.81)
RDW: 14.3 % (ref 11.5–15.5)
WBC: 8.3 K/uL (ref 4.0–10.5)
nRBC: 0 % (ref 0.0–0.2)

## 2024-08-04 LAB — C-REACTIVE PROTEIN: CRP: 0.5 mg/dL (ref <1.0)

## 2024-08-04 LAB — LACTATE DEHYDROGENASE: LDH: 170 U/L (ref 105–235)

## 2024-08-04 LAB — TECHNOLOGIST SMEAR REVIEW
Clinical Information: ABNORMAL
Plt Morphology: NORMAL

## 2024-08-04 LAB — SEDIMENTATION RATE: Sed Rate: 6 mm/h (ref 0–20)

## 2024-08-04 LAB — URIC ACID: Uric Acid, Serum: 5.6 mg/dL (ref 3.7–8.6)

## 2024-08-04 NOTE — Progress Notes (Signed)
 [facility] [address], [place] [address]   Clinic Day:  08/09/2024  Referring physician: Alben Therisa MATSU, PA  Patient Care Team: Alben Therisa MATSU, PA as PCP - General (Family Medicine) Waddell Danelle ORN, MD as PCP - Cardiology (Cardiology) Shaaron Lamar HERO, MD as Consulting Physician (Gastroenterology) Celestia Joesph SQUIBB, RN as Oncology Nurse Navigator (Medical Oncology)  ASSESSMENT & PLAN:   Assessment:  1.  Right perinephric soft tissue nodularity: - CTAP done for right-sided abdominal pain (05/29/2023): 2 areas of soft tissue nodularity within the right lower abdomen, measuring 2.3 x 1.8 x 1.9 cm send 1.5 x 1 x 1.3 cm.  These appear to most likely be within the anterior retroperitoneal fat likely just within the inferior aspect of the right perinephric space.  They were not present in 2021.  Stable splenomegaly with estimated volume of 1200 mL.  Stable mild steatosis of the liver without evidence of overt cirrhosis. - PET scan (06/26/2023): No hypermetabolic activity within the liver, pancreas, adrenal glands or spleen.  No hypermetabolic lymph nodes in the abdomen or pelvis.  1.9 cm soft tissue nodule in the lateral right perinephric space without appreciable hypermetabolic with SUV 1.8 beneath the blood pool.  It is not 14 mm perinephric soft tissue nodule non-FDG-avid.  Splenomegaly measuring 17.7 cm without focal hypermetabolism. - No prior history of abdominal surgery or pyelonephritis.  No B symptoms.  2.  Social/family history: - Lives at home with his wife.  He retired after working at [employer].  No chemical exposure.  Quit smoking cigarettes 38 years ago.  Smoked 1 and half pack per day for 17 years. - Mother had lung cancer.  Father had liver cancer.  Maternal uncle had testicular cancer.  Maternal aunt died of metastatic cancer.  Plan:  1.  Right perinephric soft tissue nodularity: - He does not report any B symptoms.  No abdominal pain reported.  He  continues to have on and off diarrhea. - Reviewed labs from 08/04/2024 which showed normal CBC and unremarkable uric acid, sedimentation rate and C-reactive protein.  LDH unremarkable. -CT scan from 07/28/2024 shows new 3.4 cm lesion arising from the right lower kidney suspicious for malignancy.  The appearance is unusual with differential considerations including renal cell carcinoma renal lymphoma or metastasis.  Additional perirenal nodularity gross unchanged the lymphoma is a consideration. -Discussed case with MD who recommends referral to urology and MRI of pelvis.  Will get this scheduled. -Will also get labs today including flow cytometry, uric acid and inflammatory markers. - Will see him back in 6 months with repeat labs and scan.   Orders Placed This Encounter  Procedures   MR PELVIS W WO CONTRAST    Standing Status:   Future    Expected Date:   08/11/2024    Expiration Date:   08/04/2025    If indicated for the ordered procedure, I authorize the administration of contrast media per Radiology protocol:   Yes    What is the patient's sedation requirement?:   No Sedation    Does the patient have a pacemaker or implanted devices?:   No    Preferred imaging location?:   [facility] (table limit - 500lbs)   Lactate dehydrogenase    Standing Status:   Future    Expected Date:   08/04/2024    Expiration Date:   11/02/2024   Lactate dehydrogenase    Standing Status:   Future    Number of  Occurrences:   1    Expected Date:   08/04/2024    Expiration Date:   11/02/2024   Technologist smear review    Standing Status:   Future    Number of Occurrences:   1    Expected Date:   08/04/2024    Expiration Date:   11/02/2024    Clinical information::   Abnormal Ct scan   CBC with Differential/Platelet    Standing Status:   Future    Number of Occurrences:   1    Expected Date:   08/04/2024    Expiration Date:   11/02/2024   Flow Cytometry, Peripheral Blood (Oncology)    Standing  Status:   Future    Number of Occurrences:   1    Expected Date:   08/04/2024    Expiration Date:   11/02/2024   Sedimentation rate    Standing Status:   Future    Number of Occurrences:   1    Expected Date:   08/04/2024    Expiration Date:   11/02/2024   C-reactive protein    Standing Status:   Future    Number of Occurrences:   1    Expected Date:   08/04/2024    Expiration Date:   11/02/2024   Uric acid    Standing Status:   Future    Number of Occurrences:   1    Expected Date:   08/04/2024    Expiration Date:   11/02/2024   Ambulatory referral to Urology    Referral Priority:   Routine    Referral Type:   Consultation    Referral Reason:   Specialty Services Required    Referred to Provider:   Sherrilee Belvie CROME, MD    Requested Specialty:   Urology    Number of Visits Requested:   1   Total time spent is 30 minutes.   [name] FORBES [name], NP   11/24/20259:38 AM  CHIEF COMPLAINT/PURPOSE OF CONSULT:   Diagnosis: soft tissue nodules in the right perinephric space   Cancer Staging  No matching staging information was found for the patient.    Prior Therapy: none  Current Therapy: Observation   HISTORY OF PRESENT ILLNESS:   Oncology History   No history exists.      [name] [name] is a 68 y.o. male presenting to clinic today for evaluation of soft tissue disorder.   He was initially referred to GI back in 10/2022 with concerns for nausea and dry heaving with intermittent RUQ abdominal pain. He was scheduled for EGD and colonoscopy in 01/2023, but he was also scheduled for left heart cath and angiography around the same time. He was seen back for follow up in 05/2023 and underwent CT A/P on 05/29/23 showing: two areas of soft tissue nodularity within RLQ abdominal fat, measuring 2.3 cm and 1.5 cm, appearing to most likely be within anterior retroperitoneal fat likely just within inferior aspect of right perinephric space; stable splenomegaly, mild hepatic steatosis, and  cholelithiasis.   These findings prompted a staging PET scan on 06/26/23 showing: no findings suspicious for lymphoma; small right perinephric nodules without appreciable hypermetabolism; splenomegaly without focal hypermetabolism.   INTERVAL HISTORY:   JAI STEIL is a 68 y.o. male presenting to the clinic today for follow-up of right perinephric soft tissue nodularity.   Patient has been followed by us  every 6 months with labs and CT scan.  He is here to review his most recent CT  scan from 07/28/2024.  Since his last visit, he was evaluated in the ED for chest pain on 07/12/2024.  Workup was essentially unremarkable.  Today, he states that he is doing well overall. His appetite level is at 100%. His energy level is low.  Reports intermittent chest pain, constipation, diarrhea, nausea/vomiting, dizziness, headaches, numbness and burning and sleep problems.  He has pain in his abdomen and neck x 3 weeks.  He is here today with his wife. Since his last visit, he had CT AP on 01/20/24 that found: No acute process is identified within the abdomen or pelvis. Stable appearance of the nodular densities within the fat of the anterior right retroperitoneum separate from and inferior to the right lower pole.   PAST MEDICAL HISTORY:   Past Medical History: Past Medical History:  Diagnosis Date   ALLERGIC RHINITIS 05/14/2010   Qualifier: Diagnosis of  By: Windell PA, Dawn     Asthma    Asthma    Atrial fibrillation [facility])    new November 2016   Bipolar 1 disorder (HCC)    put me on this after I got out of drug rehab 11/18/2005 and couldn't sleep; added anxiety RX and have been sleeping fine since   Bradycardia    Very limited Wenkebach on event recorder November 2010   CAD (coronary artery disease) 2006   3v CAD >> s/p CABG in [place], patent LIMA to LAD and SVG to diagonal with 40% stenosis 2010 // Nuc 2/14: low risk, EF 56 // LHC 3/16: patent L-LAD, patent S-Dx // Echo 3/16: mild conc  LVH, EF 60-65, Gr 1 DD, trivial TR, mild LAE // LHC 12/19: oLAD 100, pLCx 20, S-D1 patent, L-LAD patent, EF 45   Cardiac pacemaker in situ 08/18/2018   CHB (complete heart block) (HCC) 12/02/2014   Chest pain with moderate risk of acute coronary syndrome    Chronic systolic CHF (congestive heart failure) (HCC) 09/15/2018   EF 45 by LHC in 12/19 // Echo 09/2018: inf HK, EF 45-50, mod LAE   Dyslipidemia    mixed    ED (erectile dysfunction)    Ejection fraction    EF 50%, catheterization, September, 2010   //   EF 60-65%, echo, December 03, 2014    Essential hypertension    GERD (gastroesophageal reflux disease)    Gout    History of stomach ulcers 1980's   Hx of CABG    2006, [place]    Memory loss-SVD on MRI March 2015 11/18/2013   Mixed hyperlipidemia    pt states they took me off q med they had me on for high cholesterol; my good cholesterol is low (09/13/2015)   Myocardial infarction (HCC) 2006   OSA (obstructive sleep apnea)    patient denies, stated had a sleep study and he was told he did not have     PAF (paroxysmal atrial fibrillation) (HCC) 08/18/2018   Palpitations    Event recorder November, 2010, no significant arrhythmias    Pneumonia 2015   Pneumonia 1990's X 4 in one year   double pneumonia   Presence of permanent cardiac pacemaker    Second degree AV block 09/13/2015   Second degree AV block, Mobitz type I 03/10/2013   Patient had Mobitz 1 heart block that was asymptomatic. This resolved off beta blockade while in the hospital March, 2016. Plan to keep the patient off beta blockers. Patient will be seen in follow-up by Dr. Waddell and the [place] office.  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[facility])     Surgical History: Past Surgical History:  Procedure Laterality Date   BACK SURGERY     BIOPSY  07/28/2023   Procedure: BIOPSY;  Surgeon: Shaaron Lamar HERO, MD;  Location: [facility] ENDO [facility];  Service: Endoscopy;;   CARDIAC CATHETERIZATION     I've had quite a few; don't  remember any stents (09/13/2015)   COLONOSCOPY WITH PROPOFOL  N/A 07/28/2023   Procedure: COLONOSCOPY WITH PROPOFOL ;  Surgeon: Shaaron Lamar HERO, MD;  Location: [facility];  Service: Endoscopy;  Laterality: N/A;  1:15 pm, asa 3   CORONARY ANGIOPLASTY     CORONARY ARTERY BYPASS GRAFT  2006   CABG X2, in [place]   EP IMPLANTABLE DEVICE N/A 09/13/2015   Procedure: Pacemaker Implant;  Surgeon: Lynwood Rakers, MD;  Location: [facility] INVASIVE CV LAB;  Service: Cardiovascular;  Laterality: N/A;   ESOPHAGOGASTRODUODENOSCOPY (EGD) WITH PROPOFOL  N/A 07/28/2023   Procedure: ESOPHAGOGASTRODUODENOSCOPY (EGD) WITH PROPOFOL ;  Surgeon: Shaaron Lamar HERO, MD;  Location: [facility];  Service: Endoscopy;  Laterality: N/A;   INSERT / REPLACE / REMOVE PACEMAKER  09/13/2015   [employer]   LEFT HEART CATH AND CORS/GRAFTS ANGIOGRAPHY N/A 08/26/2018   Procedure: LEFT HEART CATH AND CORS/GRAFTS ANGIOGRAPHY;  Surgeon: Burnard Debby LABOR, MD;  Location: [facility] INVASIVE CV LAB;  Service: Cardiovascular;  Laterality: N/A;   LEFT HEART CATH AND CORS/GRAFTS ANGIOGRAPHY N/A 01/14/2023   Procedure: LEFT HEART CATH AND CORS/GRAFTS ANGIOGRAPHY;  Surgeon: Dann Candyce RAMAN, MD;  Location: [facility] INVASIVE CV LAB;  Service: Cardiovascular;  Laterality: N/A;   LEFT HEART CATHETERIZATION WITH CORONARY/GRAFT ANGIOGRAM N/A 12/05/2014   Procedure: LEFT HEART CATHETERIZATION WITH EL BILE;  Surgeon: Peter M Jordan, MD;  Location: [facility] CATH LAB;  Service: Cardiovascular;  Laterality: N/A;   LEG SURGERY Left 1961   had 5 boils come up on my leg; they had to cut them out and put a drainage tube in there   LUMBAR DISC SURGERY  X 3   ruptured disc   MAXIMUM ACCESS (MAS)POSTERIOR LUMBAR INTERBODY FUSION (PLIF) 1 LEVEL  ~ 2003   L4-5; put rods & screws in    Social History: Social History   Socioeconomic History   Marital status: Married    Spouse name: Not on file   Number of children: Not on file   Years of education: Not on  file   Highest education level: Not on file  Occupational History   Not on file  Tobacco Use   Smoking status: Former    Current packs/day: 0.00    Average packs/day: 2.0 packs/day for 17.0 years (34.0 ttl pk-yrs)    Types: Cigarettes    Start date: 03/13/2000    Quit date: 03/13/2017    Years since quitting: 7.4   Smokeless tobacco: Current    Types: Snuff   Tobacco comments:    09/13/2015 quit chewing tobacco August 2016  Vaping Use   Vaping status: Never Used  Substance and Sexual Activity   Alcohol use: Not Currently    Comment: 09/13/2015 got out of alcohol rehab 11/18/2005   Drug use: Not Currently    Comment: 09/13/2015 got out of rehab for pills 11/18/2005   Sexual activity: Yes    Birth control/protection: None  Other Topics Concern   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    Social Drivers of Corporate Investment Banker Strain: Not on file  Food Insecurity: No Food Insecurity (  07/28/2023)   Hunger Vital Sign    Worried About Running Out of Food in the Last Year: Never true    Ran Out of Food in the Last Year: Never true  Transportation Needs: No Transportation Needs (07/28/2023)   PRAPARE - Administrator, Civil Service (Medical): No    Lack of Transportation (Non-Medical): No  Physical Activity: Not on file  Stress: Not on file  Social Connections: Not on file  Intimate Partner Violence: Not At Risk (07/28/2023)   Humiliation, Afraid, Rape, and Kick questionnaire    Fear of Current or Ex-Partner: No    Emotionally Abused: No    Physically Abused: No    Sexually Abused: No    Family History: Family History  Problem Relation Age of Onset   COPD Mother    Lung cancer Mother    Stroke Mother    Heart attack Mother    Heart disease Father    Heart attack Father    Hyperlipidemia Brother    Hypertension Brother    Hypertension Brother    Hypertension Sister     Current  Medications:  Current Outpatient Medications:    allopurinol  (ZYLOPRIM ) 300 MG tablet, Take 300 mg by mouth every evening., Disp: , Rfl:    amLODipine  (NORVASC ) 2.5 MG tablet, Take 1 tablet (2.5 mg total) by mouth at bedtime., Disp: 90 tablet, Rfl: 3   colchicine  0.6 MG tablet, Take 0.6 mg by mouth daily as needed (Gout)., Disp: , Rfl: 0   cyclobenzaprine  (FLEXERIL ) 5 MG tablet, Take 5-10 mg by mouth 2 (two) times daily as needed., Disp: , Rfl:    fluticasone (FLONASE) 50 MCG/ACT nasal spray, Place 2 sprays into both nostrils daily., Disp: , Rfl:    isosorbide  mononitrate (IMDUR ) 30 MG 24 hr tablet, Take 0.5 tablets (15 mg total) by mouth daily., Disp: 45 tablet, Rfl: 3   loratadine  (CLARITIN ) 10 MG tablet, Take 10 mg by mouth daily., Disp: , Rfl:    Magnesium 300 MG CAPS, Take 300 mg by mouth daily., Disp: , Rfl:    Melatonin 10 MG SUBL, 1 tablet under the tongue and allow to dissolve at bedtime as needed Sublingual at bedtime, Disp: , Rfl:    metoprolol  succinate (TOPROL -XL) 25 MG 24 hr tablet, TAKE 1 TABLET BY MOUTH EVERY DAY. MAY TAKE 1/2 TO 1 TABLET EXTRA FOR PALITATTIONS, Disp: 60 tablet, Rfl: 11   nitroGLYCERIN  (NITROLINGUAL ) 0.4 MG/SPRAY spray, USE 1 SPRAY UNDER THE TONGUE EVERY 5 MINUTES AS NEEDED FOR CHEST PAIN, Disp: 12 g, Rfl: 3   Omega-3 Fatty Acids (FISH OIL PO), Take 900 mg by mouth daily., Disp: , Rfl:    omeprazole  (PRILOSEC) 40 MG capsule, Take 1 capsule (40 mg total) by mouth daily., Disp: 90 capsule, Rfl: 3   ondansetron  (ZOFRAN ) 4 MG tablet, Take 1 tablet (4 mg total) by mouth every 8 (eight) hours as needed for nausea or vomiting., Disp: 30 tablet, Rfl: 1   POTASSIUM PO, Take 1 tablet by mouth daily., Disp: , Rfl:    QUEtiapine  (SEROQUEL ) 300 MG tablet, Take 300 mg by mouth at bedtime., Disp: , Rfl:    rivaroxaban  (XARELTO ) 20 MG TABS tablet, Take 1 tablet (20 mg total) by mouth daily with supper., Disp: 30 tablet, Rfl:    rosuvastatin  (CRESTOR ) 10 MG tablet, TAKE 1 TABLET  BY MOUTH EVERY DAY, Disp: 30 tablet, Rfl: 0   silodosin  (RAPAFLO ) 8 MG CAPS capsule, Take 1 capsule (8 mg total) by  mouth at bedtime., Disp: 90 capsule, Rfl: 3   temazepam (RESTORIL) 30 MG capsule, Take 30 mg by mouth at bedtime. , Disp: , Rfl: 5   valsartan -hydrochlorothiazide  (DIOVAN  HCT) 320-12.5 MG tablet, Take 1 tablet by mouth daily., Disp: 90 tablet, Rfl: 3   Allergies: Allergies  Allergen Reactions   Sulfonamide Derivatives Hives   Zolpidem  Hives   Zolpidem  Tartrate Other (See Comments)    REVIEW OF SYSTEMS:   Review of Systems  Constitutional:  Positive for fatigue.  Cardiovascular:  Positive for chest pain.  Gastrointestinal:  Positive for diarrhea, nausea and vomiting.  Neurological:  Positive for dizziness, headaches and numbness.  Psychiatric/Behavioral:  Positive for sleep disturbance.      VITALS:   Blood pressure 126/84, pulse 66, temperature 98.1 F (36.7 C), temperature source Tympanic, resp. rate 18, height 5' 7.5 (1.715 m), weight 227 lb (103 kg), SpO2 98%.  Wt Readings from Last 3 Encounters:  08/04/24 227 lb (103 kg)  07/12/24 229 lb 15 oz (104.3 kg)  11/23/23 230 lb (104.3 kg)    Body mass index is 35.03 kg/m.  Performance status (ECOG): 1 - Symptomatic but completely ambulatory  PHYSICAL EXAM:   Physical Exam Constitutional:      Appearance: Normal appearance.  Cardiovascular:     Rate and Rhythm: Normal rate and regular rhythm.  Pulmonary:     Effort: Pulmonary effort is normal.     Breath sounds: Normal breath sounds.  Abdominal:     General: Bowel sounds are normal.     Palpations: Abdomen is soft.  Musculoskeletal:        General: No swelling. Normal range of motion.  Neurological:     Mental Status: He is alert and oriented to person, place, and time. Mental status is at baseline.      LABS:      Latest Ref Rng & Units 08/04/2024    2:42 PM 07/28/2024   12:28 PM 07/12/2024    2:36 PM  CBC  WBC 4.0 - 10.5 K/uL 8.3  8.2  8.7    Hemoglobin 13.0 - 17.0 g/dL 85.2  84.6  84.8   Hematocrit 39.0 - 52.0 % 42.6  44.9  44.7   Platelets 150 - 400 K/uL 230  220  225       Latest Ref Rng & Units 07/28/2024   12:28 PM 07/12/2024    2:36 PM 01/20/2024   12:26 PM  CMP  Glucose 70 - 99 mg/dL 870  881  873   BUN 8 - 23 mg/dL 20  25  15    Creatinine 0.61 - 1.24 mg/dL 8.68  8.66  8.72   Sodium 135 - 145 mmol/L 138  138  137   Potassium 3.5 - 5.1 mmol/L 4.7  4.6  4.3   Chloride 98 - 111 mmol/L 98  100  101   CO2 22 - 32 mmol/L 29  27  27    Calcium  8.9 - 10.3 mg/dL 9.8  9.7  9.4   Total Protein 6.5 - 8.1 g/dL 7.4  7.2  7.0   Total Bilirubin 0.0 - 1.2 mg/dL 0.7  0.8  0.8   Alkaline Phos 38 - 126 U/L 59  61  48   AST 15 - 41 U/L 26  20  24    ALT 0 - 44 U/L 27  22  32      No results found for: CEA1, CEA / No results found for: CEA1, CEA No results found for:  PSA1 No results found for: CAN199 No results found for: CAN125  No results found for: STEPHANY RINGS, A1GS, A2GS, BETS, BETA2SER, GAMS, MSPIKE, SPEI Lab Results  Component Value Date   TIBC 304 11/13/2022   FERRITIN 296 11/13/2022   IRONPCTSAT 36 11/13/2022   Lab Results  Component Value Date   LDH 170 08/04/2024   LDH 143 07/28/2024   LDH 121 01/20/2024     STUDIES:

## 2024-08-09 NOTE — Addendum Note (Signed)
 Addended by: Tennyson Wacha on: 08/09/2024 09:55 PM   Modules accepted: Level of Service

## 2024-08-17 ENCOUNTER — Other Ambulatory Visit: Payer: Self-pay | Admitting: Oncology

## 2024-08-17 DIAGNOSIS — N2889 Other specified disorders of kidney and ureter: Secondary | ICD-10-CM

## 2024-08-17 NOTE — Progress Notes (Signed)
 Order changed from MRI to CT renal stone study as he has metal in his body.  He has previously seen Dr. Sherrilee and a referral was sent over for this new renal mass.  Will see if we can get this moved up.  Will see him back in about 6 weeks to see what Dr. Little recommendations are.  Delon Hope, AGNP-C Department of Hematology/Oncology [facility]  Phone: [phone]  08/17/2024 2:54 PM

## 2024-08-17 NOTE — Progress Notes (Signed)
 CT renal abdomen with and without contrast.  Discontinued CT renal stone study.  Delon Hope, AGNP-C Department of Hematology/Oncology [facility]  Phone: [phone]  08/17/2024 2:57 PM

## 2024-08-24 LAB — SURGICAL PATHOLOGY

## 2024-08-27 LAB — FLOW CYTOMETRY

## 2024-08-31 ENCOUNTER — Ambulatory Visit (HOSPITAL_COMMUNITY)

## 2024-09-07 ENCOUNTER — Other Ambulatory Visit (HOSPITAL_COMMUNITY)

## 2024-09-13 ENCOUNTER — Encounter: Payer: Self-pay | Admitting: *Deleted

## 2024-09-20 ENCOUNTER — Ambulatory Visit: Payer: BC Managed Care – PPO

## 2024-09-20 DIAGNOSIS — I480 Paroxysmal atrial fibrillation: Secondary | ICD-10-CM

## 2024-09-21 ENCOUNTER — Inpatient Hospital Stay: Admitting: Oncology

## 2024-09-22 ENCOUNTER — Ambulatory Visit: Payer: Self-pay | Admitting: Student in an Organized Health Care Education/Training Program

## 2024-09-22 LAB — CUP PACEART REMOTE DEVICE CHECK
Battery Remaining Longevity: 6 mo
Battery Voltage: 2.86 V
Brady Statistic AP VP Percent: 19.47 %
Brady Statistic AP VS Percent: 0.01 %
Brady Statistic AS VP Percent: 80.43 %
Brady Statistic AS VS Percent: 0.09 %
Brady Statistic RA Percent Paced: 19.03 %
Brady Statistic RV Percent Paced: 99.89 %
Date Time Interrogation Session: 20260105145244
Implantable Lead Connection Status: 753985
Implantable Lead Connection Status: 753985
Implantable Lead Implant Date: 20161228
Implantable Lead Implant Date: 20161228
Implantable Lead Location: 753859
Implantable Lead Location: 753860
Implantable Lead Model: 5076
Implantable Lead Model: 5076
Implantable Pulse Generator Implant Date: 20161228
Lead Channel Impedance Value: 342 Ohm
Lead Channel Impedance Value: 380 Ohm
Lead Channel Impedance Value: 418 Ohm
Lead Channel Impedance Value: 475 Ohm
Lead Channel Pacing Threshold Amplitude: 0.875 V
Lead Channel Pacing Threshold Amplitude: 1.125 V
Lead Channel Pacing Threshold Pulse Width: 0.4 ms
Lead Channel Pacing Threshold Pulse Width: 0.4 ms
Lead Channel Sensing Intrinsic Amplitude: 1.75 mV
Lead Channel Sensing Intrinsic Amplitude: 1.75 mV
Lead Channel Sensing Intrinsic Amplitude: 9 mV
Lead Channel Sensing Intrinsic Amplitude: 9 mV
Lead Channel Setting Pacing Amplitude: 2.5 V
Lead Channel Setting Pacing Amplitude: 2.5 V
Lead Channel Setting Pacing Pulse Width: 0.4 ms
Lead Channel Setting Sensing Sensitivity: 2.8 mV
Zone Setting Status: 755011
Zone Setting Status: 755011

## 2024-09-23 NOTE — Progress Notes (Signed)
 Remote PPM Transmission

## 2024-10-15 ENCOUNTER — Ambulatory Visit: Admitting: Urology

## 2024-10-15 ENCOUNTER — Telehealth (HOSPITAL_BASED_OUTPATIENT_CLINIC_OR_DEPARTMENT_OTHER): Payer: Self-pay | Admitting: *Deleted

## 2024-10-15 VITALS — BP 117/72 | HR 72

## 2024-10-15 DIAGNOSIS — N2889 Other specified disorders of kidney and ureter: Secondary | ICD-10-CM

## 2024-10-15 LAB — URINALYSIS, ROUTINE W REFLEX MICROSCOPIC
Bilirubin, UA: NEGATIVE
Glucose, UA: NEGATIVE
Ketones, UA: NEGATIVE
Leukocytes,UA: NEGATIVE
Nitrite, UA: NEGATIVE
Protein,UA: NEGATIVE
RBC, UA: NEGATIVE
Specific Gravity, UA: 1.015 (ref 1.005–1.030)
Urobilinogen, Ur: 0.2 mg/dL (ref 0.2–1.0)
pH, UA: 6 (ref 5.0–7.5)

## 2024-10-15 NOTE — Telephone Encounter (Signed)
 Patient with diagnosis of atrial fibrillation on Xarelto  for anticoagulation.    Procedure:  CT US  GUIDED Bx    Date of Surgery:  Clearance TBD     CHA2DS2-VASc Score = 4   This indicates a 4.8% annual risk of stroke. The patient's score is based upon: CHF History: 1 HTN History: 1 Diabetes History: 0 Stroke History: 0 Vascular Disease History: 1 Age Score: 1 Gender Score: 0   CrCl 81 Platelet count 230  Patient has not had an Afib/aflutter ablation in the last 3 months, DCCV within the last 4 weeks or a watchman implanted in the last 45 days   Per office protocol, patient can hold Xarelto  for 2 days prior to procedure.   Patient will not need bridging with Lovenox  (enoxaparin ) around procedure.  **This guidance is not considered finalized until pre-operative APP has relayed final recommendations.**

## 2024-10-15 NOTE — Progress Notes (Unsigned)
 "  10/15/2024 11:31 AM   [name] [name] 04-Jun-1956 [id number]  Referring provider: Alben Therisa MATSU, [place] [address] [place],  [place] [address]  No chief complaint on file.   HPI:    PMH: Past Medical History:  Diagnosis Date   ALLERGIC RHINITIS 05/14/2010   Qualifier: Diagnosis of  By: Windell PA, Dawn     Asthma    Asthma    Atrial fibrillation [facility])    new November 2016   Bipolar 1 disorder (HCC)    put me on this after I got out of drug rehab 11/18/2005 and couldn't sleep; added anxiety RX and have been sleeping fine since   Bradycardia    Very limited Wenkebach on event recorder November 2010   CAD (coronary artery disease) 2006   3v CAD >> s/p CABG in [place], patent LIMA to LAD and SVG to diagonal with 40% stenosis 2010 // Nuc 2/14: low risk, EF 56 // LHC 3/16: patent L-LAD, patent S-Dx // Echo 3/16: mild conc LVH, EF 60-65, Gr 1 DD, trivial TR, mild LAE // LHC 12/19: oLAD 100, pLCx 20, S-D1 patent, L-LAD patent, EF 45   Cardiac pacemaker in situ 08/18/2018   CHB (complete heart block) (HCC) 12/02/2014   Chest pain with moderate risk of acute coronary syndrome    Chronic systolic CHF (congestive heart failure) (HCC) 09/15/2018   EF 45 by LHC in 12/19 // Echo 09/2018: inf HK, EF 45-50, mod LAE   Dyslipidemia    mixed    ED (erectile dysfunction)    Ejection fraction    EF 50%, catheterization, September, 2010   //   EF 60-65%, echo, December 03, 2014    Essential hypertension    GERD (gastroesophageal reflux disease)    Gout    History of stomach ulcers 1980's   Hx of CABG    2006, [place]    Memory loss-SVD on MRI March 2015 11/18/2013   Mixed hyperlipidemia    pt states they took me off q med they had me on for high cholesterol; my good cholesterol is low (09/13/2015)   Myocardial infarction (HCC) 2006   OSA (obstructive sleep apnea)    patient denies, stated had a sleep study and he was told he did not have     PAF (paroxysmal atrial  fibrillation) (HCC) 08/18/2018   Palpitations    Event recorder November, 2010, no significant arrhythmias    Pneumonia 2015   Pneumonia 1990's X 4 in one year   double pneumonia   Presence of permanent cardiac pacemaker    Second degree AV block 09/13/2015   Second degree AV block, Mobitz type I 03/10/2013   Patient had Mobitz 1 heart block that was asymptomatic. This resolved off beta blockade while in the hospital March, 2016. Plan to keep the patient off beta blockers. Patient will be seen in follow-up by Dr. Waddell and the [place] office.    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[facility])     Surgical History: Past Surgical History:  Procedure Laterality Date   BACK SURGERY     BIOPSY  07/28/2023   Procedure: BIOPSY;  Surgeon: Shaaron Lamar HERO, MD;  Location: [facility];  Service: Endoscopy;;   CARDIAC CATHETERIZATION     I've had quite a few; don't remember any stents (09/13/2015)   COLONOSCOPY WITH PROPOFOL  N/A 07/28/2023   Procedure: COLONOSCOPY WITH PROPOFOL ;  Surgeon: Shaaron Lamar HERO, MD;  Location: [facility];  Service: Endoscopy;  Laterality: N/A;  1:15 pm, asa 3   CORONARY ANGIOPLASTY     CORONARY ARTERY BYPASS GRAFT  2006   CABG X2, in [place]   EP IMPLANTABLE DEVICE N/A 09/13/2015   Procedure: Pacemaker Implant;  Surgeon: Lynwood Rakers, MD;  Location: [facility] INVASIVE CV LAB;  Service: Cardiovascular;  Laterality: N/A;   ESOPHAGOGASTRODUODENOSCOPY (EGD) WITH PROPOFOL  N/A 07/28/2023   Procedure: ESOPHAGOGASTRODUODENOSCOPY (EGD) WITH PROPOFOL ;  Surgeon: Shaaron Lamar HERO, MD;  Location: [facility];  Service: Endoscopy;  Laterality: N/A;   INSERT / REPLACE / REMOVE PACEMAKER  09/13/2015   [employer]   LEFT HEART CATH AND CORS/GRAFTS ANGIOGRAPHY N/A 08/26/2018   Procedure: LEFT HEART CATH AND CORS/GRAFTS ANGIOGRAPHY;  Surgeon: Burnard Debby LABOR, MD;  Location: [facility] INVASIVE CV LAB;  Service: Cardiovascular;  Laterality: N/A;   LEFT HEART CATH AND CORS/GRAFTS ANGIOGRAPHY N/A  01/14/2023   Procedure: LEFT HEART CATH AND CORS/GRAFTS ANGIOGRAPHY;  Surgeon: Dann Candyce RAMAN, MD;  Location: [facility] INVASIVE CV LAB;  Service: Cardiovascular;  Laterality: N/A;   LEFT HEART CATHETERIZATION WITH CORONARY/GRAFT ANGIOGRAM N/A 12/05/2014   Procedure: LEFT HEART CATHETERIZATION WITH EL BILE;  Surgeon: Peter M Jordan, MD;  Location: [facility] CATH LAB;  Service: Cardiovascular;  Laterality: N/A;   LEG SURGERY Left 1961   had 5 boils come up on my leg; they had to cut them out and put a drainage tube in there   LUMBAR DISC SURGERY  X 3   ruptured disc   MAXIMUM ACCESS (MAS)POSTERIOR LUMBAR INTERBODY FUSION (PLIF) 1 LEVEL  ~ 2003   L4-5; put rods & screws in    Home Medications:  Allergies as of 10/15/2024       Reactions   Sulfonamide Derivatives Hives   Zolpidem  Hives   Zolpidem  Tartrate Other (See Comments)        Medication List        Accurate as of October 15, 2024 11:31 AM. If you have any questions, ask your nurse or doctor.          allopurinol  300 MG tablet Commonly known as: ZYLOPRIM  Take 300 mg by mouth every evening.   amLODipine  2.5 MG tablet Commonly known as: NORVASC  Take 1 tablet (2.5 mg total) by mouth at bedtime.   colchicine  0.6 MG tablet Take 0.6 mg by mouth daily as needed (Gout).   cyclobenzaprine  5 MG tablet Commonly known as: FLEXERIL  Take 5-10 mg by mouth 2 (two) times daily as needed.   FISH OIL PO Take 900 mg by mouth daily.   fluticasone 50 MCG/ACT nasal spray Commonly known as: FLONASE Place 2 sprays into both nostrils daily.   isosorbide  mononitrate 30 MG 24 hr tablet Commonly known as: IMDUR  Take 0.5 tablets (15 mg total) by mouth daily.   loratadine  10 MG tablet Commonly known as: CLARITIN  Take 10 mg by mouth daily.   Magnesium 300 MG Caps Take 300 mg by mouth daily.   Melatonin 10 MG Subl 1 tablet under the tongue and allow to dissolve at bedtime as needed Sublingual at bedtime   metoprolol   succinate 25 MG 24 hr tablet Commonly known as: TOPROL -XL TAKE 1 TABLET BY MOUTH EVERY DAY. MAY TAKE 1/2 TO 1 TABLET EXTRA FOR PALITATTIONS   nitroGLYCERIN  0.4 MG/SPRAY spray Commonly known as: NITROLINGUAL  USE 1 SPRAY UNDER THE TONGUE EVERY 5 MINUTES AS NEEDED FOR CHEST PAIN   omeprazole  40 MG capsule Commonly known as: PRILOSEC Take 1 capsule (40 mg total) by mouth daily.   ondansetron  4 MG tablet Commonly  known as: ZOFRAN  Take 1 tablet (4 mg total) by mouth every 8 (eight) hours as needed for nausea or vomiting.   POTASSIUM PO Take 1 tablet by mouth daily.   QUEtiapine  300 MG tablet Commonly known as: SEROQUEL  Take 300 mg by mouth at bedtime.   rivaroxaban  20 MG Tabs tablet Commonly known as: XARELTO  Take 1 tablet (20 mg total) by mouth daily with supper.   rosuvastatin  10 MG tablet Commonly known as: CRESTOR  TAKE 1 TABLET BY MOUTH EVERY DAY   silodosin  8 MG Caps capsule Commonly known as: RAPAFLO  Take 1 capsule (8 mg total) by mouth at bedtime.   temazepam 30 MG capsule Commonly known as: RESTORIL Take 30 mg by mouth at bedtime.   valsartan -hydrochlorothiazide  320-12.5 MG tablet Commonly known as: Diovan  HCT Take 1 tablet by mouth daily.        Allergies: Allergies[1]  Family History: Family History  Problem Relation Age of Onset   COPD Mother    Lung cancer Mother    Stroke Mother    Heart attack Mother    Heart disease Father    Heart attack Father    Hyperlipidemia Brother    Hypertension Brother    Hypertension Brother    Hypertension Sister     Social History:  reports that he quit smoking about 7 years ago. His smoking use included cigarettes. He started smoking about 24 years ago. He has a 34 pack-year smoking history. His smokeless tobacco use includes snuff. He reports that he does not currently use alcohol. He reports that he does not currently use drugs.  ROS: All other review of systems were reviewed and are negative except what is  noted above in HPI  Physical Exam: BP 117/72   Pulse 72   Constitutional:  Alert and oriented, No acute distress. HEENT: [place] AT, moist mucus membranes.  Trachea midline, no masses. Cardiovascular: No clubbing, cyanosis, or edema. Respiratory: Normal respiratory effort, no increased work of breathing. GI: Abdomen is soft, nontender, nondistended, no abdominal masses GU: No CVA tenderness.  Lymph: No cervical or inguinal lymphadenopathy. Skin: No rashes, bruises or suspicious lesions. Neurologic: Grossly intact, no focal deficits, moving all 4 extremities. Psychiatric: Normal mood and affect.  Laboratory Data: Lab Results  Component Value Date   WBC 8.3 08/04/2024   HGB 14.7 08/04/2024   HCT 42.6 08/04/2024   MCV 89.1 08/04/2024   PLT 230 08/04/2024    Lab Results  Component Value Date   CREATININE 1.31 (H) 07/28/2024    No results found for: PSA  No results found for: TESTOSTERONE  Lab Results  Component Value Date   HGBA1C 5.3 12/02/2014    Urinalysis    Component Value Date/Time   COLORURINE YELLOW 11/23/2023 1539   APPEARANCEUR Clear 02/18/2024 1341   LABSPEC 1.012 11/23/2023 1539   PHURINE 6.0 11/23/2023 1539   GLUCOSEU Negative 02/18/2024 1341   HGBUR NEGATIVE 11/23/2023 1539   BILIRUBINUR Negative 02/18/2024 1341   KETONESUR NEGATIVE 11/23/2023 1539   PROTEINUR Negative 02/18/2024 1341   PROTEINUR NEGATIVE 11/23/2023 1539   NITRITE Negative 02/18/2024 1341   NITRITE NEGATIVE 11/23/2023 1539   LEUKOCYTESUR Negative 02/18/2024 1341   LEUKOCYTESUR NEGATIVE 11/23/2023 1539    Lab Results  Component Value Date   LABMICR Comment 02/18/2024   BACTERIA NONE SEEN 11/23/2023    Pertinent Imaging: *** No results found for this or any previous visit.  No results found for this or any previous visit.  No results found for  this or any previous visit.  No results found for this or any previous visit.  No results found for this or any previous  visit.  No results found for this or any previous visit.  No results found for this or any previous visit.  Results for orders placed during the hospital encounter of 11/23/23  CT Renal Stone Study  Narrative CLINICAL DATA:  Abdominal/flank pain, stone suspected  EXAM: CT ABDOMEN AND PELVIS WITHOUT CONTRAST  TECHNIQUE: Multidetector CT imaging of the abdomen and pelvis was performed following the standard protocol without IV contrast.  RADIATION DOSE REDUCTION: This exam was performed according to the departmental dose-optimization program which includes automated exposure control, adjustment of the mA and/or kV according to patient size and/or use of iterative reconstruction technique.  COMPARISON:  05/29/2023  FINDINGS: Lower chest: Small pulmonary nodules in the lung bases. The largest is in the right lower lobe measuring 5 mm, series 4, image 5. Nodules are not significantly changed from prior exam allowing for differences in caliper placement. No specific nodule follow-up is needed.  Hepatobiliary: Mild diffuse hepatic steatosis. No evidence of focal liver lesion. Question of subtle capsular nodularity of the liver. Gallstones without gallbladder inflammation. No biliary dilatation.  Pancreas: No ductal dilatation or inflammation.  Spleen: Chronic splenomegaly, the spleen spans 18.4 cm AP, unchanged. Splenule at the hilum.  Adrenals/Urinary Tract: No adrenal nodule. No hydronephrosis or renal calculi. No evidence of focal renal lesion. Nondistended urinary bladder.  Stomach/Bowel: Unremarkable appearance of the stomach. There is no bowel obstruction or inflammation. Diminutive appendix tentatively visualized. Small to moderate colonic stool burden. Occasional colonic diverticula without diverticulitis. Umbilical hernia contains a short segment of small bowel.  Vascular/Lymphatic: Aortic atherosclerosis. No aortic aneurysm. 10 mm periportal node series 2,  image 12, unchanged. Scattered small retroperitoneal lymph nodes not enlarged by size criteria.  Reproductive: Enlarged prostate spans 6.3 cm transverse.  Other: Right retroperitoneal/perarenal soft tissue nodules measuring 13 mm short axis series 2, image 51 and 8 mm short axis series 2, image 52. These are not significantly changed from prior exam. These were not hypermetabolic on intervening PET. No ascites. Small umbilical hernia contains short segment of small bowel. Minimal fat in the inguinal canals.  Musculoskeletal: Postsurgical change at L5-S1. There are no acute or suspicious osseous abnormalities.  IMPRESSION: 1. No acute abnormality in the abdomen/pelvis. No nephrolithiasis or hydronephrosis. 2. Chronic splenomegaly. 3. Hepatic steatosis. Question of subtle capsular nodularity of the liver, can be seen with cirrhosis. 4. Cholelithiasis without gallbladder inflammation. 5. Small umbilical hernia contains a short segment of small bowel. No bowel obstruction or inflammation. 6. Enlarged prostate. 7. Right retroperitoneal/perarenal soft tissue nodules are unchanged from prior, without hypermetabolism on intervening PET.  Aortic Atherosclerosis (ICD10-I70.0).   Electronically Signed By: Andrea Gasman M.D. On: 11/23/2023 16:22   Assessment & Plan:    1. Kidney mass (Primary) CT guided biopsy of right lower pole renal mass - Urinalysis, Routine w reflex microscopic - CT US  GUIDED BIOPSY   No follow-ups on file.  Belvie Clara, MD  [facility] [place]      [1]  Allergies Allergen Reactions   Sulfonamide Derivatives Hives   Zolpidem  Hives   Zolpidem  Tartrate Other (See Comments)   "

## 2024-10-15 NOTE — Progress Notes (Unsigned)
 [name] [name] POUR, MD  [name] Approved for CT CORE BX of RIGHT LP RENAL MASS - mass is atypical and there appear to be satellite nodules in the perinephric fat.    Mod sedation  HKM       Previous Messages    ----- Message ----- From: [name] Sent: 10/15/2024  11:37 AM EST To: Channing CROME Baldwin; Taryn F Rigney, RT; Ir Proc* Subject: CT US  GUIDED BIOPSY                            Procedure :CT US  GUIDED BIOPSY  Reason :right renal mass Dx: Kidney mass [W71.10 (ICD-10-CM)]    History :CT ABDOMEN PELVIS W CONTRAST  Provider:McKenzie, Belvie CROME, MD  Provider contact ;  [phone]

## 2024-10-15 NOTE — Telephone Encounter (Signed)
"  ° °  Pre-operative Risk Assessment    Patient Name: [name] [name]  DOB: 10/04/1955 MRN: [id number]   Date of last office visit: 10/27/23 DR. THUKKANI Date of next office visit: NONE   Request for Surgical Clearance    Procedure:  CT US  GUIDED Bx   Date of Surgery:  Clearance TBD                                Surgeon: NOT LISTED Surgeon's Group or Practice Name:  [facility] Phone number:  [phone] Fax number:  [phone] ATTN: CHANNING MATSU OR TARYN F   Type of Clearance Requested:   - Medical  - Pharmacy:  Hold Rivaroxaban  (Xarelto ) x 2 DOSES PRIOR   Type of Anesthesia:  Not Indicated   Additional requests/questions:    Signed, Ken Bonn   10/15/2024, 3:43 PM   "

## 2024-10-18 NOTE — Telephone Encounter (Signed)
" ° °  Name: [name] [name]  DOB: 05/30/1956  MRN: [id number]  Primary Cardiologist: Danelle Birmingham, MD  Chart reviewed as part of pre-operative protocol coverage. Because of [name] [name]'s past medical history and time since last visit, he will require a follow-up in-office visit in order to better assess preoperative cardiovascular risk.  Pre-op covering staff: - Please schedule appointment and call patient to inform them. If patient already had an upcoming appointment within acceptable timeframe, please add pre-op clearance to the appointment notes so provider is aware. - Please contact requesting surgeon's office via preferred method (i.e, phone, fax) to inform them of need for appointment prior to surgery.  Per Pharm D, patient has not had an Afib/aflutter ablation within the last 3 months, DCCV within the last 4 weeks, or Watchman in the last 45 days. Patient may hold Xarelto  for 2 days prior to procedure.   Patient will not need bridging with Lovenox  around procedure.    Barnie Hila, NP  10/18/2024, 9:20 AM   "

## 2024-10-19 NOTE — Progress Notes (Signed)
 " Patient Care Team: Alben Therisa MATSU, PA as PCP - General (Family Medicine) Waddell Danelle ORN, MD as PCP - Cardiology (Cardiology) Shaaron Lamar HERO, MD as Consulting Physician (Gastroenterology) Celestia Joesph SQUIBB, RN as Oncology Nurse Navigator (Medical Oncology)  Clinic Day:  10/21/2024  Referring physician: Alben Therisa MATSU, PA   CHIEF COMPLAINT:  CC: Right perinephric soft tissue nodularity   [name] [name] 69 y.o. male was transferred to my care after his prior physician has left.   ASSESSMENT & PLAN:   Assessment & Plan: [name] [name]  is a 69 y.o. male with right perinephric soft tissue nodularity  Assessment and Plan Assessment & Plan Renal lesion under evaluation Renal lesion with pending biopsy to determine etiology. Urinalysis showed no hematuria. Management depends on biopsy results; malignancy would prompt staging and surgical consideration.  - Await renal mass biopsy as arranged by radiology. - Review biopsy results to determine subsequent management. - If malignancy is confirmed, order CT chest for staging  - If localized renal malignancy, anticipate partial nephrectomy per urology (Dr. Sherrilee).  Return to clinic after biopsy to discuss further management.    The patient understands the plans discussed today and is in agreement with them.  He knows to contact our office if he develops concerns prior to his next appointment.  25 minutes of total time was spent for this patient encounter, including preparation,review of records,  face-to-face counseling with the patient and coordination of care, physical exam, and documentation of the encounter.    LILLETTE Verneta SAUNDERS Teague,acting as a neurosurgeon for Mickiel Dry, MD.,have documented all relevant documentation on the behalf of Mickiel Dry, MD,as directed by  Mickiel Dry, MD while in the presence of Mickiel Dry, MD.  I, Mickiel Dry MD, have reviewed the above documentation for accuracy and  completeness, and I agree with the above.    Mickiel Dry, MD  [facility]  - A DEPT OF [facility] [address] [place] [address] Dept: [phone] Dept Fax: [phone]   No orders of the defined types were placed in this encounter.    ONCOLOGY HISTORY:   I have reviewed his chart and materials related to his cancer extensively and collaborated history with the patient. Summary of oncologic history is as follows:   Diagnosis: Right perinephric soft tissue nodularity   -05/26/2023: CMP: Creatinine 1.40. Potassium 5.3.  -05/29/2023: CT AP: Two areas of soft tissue nodularity within the right lower abdominal fat measuring roughly 2.3 x 1.8 x 1.9 cm and 1.5 x 1.0 x 1.3 cm. These appear to most likely be within anterior retroperitoneal fat likely just within the inferior aspect of the right perinephric space. These were not present in 2021. Further workup indicated for entities such as lymphoma. A PET scan is recommended for further evaluation. Stable splenomegaly with estimated volume of 1200 mL. Stable mild hepatic steatosis without evidence of overt cirrhosis. Stable cholelithiasis without evidence of biliary ductal dilatation. -06/26/2023: Initial PET: No findings suspicious for lymphoma. Small right perinephric nodules without appreciable hypermetabolism, as described above. Splenomegaly, without focal hypermetabolism. -01/20/2024: CT AP: Stable appearance of the nodular densities within the fat of the anterior right retroperitoneum separate from and inferior to the right lower pole. Overall the lesions appear indeterminate but stable from a prior PET/CT dated 06/26/2023. These could represent areas of fat necrosis. -07/28/2024: CT AP: New 3.4 cm lesion arising from the right lower kidney, suspicious for malignancy. The  appearance is unusual, with differential considerations including renal cell carcinoma, renal  lymphoma, or metastasis. Additional pararenal nodularity, grossly unchanged. While lymphoma is a consideration, this was reportedly non-FDG-avid on prior PET. -08/04/2024: Peripheral Blood Flow Cytometry: No immunophenotypic evidence of a lymphoproliferative disorder (i.e. no monoclonal B cells or immunophenotypically abnormal T cells detected).  -08/04/2024: Lymphoma workup negative. LDH normal. Uric Acid normal. Inflammatory markers normal.  -10/15/2024: Seen by Dr.McKenzie, awaiting on cardiac clearance for for a CT guided biopsy.   Current Treatment:  TBD  INTERVAL HISTORY:   Discussed the use of AI scribe software for clinical note transcription with the patient, who gave verbal consent to proceed.  History of Present Illness [name] [name] is a 69 year old male with a renal mass under evaluation who presents for scan review and ongoing assessment of his kidney mass. He is accompanied by his wife today.   He is undergoing evaluation for a renal mass. Initial MRI was canceled due to the presence of a pacemaker, and a subsequent CT scan was not performed after Medicare declined coverage. A recent CT scan was completed, but a kidney-specific scan remains outstanding. Biopsy of the renal mass is pending. He has not experienced hematuria, and urinalysis on January 30th was normal.  He is awaiting on cardiac clearance for CT guided biopsy   I have reviewed the past medical history, past surgical history, social history and family history with the patient and they are unchanged from previous note.  ALLERGIES:  is allergic to sulfonamide derivatives, zolpidem , and zolpidem  tartrate.  MEDICATIONS:  Current Outpatient Medications  Medication Sig Dispense Refill   allopurinol  (ZYLOPRIM ) 300 MG tablet Take 300 mg by mouth every evening.     amLODipine  (NORVASC ) 2.5 MG tablet Take 1 tablet (2.5 mg total) by mouth at bedtime. 90 tablet 3   colchicine  0.6 MG tablet Take 0.6 mg by mouth daily  as needed (Gout).  0   cyclobenzaprine  (FLEXERIL ) 5 MG tablet Take 5-10 mg by mouth 2 (two) times daily as needed.     fluticasone (FLONASE) 50 MCG/ACT nasal spray Place 2 sprays into both nostrils daily.     isosorbide  mononitrate (IMDUR ) 30 MG 24 hr tablet Take 0.5 tablets (15 mg total) by mouth daily. 45 tablet 3   loratadine  (CLARITIN ) 10 MG tablet Take 10 mg by mouth daily.     Magnesium 300 MG CAPS Take 300 mg by mouth daily.     Melatonin 10 MG SUBL 1 tablet under the tongue and allow to dissolve at bedtime as needed Sublingual at bedtime     metoprolol  succinate (TOPROL -XL) 25 MG 24 hr tablet TAKE 1 TABLET BY MOUTH EVERY DAY. MAY TAKE 1/2 TO 1 TABLET EXTRA FOR PALITATTIONS 60 tablet 11   nitroGLYCERIN  (NITROLINGUAL ) 0.4 MG/SPRAY spray USE 1 SPRAY UNDER THE TONGUE EVERY 5 MINUTES AS NEEDED FOR CHEST PAIN 12 g 3   Omega-3 Fatty Acids (FISH OIL PO) Take 900 mg by mouth daily.     omeprazole  (PRILOSEC) 40 MG capsule Take 1 capsule (40 mg total) by mouth daily. 90 capsule 3   ondansetron  (ZOFRAN ) 4 MG tablet Take 1 tablet (4 mg total) by mouth every 8 (eight) hours as needed for nausea or vomiting. 30 tablet 1   POTASSIUM PO Take 1 tablet by mouth daily.     QUEtiapine  (SEROQUEL ) 300 MG tablet Take 300 mg by mouth at bedtime.     rivaroxaban  (XARELTO ) 20 MG TABS tablet Take 1 tablet (20 mg total)  by mouth daily with supper. 30 tablet    rosuvastatin  (CRESTOR ) 10 MG tablet TAKE 1 TABLET BY MOUTH EVERY DAY 30 tablet 0   silodosin  (RAPAFLO ) 8 MG CAPS capsule Take 1 capsule (8 mg total) by mouth at bedtime. 90 capsule 3   temazepam (RESTORIL) 30 MG capsule Take 30 mg by mouth at bedtime.   5   valsartan -hydrochlorothiazide  (DIOVAN -HCT) 320-25 MG tablet Take 1 tablet by mouth daily.     No current facility-administered medications for this visit.    VITALS:  Blood pressure 127/73, pulse 75, temperature 99.1 F (37.3 C), temperature source Tympanic, resp. rate 18, height 5' 7 (1.702 m), weight  224 lb (101.6 kg), SpO2 100%.  Wt Readings from Last 3 Encounters:  10/20/24 224 lb (101.6 kg)  08/04/24 227 lb (103 kg)  07/12/24 229 lb 15 oz (104.3 kg)    Body mass index is 35.08 kg/m.  Performance status (ECOG): 0 - Asymptomatic  PHYSICAL EXAM:   GENERAL:alert, no distress and comfortable SKIN: skin color, texture, turgor are normal, no rashes or significant lesions EYES: normal, Conjunctiva are pink and non-injected, sclera clear LYMPH:  no palpable lymphadenopathy in the cervical, axillary or inguinal LUNGS: clear to auscultation and percussion with normal breathing effort HEART: regular rate & rhythm and no murmurs and no lower extremity edema ABDOMEN:abdomen soft, non-tender and normal bowel sounds Musculoskeletal:no cyanosis of digits and no clubbing  NEURO: alert & oriented x 3 with fluent speech   LABORATORY DATA:  I have reviewed the data as listed   Lab Results  Component Value Date   WBC 8.3 08/04/2024   NEUTROABS 6.6 08/04/2024   HGB 14.7 08/04/2024   HCT 42.6 08/04/2024   MCV 89.1 08/04/2024   PLT 230 08/04/2024      Chemistry      Component Value Date/Time   NA 138 07/28/2024 1228   NA 140 06/25/2023 1519   K 4.7 07/28/2024 1228   CL 98 07/28/2024 1228   CO2 29 07/28/2024 1228   BUN 20 07/28/2024 1228   BUN 16 06/25/2023 1519   CREATININE 1.31 (H) 07/28/2024 1228   CREATININE 0.92 09/05/2015 1501   GLU 112 (H) 08/28/2023 1206      Component Value Date/Time   CALCIUM  9.8 07/28/2024 1228   ALKPHOS 59 07/28/2024 1228   AST 26 07/28/2024 1228   ALT 27 07/28/2024 1228   BILITOT 0.7 07/28/2024 1228   BILITOT 0.5 11/06/2023 1334       RADIOGRAPHIC STUDIES: I have personally reviewed the radiological images as listed and agreed with the findings in the report.  EXAM: CT ABDOMEN AND PELVIS WITH CONTRAST 07/28/2024 03:32:46 PM   TECHNIQUE: CT of the abdomen and pelvis was performed with the administration of 100 mL of iohexol  (OMNIPAQUE )  300 MG/ML solution. Multiplanar reformatted images are provided for review. Automated exposure control, iterative reconstruction, and/or weight-based adjustment of the mA/kV was utilized to reduce the radiation dose to as low as reasonably achievable.   COMPARISON: CT Abdomen Pelvis 01/20/2024 and PET CT report dated 06/26/2023   CLINICAL HISTORY: Follow up on perinephric nodularity.   FINDINGS:   LOWER CHEST: No acute abnormality.   LIVER: The liver is unremarkable.   GALLBLADDER AND BILE DUCTS: Layering small gallstones, without associated inflammatory changes. No biliary ductal dilatation.   SPLEEN: Stable splenomegaly.   PANCREAS: No acute abnormality.   ADRENAL GLANDS: No acute abnormality.   KIDNEYS, URETERS AND BLADDER: Subcentimeter bilateral renal cysts, benign. No  follow-up is recommended. Additional 3.4 x 2.4 x 2.7 cm perinephric lesion arising from the right lower kidney (image 46), new, with adjacent similar nodularity along the right perirenal space (for example, image 48). No stones in the kidneys or ureters. No hydronephrosis. No perinephric or periureteral stranding. Mildly thick-walled bladder, likely related to chronic bladder obstruction.   GI AND BOWEL: Stomach demonstrates no acute abnormality. There is no bowel obstruction.   PERITONEUM AND RETROPERITONEUM: No ascites. No free air.   VASCULATURE: Aorta is normal in caliber. Atherosclerotic calcifications of the abdominal aorta and branch vessels, although patent.   LYMPH NODES: No lymphadenopathy.   REPRODUCTIVE ORGANS: Prostatomegaly, suggesting BPH.   BONES AND SOFT TISSUES: No acute osseous abnormality. No focal soft tissue abnormality.   IMPRESSION: 1. New 3.4 cm lesion arising from the right lower kidney, suspicious for malignancy. The appearance is unusual, with differential considerations including renal cell carcinoma, renal lymphoma, or metastasis. 2. Additional pararenal  nodularity, grossly unchanged. While lymphoma is a consideration, this was reportedly non-FDG-avid on prior PET.   Electronically signed by: Pinkie Pebbles MD 07/30/2024 11:08 PM EST RP Workstation: [id number]  "

## 2024-10-20 ENCOUNTER — Inpatient Hospital Stay: Attending: Oncology | Admitting: Oncology

## 2024-10-20 VITALS — BP 127/73 | HR 75 | Temp 99.1°F | Resp 18 | Ht 67.0 in | Wt 224.0 lb

## 2024-10-20 DIAGNOSIS — N2889 Other specified disorders of kidney and ureter: Secondary | ICD-10-CM

## 2024-10-20 NOTE — Patient Instructions (Signed)
 [facility] Discharge Instructions   You were seen and examined today by Dr. Davonna.  She reviewed the results of your lab work which are normal/stable.   We will see you back after your biopsy with Dr. Sherrilee to discuss results.   Return as scheduled.    Thank you for choosing [facility] at [facility] to provide your oncology and hematology care.  To afford each patient quality time with our provider, please arrive at least 15 minutes before your scheduled appointment time.   If you have a lab appointment with the Cancer Center please come in thru the Main Entrance and check in at the main information desk.  You need to re-schedule your appointment should you arrive 10 or more minutes late.  We strive to give you quality time with our providers, and arriving late affects you and other patients whose appointments are after yours.  Also, if you no show three or more times for appointments you may be dismissed from the clinic at the providers discretion.     Again, thank you for choosing [facility].  Our hope is that these requests will decrease the amount of time that you wait before being seen by our physicians.       _____________________________________________________________  Should you have questions after your visit to [facility] please contact our office at [phone] and follow the prompts.  Our office hours are 8:00 a.m. and 4:30 p.m. Monday - Friday.  Please note that voicemails left after 4:00 p.m. may not be returned until the following business day.  We are closed weekends and major holidays.  You do have access to a nurse 24-7, just call the main number to the clinic [phone] and do not press any options, hold on the line and a nurse will answer the phone.    For prescription refill requests, have your pharmacy contact our office and allow 72 hours.    Due to Covid, you will  need to wear a mask upon entering the hospital. If you do not have a mask, a mask will be given to you at the Main Entrance upon arrival. For doctor visits, patients may have 1 support person age 58 or older with them. For treatment visits, patients can not have anyone with them due to social distancing guidelines and our immunocompromised population.

## 2024-11-11 ENCOUNTER — Ambulatory Visit: Admitting: General Practice

## 2024-11-24 ENCOUNTER — Inpatient Hospital Stay: Attending: Oncology | Admitting: Oncology

## 2025-02-21 ENCOUNTER — Ambulatory Visit: Admitting: Urology
